# Patient Record
Sex: Female | Born: 1935 | Race: White | Hispanic: No | Marital: Single | Smoking: Former smoker
Health system: Southern US, Community
[De-identification: ages and names within clinical notes are randomized; demographics above are authoritative.]

## PROBLEM LIST (undated history)

## (undated) DIAGNOSIS — I209 Angina pectoris, unspecified: Secondary | ICD-10-CM

## (undated) DIAGNOSIS — R0600 Dyspnea, unspecified: Secondary | ICD-10-CM

## (undated) DIAGNOSIS — M1990 Unspecified osteoarthritis, unspecified site: Secondary | ICD-10-CM

## (undated) DIAGNOSIS — E079 Disorder of thyroid, unspecified: Secondary | ICD-10-CM

## (undated) DIAGNOSIS — I499 Cardiac arrhythmia, unspecified: Secondary | ICD-10-CM

## (undated) DIAGNOSIS — I10 Essential (primary) hypertension: Secondary | ICD-10-CM

## (undated) DIAGNOSIS — D649 Anemia, unspecified: Secondary | ICD-10-CM

## (undated) DIAGNOSIS — E039 Hypothyroidism, unspecified: Secondary | ICD-10-CM

## (undated) DIAGNOSIS — C801 Malignant (primary) neoplasm, unspecified: Secondary | ICD-10-CM

## (undated) DIAGNOSIS — I509 Heart failure, unspecified: Secondary | ICD-10-CM

## (undated) HISTORY — PX: FRACTURE SURGERY: SHX138

## (undated) HISTORY — PX: TONSILLECTOMY: SUR1361

## (undated) HISTORY — PX: CATARACT EXTRACTION, BILATERAL: SHX1313

## (undated) HISTORY — PX: JOINT REPLACEMENT: SHX530

## (undated) HISTORY — PX: HIP SURGERY: SHX245

---

## 2000-09-13 ENCOUNTER — Ambulatory Visit (HOSPITAL_COMMUNITY): Admission: RE | Admit: 2000-09-13 | Discharge: 2000-09-13 | Payer: Self-pay | Admitting: Internal Medicine

## 2000-09-24 ENCOUNTER — Inpatient Hospital Stay (HOSPITAL_COMMUNITY): Admission: EM | Admit: 2000-09-24 | Discharge: 2000-09-26 | Payer: Self-pay | Admitting: Infectious Diseases

## 2000-10-04 ENCOUNTER — Encounter: Admission: RE | Admit: 2000-10-04 | Discharge: 2000-10-04 | Payer: Self-pay | Admitting: Internal Medicine

## 2004-05-26 ENCOUNTER — Ambulatory Visit: Payer: Self-pay | Admitting: Physical Medicine & Rehabilitation

## 2004-05-26 ENCOUNTER — Inpatient Hospital Stay (HOSPITAL_COMMUNITY)
Admission: RE | Admit: 2004-05-26 | Discharge: 2004-06-01 | Payer: Self-pay | Admitting: Physical Medicine & Rehabilitation

## 2009-01-10 ENCOUNTER — Emergency Department (HOSPITAL_COMMUNITY): Admission: EM | Admit: 2009-01-10 | Discharge: 2009-01-10 | Payer: Self-pay | Admitting: Emergency Medicine

## 2010-06-04 NOTE — Discharge Summary (Signed)
[place]. [facility]  Patient:    Kathleen Arias, Kathleen Arias Visit Number: [id number] MRN: [id number]          Service Type: MED Location: 2000 2031 01 Attending Physician:  Levy Sjogren Dictated by:   Kerrie Pleasure, M.D. Admit Date:  09/24/2000 Discharge Date: 09/26/2000   CC:         Juluis Mire, M.D.   Discharge Summary  DISCHARGE DIAGNOSIS:  Seizure with deep venous thrombosis.  DISCHARGE MEDICATIONS: 1. Synthroid 0.2 mg q.d. 2. Coumadin 5 mg q.d. 3. Aspirin 325 mg q.d.  DISCHARGE FOLLOWUP:  Patient was discharged in good health.  She was continued on her Coumadin 5 mg daily, which she came into the hospital with.  She was discharged to continue care with primary care physician, Dr. Ellene Route, who will continue checking her INR and PTT and will adjust her Coumadin levels accordingly.  She was also discharged to have a followup with me in the outpatient clinic.  PROCEDURES PERFORMED:  None.  CONSULTATIONS:  None.  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  She has been previously until admission week when she had left knee pain and swelling, which was said to have been diagnosed as a DVT.  She was placed on Coumadin and Lovenox by Dr. Ellene Route.  She completed the Lovenox on Friday, two days prior to admission, but still was on Coumadin.  She has also been hypothyroid and on 0.12 of Synthroid x 1964.  Her Synthroid dose was doubled a week before admission.  Her episode on the day of admission lasted three minutes and was described by family members as convulsive, with loss of expression and pulse.  CPR was performed by bystanders in church and patient was disoriented post episode. The patient had no idea of what happened.  She had no biting of tongue, no frothy mouth, no  loss of bladder or rectal tone, no fever, no diaphoresis, and no chest pain.  No nausea and vomiting.  The patient admitted to being under a lot of stress lately.  PAST MEDICAL HISTORY:  Her past medical history is only significant for hypothyroidism since 1964 and the DVT a week prior to admission.  ALLERGIES:  No known drug allergies.  MEDICATIONS:  Her medications are basically: 1. Synthroid 0.2 mg q.d. 2. Coumadin 5 mg daily. 3. Aspirin 325 mg daily.  FAMILY HISTORY:  Her mom died at the age of 11 with MI secondary to rheumatic heart disease.  Her dad died of natural causes.  Her husband had cancer.  Her son is ______ .  She has four kids, eight grandchildren, and 12 great-grandchildren.  SOCIAL HISTORY:  She denied any alcohol, tobacco, or drug use.  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  She is retired, but she has been very active in the community.  REVIEW OF SYSTEMS:  Noncontributory, except for slight discoloration at the site of Lovenox injection on her abdomen.  PHYSICAL EXAMINATION:  VITAL SIGNS:  Her vitals on admission, temperature 97.9, blood pressure 176/78, pulse 92, respirations 18, O2 saturation 94 on room air.  The vitals as recorded by EMS before transportation to hospital, blood pressure 190/110, pulse 86, respirations 16.  GENERAL:  On examination, she is generally a pleasant elderly female, no in obvious pain or distress.  She is well  informed on medical language.  HEENT:  PERRLA.  No pallor or icterus.  CHEST EXAM:  Clear to auscultation symmetrical.  CARDIOVASCULAR:  Heart sounds S1 and S2 are normal.  ABDOMEN:  She had soft, ______ in both flanks.  She had soft abdomen with positive bowel sounds.  EXTREMITIES:  She had some superficial veins in the bilateral left lower extremity, but no swelling of joints or soft tissue.  She had no calf tenderness or edema.  She had positive DP pulses bilaterally.  NEUROLOGICAL:  Cranial nerves 2-12 were  intact.  Normal cerebellar function. No focal neurological deficits detected.  LABORATORY DATA:  Her labs on admission showed CK 56, CK-MB 1.4, and troponin of 0.02.  She also had PT of 23.3, INR 2.5, PTT 48.  Her EKG showed left bundle branch block with left axis deviation.  Chest spiral CT was negative and showed that there was no DVT or PE.  Her white count was 7.2, platelets 361, hemoglobin 13.  HOSPITAL COURSE: #1 - LOSS OF CONSCIOUSNESS:  The patient presented with loss of consciousness or syncope, which was thought to be vasovagal based on the history and physical.  She did not have any repeat episodes during hospitalization. Considered other differentials including MI, CNS tumor, or metabolic causes. For the MI, we ordered serial enzymes on patient which all turned out negative x 3.  We ordered a 2-D echocardiogram and EKG telemonitoring.  Her echocardiogram came back also negative with EF of 55-65% and normal cardiac findings.  #2 - HYPOTHYROIDISM:  The patient has been taking her Synthroid since 1964 and she just had her dose of Synthroid increased.  We checked a TSH level while in the hospital and her TSH level was 3.228, which is normal.  #3 - HYPERTENSION:  She also had a history of hypertension, probably isolated systolic hypertension on admission. This went up to 170/70.  We did not give her any medication for that because that resolved and went down to 156/70 on the day of discharge, so we did not treat that.  During the course of hospitalization, the patient did very well.  A Doppler ultrasound of the lower extremities also showed no DVT.  As such, the patient was discharged in good health with the following discharge laboratories:  On the day of discharge, she had a PT of 24.7, INR of 2.9.  The patient also had a white count of 7.8, hemoglobin 13.2, platelets 379.  She had a sodium of  1.24, potassium 4.2, chloride 111, CO2 27, BUN 8, creatinine 0.7, and  glucose of 95 on the day of discharge.  The patient was discharged to have followup visit to Dr. Ellene Route. Dictated by:   Kerrie Pleasure, M.D. Attending Physician:  Levy Sjogren DD:  11/02/00 TD:  11/04/00 Job: [id number]

## 2010-06-04 NOTE — H&P (Signed)
[name], [name] NO.:  [id number]   MEDICAL RECORD NO.:  [id number]          PATIENT TYPE:  IPS   LOCATION:  4008                         FACILITY:  [facility]   PHYSICIAN:  Ranelle Oyster, M.D.DATE OF BIRTH:  01-11-1926   DATE OF ADMISSION:  05/26/2004  DATE OF DISCHARGE:                                HISTORY & PHYSICAL   CHIEF COMPLAINT:  Left hip pain.   HISTORY OF PRESENT ILLNESS:  This is a 77 year old white female vacationing  in [place] when she fell sustaining a left subcapital hip fracture.  She had a left hip pinning performed in a hospital in [facility] on May 22, 2004.  She is weight-bearing as tolerated.  She was placed on Coumadin for  DVT prophylaxis.  The patient generally appears to be progressing nicely but  not to the level where she can go home independently.  The patient is noted  to have some postoperative diarrhea felt possibly due to antibiotics.  She  also has lactose intolerance.   REVIEW OF SYMPTOMS:  Right biceps pain and soreness, bruising over the left  hip with improvement of late.  The patient reports diarrhea and mild  anxiety.   PAST MEDICAL HISTORY:  Positive for hypothyroidism, hypertension, syncope  due to vasovagal episode in 2002, history of left lower extremity DVT.   FAMILY HISTORY:  Noncontributory.   SOCIAL HISTORY:  The patient lives alone and has a local family who can  assist.  She is independent prior to arrival.   FUNCTIONAL HISTORY:  The patient is independent, currently, she is contact  guard to min assist for transfers and ambulating 175 feet with a rolling  walker.   MEDICATIONS PRIOR TO ARRIVAL:  Synthroid 200 mcg daily, aspirin 81 mg daily,  multi-vitamin daily.   ALLERGIES:  Benadryl.   LABORATORY DATA:  Hemoglobin 12.2, white count 11.3, sodium 147, potassium  3.7, BUN 12, creatinine 0.6.   PHYSICAL EXAMINATION:  VITAL SIGNS:  Blood pressure 120/47, pulse 84, respiratory rate 16.  GENERAL:  The patient is pleasant and appropriate.  HEENT:  Pupils equal, round, reactive to light and accommodation.  Extraocular movements intact.  Oral mucosa is pink and moist.  NECK:  Supple without JVD or adenopathy.  CHEST:  Clear to auscultation bilaterally.  HEART:  Regular rate and rhythm without murmurs, gallops, and rubs.  ABDOMEN:  Soft, nontender, bowel sounds positive.  EXTREMITIES:  Left hip had notable bruising but is without drainage, clean  and intact.  Staples are in place.  Right biceps was slightly tender after  blood pressure reading today due to the cuff being tight.  There was no  bruising or skin color changes noted.  The patient had 5/5 strength in both  upper extremities.  The left lower extremity was 2/5, distal lower extremity  4+/5.  Right lower extremity was 5/5 throughout.  Sensory exam was grossly  intact.  SKIN:  Appropriate in the wound area mentioned above.  NEUROLOGICAL:  Reflexes 2+.  Sensory intact.  Cognitively, the patient is  appropriate.   ASSESSMENT/PLAN:  1.  Functional deficit secondary to left subcapital femur fracture and left      hip hemiarthroplasty.  The patient is postoperative day five. Begin      comprehensive inpatient rehab.  Goals are modified independent.      Estimated length of stay is five days.  Prognosis good.  2.  Pain management.  P.r.n. Vicodin and Robaxin.  Apply moist heat to right      biceps.  3.  DVT prophylaxis with Coumadin.  4.  Diarrhea most likely due to lactose intolerance, provide lactate with      dairy products, check C. diff in stool, may be also related to      antibiotics which have now been discontinued.  The patient has done well      in this area today.  5.  Hypothyroidism, continue Synthroid.  6.  Anemia, continue iron supplement daily.      ZTS/MEDQ  D:  05/26/2004  T:  05/26/2004  Job:  [id number]

## 2010-06-04 NOTE — Discharge Summary (Signed)
NAMEPAULLETTE, Kathleen Arias NO.:  [id number]   MEDICAL RECORD NO.:  [id number]          PATIENT TYPE:  IPS   LOCATION:  4008                         FACILITY:  [facility]   PHYSICIAN:  Ellwood Dense, M.D.   DATE OF BIRTH:  1925/03/13   DATE OF ADMISSION:  05/26/2004  DATE OF DISCHARGE:  06/01/2004                                 DISCHARGE SUMMARY   DISCHARGE DIAGNOSES:  1.  Left subcapital femur fracture requiring left hip hemiarthroplasty.  2.  Hypothyroidism.  3.  Postoperative anemia, improving.   HISTORY OF PRESENT ILLNESS:  Ms. Kathleen Arias is a 77 year old female with a  history of hypothyroidism otherwise in good health was in [place]  dancing when she fell, on May 21, 2004, sustaining a left subcapital femur  fracture.  She was transferred to [facility] and underwent left  hip hemiarthroplasty by Dr. Karen Kitchens on May 22, 2004.  Post-op is weightbearing  as tolerated and was placed on Coumadin for DVT prophylaxis.  She was noted  to have post-op anemia past surgery and was placed on iron supplement.  She  was also noted to be hypertensive and was started on a low dose beta-  blocker.  Therapies were initiated and the patient noted to be at contact  guard to min assist for transfers, contact guard assist for ambulating 25  feet with a walker.  __________ was consulted progression.   PAST MEDICAL HISTORY:  1.  Hypothyroidism.  2.  Hypertension.  3.  Syncope due to vasovagal episode.  4.  History of DVT in the left lower extremity .   ALLERGIES:  BENADRYL.   SOCIAL HISTORY:  The patient lives alone, was independent prior to  admission, does not use any tobacco or alcohol.   HOSPITAL COURSE:  Ms. Kathleen Arias was admitted to rehab, on May 26, 2004,  for inpatient therapies to consist of PT OT daily.  Past admission she was  maintained on Coumadin for DVT prophylaxis.  Pain control was managed with  the use of p.r.n. Vicodin and Robaxin.  She did report  some diarrhea,  however, question secondary to antibiotics with the iron supplement.  Iron  supplement was D/C'd, on May 27, 2004, due to the patient's refusal to take  it and also as post-op anemia was noted to be improving.  CBC, of May 27, 2004, which revealed a hemoglobin 11.0, hematocrit 31.3, white count 6.0,  platelets 397.  A check of labs reveals some hypokalemia with potassium at  2.9.  The patient's potassium was supplemented and recheck labs, of May 30, 2004, showed resolution with sodium 141, potassium 4.1, chloride 106, CO2  31, BUN 8, creatinine 0.8, glucose 97.  A UA/UCS was done past admission  showing 30,000 colonies of E. coli and Pseudomonas aeruginosa as patient  with hardware she was treated with three day course of Cipro for this.  The  patient's hip incision was monitored along and was noted to be healing well  without any signs or symptoms of infection.  No drainage or erythema noted.  The patient's  mood was stable.  Her high levels of anxiety improved by the  time of discharge.  During her stay at rehab, the patient progressed to  being at modified independent level for ADLs and toileting.  She was  modified independent level for transfers.  Modified independent level for  ambulating 150 feet with standard walker.   Further followup therapies to include:  1.  Home health PT by [facility].  2.  Home health RN was arranged to draw pro time with Coumadin to continue      through June 22, 2004.  3.  The patient and family report they will follow up with orthopedist in      town as opposed to returning back to [place].  They will decide      on an orthopedist depending on the medical doctor's input.  They are      advised to follow up with an orthopedist in 2-3 weeks for recheck.  On      Jun 01, 2004, the patient is discharged to home.   DISCHARGE MEDICATIONS:  1.  Coumadin 2 mg two p.o. q.p.m.  2.  Synthroid 200 mcg a day.  3.  Toprol XL 25 mg a  day.  4.  Vicodin 1-2 q.4-6h. p.r.n. pain.  5.  Cipro 250 mg b.i.d.   DIET:  Regular.   WOUND CARE:  Keep area clean and dry.  Wash with soap and water.   SPECIAL INSTRUCTIONS:  1.  No alcohol, no tobacco, no driving.  2.  Follow up with an orthopedist in 2-3 weeks.  3.  Follow total hip precautions and use the walker for ambulation.  4.  [facility] to provide PT and RN.   FOLLOWUP:  1.  The patient is to follow up with Dr. Andrey Campanile for a routine check.  2.  Follow up with Dr. Lamar Arias as needed.      PP/MEDQ  D:  06/02/2004  T:  06/02/2004  Job:  [id number]   cc:   Gloriajean Dell. Andrey Campanile, M.D.  [id number]  [place]  [place] [address]  Fax: [phone]   Dr. Jackalyn Lombard [place]

## 2010-08-21 ENCOUNTER — Emergency Department (HOSPITAL_COMMUNITY): Payer: Medicare Other

## 2010-08-21 ENCOUNTER — Emergency Department (HOSPITAL_COMMUNITY)
Admission: EM | Admit: 2010-08-21 | Discharge: 2010-08-21 | Disposition: A | Discharge status: Home / Self Care | Payer: Medicare Other | Attending: Emergency Medicine | Admitting: Emergency Medicine

## 2010-08-21 DIAGNOSIS — S0990XA Unspecified injury of head, initial encounter: Secondary | ICD-10-CM | POA: Insufficient documentation

## 2010-08-21 DIAGNOSIS — S0100XA Unspecified open wound of scalp, initial encounter: Secondary | ICD-10-CM | POA: Insufficient documentation

## 2010-08-21 DIAGNOSIS — W1809XA Striking against other object with subsequent fall, initial encounter: Secondary | ICD-10-CM | POA: Insufficient documentation

## 2010-08-21 DIAGNOSIS — R21 Rash and other nonspecific skin eruption: Secondary | ICD-10-CM | POA: Insufficient documentation

## 2010-08-21 DIAGNOSIS — R1110 Vomiting, unspecified: Secondary | ICD-10-CM | POA: Insufficient documentation

## 2010-08-21 DIAGNOSIS — E039 Hypothyroidism, unspecified: Secondary | ICD-10-CM | POA: Insufficient documentation

## 2010-08-21 DIAGNOSIS — L509 Urticaria, unspecified: Secondary | ICD-10-CM | POA: Insufficient documentation

## 2010-08-21 DIAGNOSIS — F411 Generalized anxiety disorder: Secondary | ICD-10-CM | POA: Insufficient documentation

## 2010-08-21 DIAGNOSIS — Z79899 Other long term (current) drug therapy: Secondary | ICD-10-CM | POA: Insufficient documentation

## 2010-08-21 DIAGNOSIS — L298 Other pruritus: Secondary | ICD-10-CM | POA: Insufficient documentation

## 2010-08-21 DIAGNOSIS — I10 Essential (primary) hypertension: Secondary | ICD-10-CM | POA: Insufficient documentation

## 2010-08-21 DIAGNOSIS — L2989 Other pruritus: Secondary | ICD-10-CM | POA: Insufficient documentation

## 2010-08-21 LAB — DIFFERENTIAL
Basophils Absolute: 0 10*3/uL (ref 0.0–0.1)
Basophils Relative: 0 % (ref 0–1)
Eosinophils Absolute: 0 10*3/uL (ref 0.0–0.7)
Eosinophils Relative: 0 % (ref 0–5)
Lymphocytes Relative: 6 % — ABNORMAL LOW (ref 12–46)
Lymphs Abs: 0.7 10*3/uL (ref 0.7–4.0)
Monocytes Absolute: 0.4 10*3/uL (ref 0.1–1.0)
Monocytes Relative: 3 % (ref 3–12)
Neutro Abs: 11.7 10*3/uL — ABNORMAL HIGH (ref 1.7–7.7)
Neutrophils Relative %: 91 % — ABNORMAL HIGH (ref 43–77)

## 2010-08-21 LAB — POCT I-STAT, CHEM 8
Calcium, Ion: 1.22 mmol/L (ref 1.12–1.32)
Chloride: 98 mEq/L (ref 96–112)
HCT: 43 % (ref 36.0–46.0)
TCO2: 25 mmol/L (ref 0–100)

## 2010-08-21 LAB — URINALYSIS, ROUTINE W REFLEX MICROSCOPIC
Ketones, ur: NEGATIVE mg/dL
Nitrite: NEGATIVE
Protein, ur: NEGATIVE mg/dL
Urobilinogen, UA: 0.2 mg/dL (ref 0.0–1.0)
pH: 6 (ref 5.0–8.0)

## 2010-08-21 LAB — CBC
MCV: 80.6 fL (ref 78.0–100.0)
Platelets: 263 10*3/uL (ref 150–400)
RDW: 13.5 % (ref 11.5–15.5)
WBC: 12.8 10*3/uL — ABNORMAL HIGH (ref 4.0–10.5)

## 2010-08-22 LAB — URINE CULTURE
Colony Count: 85000
Culture  Setup Time: 201208041755

## 2016-01-05 ENCOUNTER — Observation Stay (HOSPITAL_BASED_OUTPATIENT_CLINIC_OR_DEPARTMENT_OTHER): Payer: Medicare Other

## 2016-01-05 ENCOUNTER — Inpatient Hospital Stay (HOSPITAL_COMMUNITY)
Admission: EM | Admit: 2016-01-05 | Discharge: 2016-01-08 | DRG: 287 | Disposition: A | Discharge status: Home / Self Care | Payer: Medicare Other | Attending: Internal Medicine | Admitting: Internal Medicine

## 2016-01-05 ENCOUNTER — Emergency Department (HOSPITAL_COMMUNITY): Payer: Medicare Other

## 2016-01-05 ENCOUNTER — Encounter (HOSPITAL_COMMUNITY): Payer: Self-pay | Admitting: Emergency Medicine

## 2016-01-05 DIAGNOSIS — I509 Heart failure, unspecified: Secondary | ICD-10-CM | POA: Diagnosis not present

## 2016-01-05 DIAGNOSIS — E876 Hypokalemia: Secondary | ICD-10-CM | POA: Diagnosis not present

## 2016-01-05 DIAGNOSIS — Z23 Encounter for immunization: Secondary | ICD-10-CM

## 2016-01-05 DIAGNOSIS — R0789 Other chest pain: Secondary | ICD-10-CM | POA: Diagnosis not present

## 2016-01-05 DIAGNOSIS — R609 Edema, unspecified: Secondary | ICD-10-CM | POA: Diagnosis not present

## 2016-01-05 DIAGNOSIS — Z7982 Long term (current) use of aspirin: Secondary | ICD-10-CM

## 2016-01-05 DIAGNOSIS — E162 Hypoglycemia, unspecified: Secondary | ICD-10-CM | POA: Diagnosis present

## 2016-01-05 DIAGNOSIS — R071 Chest pain on breathing: Secondary | ICD-10-CM | POA: Diagnosis not present

## 2016-01-05 DIAGNOSIS — D649 Anemia, unspecified: Secondary | ICD-10-CM | POA: Diagnosis not present

## 2016-01-05 DIAGNOSIS — Z87891 Personal history of nicotine dependence: Secondary | ICD-10-CM

## 2016-01-05 DIAGNOSIS — R739 Hyperglycemia, unspecified: Secondary | ICD-10-CM

## 2016-01-05 DIAGNOSIS — I110 Hypertensive heart disease with heart failure: Secondary | ICD-10-CM | POA: Diagnosis present

## 2016-01-05 DIAGNOSIS — T502X5A Adverse effect of carbonic-anhydrase inhibitors, benzothiadiazides and other diuretics, initial encounter: Secondary | ICD-10-CM | POA: Diagnosis present

## 2016-01-05 DIAGNOSIS — R0602 Shortness of breath: Secondary | ICD-10-CM

## 2016-01-05 DIAGNOSIS — M7989 Other specified soft tissue disorders: Secondary | ICD-10-CM

## 2016-01-05 DIAGNOSIS — I5042 Chronic combined systolic (congestive) and diastolic (congestive) heart failure: Secondary | ICD-10-CM | POA: Diagnosis present

## 2016-01-05 DIAGNOSIS — I428 Other cardiomyopathies: Secondary | ICD-10-CM | POA: Diagnosis present

## 2016-01-05 DIAGNOSIS — I447 Left bundle-branch block, unspecified: Secondary | ICD-10-CM | POA: Diagnosis present

## 2016-01-05 DIAGNOSIS — R079 Chest pain, unspecified: Secondary | ICD-10-CM | POA: Diagnosis present

## 2016-01-05 DIAGNOSIS — E871 Hypo-osmolality and hyponatremia: Secondary | ICD-10-CM

## 2016-01-05 DIAGNOSIS — E039 Hypothyroidism, unspecified: Secondary | ICD-10-CM | POA: Diagnosis present

## 2016-01-05 DIAGNOSIS — Z79899 Other long term (current) drug therapy: Secondary | ICD-10-CM

## 2016-01-05 DIAGNOSIS — D509 Iron deficiency anemia, unspecified: Secondary | ICD-10-CM

## 2016-01-05 DIAGNOSIS — R9439 Abnormal result of other cardiovascular function study: Secondary | ICD-10-CM

## 2016-01-05 DIAGNOSIS — I5181 Takotsubo syndrome: Secondary | ICD-10-CM | POA: Diagnosis not present

## 2016-01-05 DIAGNOSIS — M7918 Myalgia, other site: Secondary | ICD-10-CM

## 2016-01-05 DIAGNOSIS — M791 Myalgia: Secondary | ICD-10-CM

## 2016-01-05 HISTORY — DX: Essential (primary) hypertension: I10

## 2016-01-05 HISTORY — DX: Disorder of thyroid, unspecified: E07.9

## 2016-01-05 HISTORY — DX: Unspecified osteoarthritis, unspecified site: M19.90

## 2016-01-05 LAB — BASIC METABOLIC PANEL
ANION GAP: 10 (ref 5–15)
Anion gap: 8 (ref 5–15)
BUN: 13 mg/dL (ref 6–20)
BUN: 9 mg/dL (ref 6–20)
CALCIUM: 9.3 mg/dL (ref 8.9–10.3)
CHLORIDE: 91 mmol/L — AB (ref 101–111)
CHLORIDE: 91 mmol/L — AB (ref 101–111)
CO2: 25 mmol/L (ref 22–32)
CO2: 28 mmol/L (ref 22–32)
CREATININE: 0.74 mg/dL (ref 0.44–1.00)
CREATININE: 0.84 mg/dL (ref 0.44–1.00)
Calcium: 8.8 mg/dL — ABNORMAL LOW (ref 8.9–10.3)
GFR calc Af Amer: >60 mL/min (ref >60)
GFR calc non Af Amer: >60 mL/min (ref >60)
GFR calc non Af Amer: >60 mL/min (ref >60)
GLUCOSE: 121 mg/dL — AB (ref 65–99)
Glucose, Bld: 131 mg/dL — ABNORMAL HIGH (ref 65–99)
Potassium: 4 mmol/L (ref 3.5–5.1)
Potassium: 3.9 mmol/L (ref 3.5–5.1)
SODIUM: 126 mmol/L — AB (ref 135–145)
SODIUM: 127 mmol/L — AB (ref 135–145)

## 2016-01-05 LAB — CBC
HCT: 31.5 % — ABNORMAL LOW (ref 36.0–46.0)
HEMOGLOBIN: 10.1 g/dL — AB (ref 12.0–15.0)
MCH: 25.1 pg — AB (ref 26.0–34.0)
MCHC: 32.1 g/dL (ref 30.0–36.0)
MCV: 78.2 fL (ref 78.0–100.0)
PLATELETS: 154 10*3/uL (ref 150–400)
RBC: 4.03 MIL/uL (ref 3.87–5.11)
RDW: 15.9 % — ABNORMAL HIGH (ref 11.5–15.5)
WBC: 11.6 10*3/uL — AB (ref 4.0–10.5)

## 2016-01-05 LAB — I-STAT TROPONIN, ED: TROPONIN I, POC: 0 ng/mL (ref 0.00–0.08)

## 2016-01-05 LAB — ECHOCARDIOGRAM COMPLETE
HEIGHTINCHES: 63 in
Weight: 2227.2 oz

## 2016-01-05 LAB — TROPONIN I
Troponin I: <0.03 ng/mL (ref <0.03)
Troponin I: <0.03 ng/mL (ref <0.03)

## 2016-01-05 LAB — GLUCOSE, CAPILLARY: GLUCOSE-CAPILLARY: 87 mg/dL (ref 65–99)

## 2016-01-05 LAB — TSH: TSH: 3.156 u[IU]/mL (ref 0.350–4.500)

## 2016-01-05 LAB — BRAIN NATRIURETIC PEPTIDE: B Natriuretic Peptide: 450.1 pg/mL — ABNORMAL HIGH (ref 0.0–100.0)

## 2016-01-05 MED ORDER — HEPARIN SODIUM (PORCINE) 5000 UNIT/ML IJ SOLN
5000.0000 [IU] | Freq: Three times a day (TID) | INTRAMUSCULAR | Status: DC
  Administered 2016-01-05 – 2016-01-07 (×4): 5000 [IU] via SUBCUTANEOUS
  Filled 2016-01-05 (×4): qty 1

## 2016-01-05 MED ORDER — POTASSIUM 99 MG PO TABS: 99.0000 mg | ORAL_TABLET | ORAL | Status: DC

## 2016-01-05 MED ORDER — FUROSEMIDE 10 MG/ML IJ SOLN
20.0000 mg | Freq: Once | INTRAMUSCULAR | Status: AC
  Administered 2016-01-05: 20 mg via INTRAVENOUS
  Filled 2016-01-05: qty 2

## 2016-01-05 MED ORDER — PERFLUTREN LIPID MICROSPHERE
1.0000 mL | INTRAVENOUS | Status: AC | PRN
  Administered 2016-01-05: 3 mL via INTRAVENOUS
  Filled 2016-01-05: qty 10

## 2016-01-05 MED ORDER — GI COCKTAIL ~~LOC~~: 30.0000 mL | Freq: Four times a day (QID) | ORAL | Status: DC | PRN

## 2016-01-05 MED ORDER — HEPARIN SODIUM (PORCINE) 5000 UNIT/ML IJ SOLN: 5000.0000 [IU] | Freq: Three times a day (TID) | INTRAMUSCULAR | Status: DC

## 2016-01-05 MED ORDER — ACETAMINOPHEN 650 MG RE SUPP: 650.0000 mg | Freq: Four times a day (QID) | RECTAL | Status: DC | PRN

## 2016-01-05 MED ORDER — INFLUENZA VAC SPLIT QUAD 0.5 ML IM SUSY
0.5000 mL | PREFILLED_SYRINGE | INTRAMUSCULAR | Status: AC
  Administered 2016-01-06: 0.5 mL via INTRAMUSCULAR
  Filled 2016-01-05: qty 0.5

## 2016-01-05 MED ORDER — ONDANSETRON HCL 4 MG/2ML IJ SOLN: 4.0000 mg | Freq: Four times a day (QID) | INTRAMUSCULAR | Status: DC | PRN

## 2016-01-05 MED ORDER — POLYVINYL ALCOHOL 1.4 % OP SOLN
1.0000 [drp] | OPHTHALMIC | Status: DC | PRN
  Filled 2016-01-05: qty 15

## 2016-01-05 MED ORDER — NITROGLYCERIN 0.4 MG SL SUBL: 0.4000 mg | SUBLINGUAL_TABLET | SUBLINGUAL | Status: DC | PRN

## 2016-01-05 MED ORDER — ASPIRIN EC 325 MG PO TBEC
325.0000 mg | DELAYED_RELEASE_TABLET | Freq: Every day | ORAL | Status: DC
  Administered 2016-01-06 – 2016-01-08 (×2): 325 mg via ORAL
  Filled 2016-01-05 (×3): qty 1

## 2016-01-05 MED ORDER — LEVOTHYROXINE SODIUM 25 MCG PO TABS
125.0000 ug | ORAL_TABLET | ORAL | Status: DC
  Administered 2016-01-06 – 2016-01-08 (×2): 125 ug via ORAL
  Filled 2016-01-05 (×3): qty 1

## 2016-01-05 MED ORDER — NITROGLYCERIN 2 % TD OINT
1.0000 [in_us] | TOPICAL_OINTMENT | Freq: Once | TRANSDERMAL | Status: AC
  Administered 2016-01-05: 1 [in_us] via TOPICAL
  Filled 2016-01-05 (×2): qty 1

## 2016-01-05 MED ORDER — ASPIRIN 81 MG PO CHEW
324.0000 mg | CHEWABLE_TABLET | Freq: Once | ORAL | Status: AC
  Administered 2016-01-05: 324 mg via ORAL
  Filled 2016-01-05: qty 4

## 2016-01-05 MED ORDER — ONDANSETRON HCL 4 MG PO TABS: 4.0000 mg | ORAL_TABLET | Freq: Four times a day (QID) | ORAL | Status: DC | PRN

## 2016-01-05 MED ORDER — DICLOFENAC SODIUM 75 MG PO TBEC
75.0000 mg | DELAYED_RELEASE_TABLET | Freq: Two times a day (BID) | ORAL | Status: DC | PRN
  Filled 2016-01-05: qty 1

## 2016-01-05 MED ORDER — ACETAMINOPHEN 325 MG PO TABS: 650.0000 mg | ORAL_TABLET | ORAL | Status: DC | PRN

## 2016-01-05 MED ORDER — LEVOTHYROXINE SODIUM 112 MCG PO TABS
112.0000 ug | ORAL_TABLET | ORAL | Status: DC
Start: 2016-01-07 — End: 2016-01-08
  Administered 2016-01-07: 112 ug via ORAL
  Filled 2016-01-05 (×2): qty 1

## 2016-01-05 MED ORDER — LOSARTAN POTASSIUM 25 MG PO TABS
25.0000 mg | ORAL_TABLET | Freq: Every day | ORAL | Status: DC
  Administered 2016-01-05 – 2016-01-08 (×4): 25 mg via ORAL
  Filled 2016-01-05 (×4): qty 1

## 2016-01-05 MED ORDER — SODIUM CHLORIDE 0.9 % IV SOLN
INTRAVENOUS | Status: DC
  Administered 2016-01-05: 17:00:00 via INTRAVENOUS

## 2016-01-05 MED ORDER — REGADENOSON 0.4 MG/5ML IV SOLN
0.4000 mg | Freq: Once | INTRAVENOUS | Status: AC
  Administered 2016-01-06: 0.4 mg via INTRAVENOUS
  Filled 2016-01-05: qty 5

## 2016-01-05 MED ORDER — ACETAMINOPHEN 325 MG PO TABS: 650.0000 mg | ORAL_TABLET | Freq: Four times a day (QID) | ORAL | Status: DC | PRN

## 2016-01-05 NOTE — ED Notes (Signed)
Family at bedside. 

## 2016-01-05 NOTE — Progress Notes (Signed)
Received signout from Dr. Dina Rich Kathleen Arias is a 82 year old female with pmh of HTN; who presents with chest pain with radiation to the back, leg swelling, and reports of shortness of breath. Initial troponin negative. Sodium noted to be 126.  Patient may need further workup including echocardiogram and/or stress test. Admitted to the telemetry bed for chest pain rule out.

## 2016-01-05 NOTE — Progress Notes (Signed)
Echocardiogram 2D Echocardiogram has been performed with definity.  Kathleen Arias 01/05/2016, 12:01 PM

## 2016-01-05 NOTE — ED Notes (Signed)
ED Provider at bedside. 

## 2016-01-05 NOTE — Care Management Obs Status (Signed)
[place] NOTIFICATION   Patient Details  Name: Kathleen Arias MRN: [id number] Date of Birth: 06-03-1925   Medicare Observation Status Notification Given:  Yes    Bethena Roys, RN 01/05/2016, 4:02 PM

## 2016-01-05 NOTE — ED Notes (Signed)
EDP at bedside  

## 2016-01-05 NOTE — H&P (Signed)
History and Physical    Daylynn Bergstrand [id number] DOB: 1925-04-25 DOA: 01/05/2016  PCP: No primary care provider on file. Patient coming from: Home  Chief Complaint: CP  HPI: Raizel Perna is a 82 y.o. female with medical history significant of HTN and hypothyroidism presenting w/ a primary complaint of chest pain. Started several weeks ago. Heavy or pressure sensation. Getting worse. Initially intermittent but now constant. Radiation to shoulder blades. Not associated with meals. Associated with shortness of breath and bilateral lower extremity swelling and fatigue. Patient does become a little more dyspneic with significant exertion. Symptoms remain even with rest. Patient increased her home dose of hydrochlorothiazide 3 weeks ago,. Pain relieved when received nitro in ED.   Denies recent fevers, palpitations, rash, abdominal pain, nausea, vomiting, diarrhea, neck stiffness, headache, vertigo, dizziness.   ED Course: Objective findings outlined below. Chest pain now relieved after nitroglycerin   Review of Systems: As per HPI otherwise 10 point review of systems negative.   Ambulatory Status: Slow purposeful movements but unrestricted   Past Medical History:  Diagnosis Date  . Hypertension   . Thyroid disease     Past Surgical History:  Procedure Laterality Date  . HIP SURGERY      Social History   Social History  . Marital status: Single    Spouse name: N/A  . Number of children: N/A  . Years of education: N/A   Occupational History  . Not on file.   Social History Main Topics  . Smoking status: Former Research scientist (life sciences)  . Smokeless tobacco: Not on file  . Alcohol use No  . Drug use: No  . Sexual activity: Not on file   Other Topics Concern  . Not on file   Social History Narrative  . No narrative on file    No Known Allergies  Family History  Problem Relation Age of Onset  . Heart disease Mother     Prior to Admission medications   Medication Sig Start Date  End Date Taking? Authorizing Provider  amoxicillin (AMOXIL) 500 MG capsule Take 4 capsules by mouth See admin instructions. One hour prior to dental appt 11/25/15  Yes Historical Provider, MD  aspirin EC 325 MG tablet Take 325 mg by mouth every other day.   Yes Historical Provider, MD  B COMPLEX-C-CALCIUM PO Take 1 tablet by mouth daily.   Yes Historical Provider, MD  diclofenac (VOLTAREN) 75 MG EC tablet Take 75 mg by mouth daily as needed for pain. 10/20/15  Yes Historical Provider, MD  GINKGO BILOBA PO Take 1 capsule by mouth daily.   Yes Historical Provider, MD  hydrochlorothiazide (HYDRODIURIL) 25 MG tablet Take 12.5 mg by mouth daily. 10/07/15  Yes Historical Provider, MD  levothyroxine (SYNTHROID, LEVOTHROID) 112 MCG tablet Take 112 mcg by mouth See admin instructions. Sun/Tues/Thurs/Sat   Yes Historical Provider, MD  levothyroxine (SYNTHROID, LEVOTHROID) 125 MCG tablet Take 125 mcg by mouth every Monday, Wednesday, and Friday.  08/21/15  Yes Historical Provider, MD  meloxicam (MOBIC) 15 MG tablet Take 15 mg by mouth daily as needed for pain.    Yes Historical Provider, MD  POTASSIUM PO Take 99 mg by mouth 2 (two) times a week.   Yes Historical Provider, MD  vitamin E 1000 UNIT capsule Take 1,000 Units by mouth every other day.   Yes Historical Provider, MD    Physical Exam: Vitals:   01/05/16 0400 01/05/16 0430 01/05/16 0638 01/05/16 0732  BP: 141/67 145/72 (!) 154/68 (!) 158/61  Pulse:  78 75 86 73  Resp: 21 18 20 18   Temp:   97.8 F (36.6 C) 97.5 F (36.4 C)  TempSrc:   Oral Oral  SpO2: 98% 99% 96% 98%  Weight:   63.1 kg (139 lb 3.2 oz)   Height:   5\' 3"  (1.6 m)      General:  Appears calm and comfortable Eyes:  PERRL, EOMI, normal lids, iris ENT:  grossly normal hearing, lips & tongue, mmm Neck:  no LAD, masses or thyromegaly Cardiovascular:  RRR, no m/r/g. 1-2+_ RLE swelling and trace -1+ LLE swelling, no JVD Respiratory: few crackles in bases, nml effort.  Abdomen:  soft,  ntnd, NABS Skin:  no rash or induration seen on limited exam Musculoskeletal:  grossly normal tone BUE/BLE, good ROM, no bony abnormality Psychiatric:  grossly normal mood and affect, speech fluent and appropriate, AOx3 Neurologic:  CN 2-12 grossly intact, moves all extremities in coordinated fashion, sensation intact  Labs on Admission: I have personally reviewed following labs and imaging studies  CBC:  Recent Labs Lab 01/05/16 0037  WBC 11.6*  HGB 10.1*  HCT 31.5*  MCV 78.2  PLT 123456   Basic Metabolic Panel:  Recent Labs Lab 01/05/16 0037  NA 126*  K 4.0  CL 91*  CO2 25  GLUCOSE 131*  BUN 13  CREATININE 0.84  CALCIUM 9.3   GFR: Estimated Creatinine Clearance: 41.4 mL/min (by C-G formula based on SCr of 0.84 mg/dL). Liver Function Tests: No results for input(s): AST, ALT, ALKPHOS, BILITOT, PROT, ALBUMIN in the last 168 hours. No results for input(s): LIPASE, AMYLASE in the last 168 hours. No results for input(s): AMMONIA in the last 168 hours. Coagulation Profile: No results for input(s): INR, PROTIME in the last 168 hours. Cardiac Enzymes: No results for input(s): CKTOTAL, CKMB, CKMBINDEX, TROPONINI in the last 168 hours. BNP (last 3 results) No results for input(s): PROBNP in the last 8760 hours. HbA1C: No results for input(s): HGBA1C in the last 72 hours. CBG: No results for input(s): GLUCAP in the last 168 hours. Lipid Profile: No results for input(s): CHOL, HDL, LDLCALC, TRIG, CHOLHDL, LDLDIRECT in the last 72 hours. Thyroid Function Tests: No results for input(s): TSH, T4TOTAL, FREET4, T3FREE, THYROIDAB in the last 72 hours. Anemia Panel: No results for input(s): VITAMINB12, FOLATE, FERRITIN, TIBC, IRON, RETICCTPCT in the last 72 hours. Urine analysis:    Component Value Date/Time   COLORURINE YELLOW 08/21/2010 1229   APPEARANCEUR CLOUDY (A) 08/21/2010 1229   LABSPEC 1.025 08/21/2010 1229   PHURINE 6.0 08/21/2010 1229   GLUCOSEU NEGATIVE 08/21/2010  1229   HGBUR NEGATIVE 08/21/2010 1229   BILIRUBINUR NEGATIVE 08/21/2010 1229   KETONESUR NEGATIVE 08/21/2010 1229   PROTEINUR NEGATIVE 08/21/2010 1229   UROBILINOGEN 0.2 08/21/2010 1229   NITRITE NEGATIVE 08/21/2010 1229   LEUKOCYTESUR LARGE (A) 08/21/2010 1229    Creatinine Clearance: Estimated Creatinine Clearance: 41.4 mL/min (by C-G formula based on SCr of 0.84 mg/dL).  Sepsis Labs: @LABRCNTIP (procalcitonin:4,lacticidven:4) )No results found for this or any previous visit (from the past 240 hour(s)).   Radiological Exams on Admission: Dg Chest 2 View  Result Date: 01/05/2016 CLINICAL DATA:  Chest pain and lower extremity swelling. Dyspnea for 1 week. EXAM: CHEST  2 VIEW COMPARISON:  None. FINDINGS: The lungs are clear. The pulmonary vasculature is normal. There is no pleural effusion. There is borderline heart size. Hilar and mediastinal contours are unremarkable. There is chronic compression of a lower thoracic vertebral body. IMPRESSION: No acute findings.  Electronically Signed   By: Andreas Newport M.D.   On: 01/05/2016 01:34    EKG: Independently reviewed. Sinus, LBBB, no ACS   Assessment/Plan Active Problems:   Chest pain   Shortness of breath   Localized swelling of lower extremity   Hyponatremia   Hyperglycemia   Musculoskeletal pain   CP: cardiac vs pleuritic vs stress/anxiety vs msk. Possibly w/ early and undiagnosed CHF. No previous cardiac workup. Improved w/ nitro. EKG w/ LBBB but no signs of ACS. Trop nml. CXR w/o acute process. - cycle trop - Tele - ASA, - Nitro prn - EKG in am - Echo, BNP - defer stress test vs cath to cards (will make NPO after midnight) - f/u Cards recs  LE swelling:venous insufficiency, vs CHF, vs DVT. H/o DVT in distant past. R>L. - Venous duplex - Echo - Compression stockings if Duplex neative for DVT.  Hyponatremia: 126 on admission. Typically in nml range. Likely from self treating w/ triple prescribed dose of HCTZ and low  sodium intake.  - serum osmolality, urine osmolality, urine Na, Urine cr - NS IVF - 50cc/hr - free water restriction to 1.2L - hold HCTZ (will need order to resume when appropriate).  Hyperglycemia: 131. No h/o DM - A1c - CBG QACHS  Hypthyroid: - continue synthroid  HTN: - Initiate ARB per cards - Hydralazine prn  MSK pain:  - continue home voltaren   DVT prophylaxis: Hep  Code Status: full  Family Communication: daughters  Disposition Plan: pending workup  Consults called: Cardiology  Admission status: observation    Thalia Turkington J MD [facility]  If 7PM-7AM, please contact night-coverage [email or web address] Password [email or web address]  01/05/2016, 11:21 AM

## 2016-01-05 NOTE — Consult Note (Signed)
CARDIOLOGY CONSULT NOTE       Patient ID: Kathleen Arias MRN: [id number] DOB/AGE: 1925/09/18 82 y.o.  Admit date: 01/05/2016 Referring Physician: Marily Memos Primary Physician: No primary care provider on file. Primary Cardiologist: New Reason for Consultation: CHF  Active Problems:   Chest pain   HPI:  82 y.o. no previous CAD or heart disease. CRF's HTN.  Had increasing dyspnea since March/April. Started taking More of her HCTZ. Has had LE edema since left hip replacement with post of DVT. However LE edema in feet worse Last few weeks. Started getting some SSCP last week or so. Also noticed her heart skipping some beats .  No cough Fever sputum No other infectious signs Central pain in chest not related to food. CRF;s just HTN  This am some dyspnea no chest pain  Troponin still pending ECG with LBBB Telemetry with PaC's   ROS All other systems reviewed and negative except as noted above  Past Medical History:  Diagnosis Date  . Hypertension   . Thyroid disease     Family History  Problem Relation Age of Onset  . Heart disease Mother     Social History   Social History  . Marital status: Single    Spouse name: N/A  . Number of children: N/A  . Years of education: N/A   Occupational History  . Not on file.   Social History Main Topics  . Smoking status: Former Research scientist (life sciences)  . Smokeless tobacco: Not on file  . Alcohol use No  . Drug use: No  . Sexual activity: Not on file   Other Topics Concern  . Not on file   Social History Narrative  . No narrative on file    Past Surgical History:  Procedure Laterality Date  . HIP SURGERY          Physical Exam: Blood pressure (!) 158/61, pulse 73, temperature 97.5 F (36.4 C), temperature source Oral, resp. rate 18, height 5\' 3"  (1.6 m), weight 139 lb 3.2 oz (63.1 kg), SpO2 98 %.    Affect appropriate Healthy:  appears stated age 61: normal Neck supple with no adenopathy JVP normal no bruits no  thyromegaly Lungs clear with no wheezing and good diaphragmatic motion Heart:  S1/S2 no murmur, no rub, gallop or click PMI normal Abdomen: benighn, BS positve, no tenderness, no AAA no bruit.  No HSM or HJR Distal pulses intact with no bruits Only plus on edema feet LE varicosities Post Left hip replacement  Neuro non-focal Skin warm and dry No muscular weakness   Labs:   Lab Results  Component Value Date   WBC 11.6 (H) 01/05/2016   HGB 10.1 (L) 01/05/2016   HCT 31.5 (L) 01/05/2016   MCV 78.2 01/05/2016   PLT 154 01/05/2016    Recent Labs Lab 01/05/16 0037  NA 126*  K 4.0  CL 91*  CO2 25  BUN 13  CREATININE 0.84  CALCIUM 9.3  GLUCOSE 131*      Radiology: Dg Chest 2 View  Result Date: 01/05/2016 CLINICAL DATA:  Chest pain and lower extremity swelling. Dyspnea for 1 week. EXAM: CHEST  2 VIEW COMPARISON:  None. FINDINGS: The lungs are clear. The pulmonary vasculature is normal. There is no pleural effusion. There is borderline heart size. Hilar and mediastinal contours are unremarkable. There is chronic compression of a lower thoracic vertebral body. IMPRESSION: No acute findings. Electronically Signed   By: Andreas Newport M.D.   On: 01/05/2016 01:34  EKG SR LBBB old was present in 2012    ASSESSMENT AND PLAN:  Dyspnea:  Concern for new onset CHF CXR ok check BNP.  Echo ordered low sodium from self Rx with HCTZ Not clear if systolic or diastolic with LBBB and age may be former  Chest Pain: follow serial troponin ECG chronic LBBB. May need right and left cath if echo shows significant Decrease in LV function  HTN:  Start ARB hold lasix due to low sodium until BNP and echo back     Thyroid: on replacement check TSH   Signed: Jenkins Rouge 01/05/2016, 8:59 AM

## 2016-01-05 NOTE — Progress Notes (Signed)
*  PRELIMINARY RESULTS* Vascular Ultrasound BIlateral lower extremity venous duplex has been completed.  Preliminary findings: No evidence of deep vein thrombosis in the visualized veins of the lower extremities. Negative for baker's cysts  bilaterally.  Kathleen Arias 01/05/2016, 12:17 PM

## 2016-01-05 NOTE — ED Provider Notes (Signed)
[facility] DEPT Provider Note   CSN: [id number] Arrival date & time: 01/05/16  0024   By signing my name below, I, Delton Prairie, attest that this documentation has been prepared under the direction and in the presence of Merryl Hacker, MD  Electronically Signed: Delton Prairie, ED Scribe. 01/05/16. 3:37 AM.   History   Chief Complaint Chief Complaint  Patient presents with  . Chest Pain  . Shortness of Breath   The history is provided by the patient. No language interpreter was used.   HPI Comments:  Kathleen Arias is a 82 y.o. female, with a hx of HTN, sciatica and PSHx of left sided hip surgery, who presents to the Emergency Department complaining of intermittent, worsening, heavy, "5-6/10" chest pain x several weeks. Currently she reports "just a little" pressure. Reports that radiates to her shoulder blades. Did not start after she ate. She states her pain worsened  4 days ago and is sometimes worse when she eats. Pt also reports moderate back pain, SOB x several months, and leg/ankle swelling. Family reports a generalized fatigue. No alleviating factors noted. Pt denies hx of cardiac catheterization, hx of cardiac stents, any other associated symptoms and any other modifying factors at this time. Pt is not followed by a cardiologist. Pt is on hydrochlorothiazide and notes she self increased her dose 3 weeks ago .     Past Medical History:  Diagnosis Date  . Hypertension   . Thyroid disease     There are no active problems to display for this patient.   Past Surgical History:  Procedure Laterality Date  . HIP SURGERY      OB History    No data available       Home Medications    Prior to Admission medications   Not on File    Family History History reviewed. No pertinent family history.  Social History Social History  Substance Use Topics  . Smoking status: Former Research scientist (life sciences)  . Smokeless tobacco: Not on file  . Alcohol use No     Allergies   Patient  has no known allergies.   Review of Systems Review of Systems  Constitutional: Positive for appetite change.  Respiratory: Positive for shortness of breath.   Cardiovascular: Positive for chest pain and leg swelling.  Gastrointestinal: Negative for abdominal pain, nausea and vomiting.  Musculoskeletal: Positive for back pain.  All other systems reviewed and are negative.  Physical Exam Updated Vital Signs BP 145/72   Pulse 75   Temp 98 F (36.7 C) (Oral)   Resp 18   Ht 5\' 6"  (1.676 m)   Wt 140 lb 14.4 oz (63.9 kg)   SpO2 99%   BMI 22.74 kg/m   Physical Exam  Constitutional: She is oriented to person, place, and time. No distress.  Elderly, no acute distress  HENT:  Head: Normocephalic and atraumatic.  Cardiovascular: Normal rate, regular rhythm and normal heart sounds.   No murmur heard. Pulmonary/Chest: Effort normal and breath sounds normal. No respiratory distress. She has no wheezes.  Abdominal: Soft. Bowel sounds are normal. There is no tenderness. There is no guarding.  Musculoskeletal: She exhibits edema.  1+ pitting edema to the ankles  Neurological: She is alert and oriented to person, place, and time.  Skin: Skin is warm and dry.  Psychiatric: She has a normal mood and affect.  Nursing note and vitals reviewed.    ED Treatments / Results  DIAGNOSTIC STUDIES:  Oxygen Saturation is 96% on  RA, normal by my interpretation.    COORDINATION OF CARE:  3:36 AM Discussed treatment plan with pt at bedside and pt agreed to plan.  Labs (all labs ordered are listed, but only abnormal results are displayed) Labs Reviewed  BASIC METABOLIC PANEL - Abnormal; Notable for the following:       Result Value   Sodium 126 (*)    Chloride 91 (*)    Glucose, Bld 131 (*)    All other components within normal limits  CBC - Abnormal; Notable for the following:    WBC 11.6 (*)    Hemoglobin 10.1 (*)    HCT 31.5 (*)    MCH 25.1 (*)    RDW 15.9 (*)    All other  components within normal limits  I-STAT TROPOININ, ED    EKG  EKG Interpretation  Date/Time:  Tuesday January 05 2016 00:31:11 EST Ventricular Rate:  96 PR Interval:  192 QRS Duration: 144 QT Interval:  414 QTC Calculation: 523 R Axis:   -69 Text Interpretation:  Normal sinus rhythm with sinus arrhythmia Left axis deviation Left bundle branch block Abnormal ECG Confirmed by Nyair Depaulo  MD, Ovadia Lopp ([id number]) on 01/05/2016 12:37:05 AM       Radiology Dg Chest 2 View  Result Date: 01/05/2016 CLINICAL DATA:  Chest pain and lower extremity swelling. Dyspnea for 1 week. EXAM: CHEST  2 VIEW COMPARISON:  None. FINDINGS: The lungs are clear. The pulmonary vasculature is normal. There is no pleural effusion. There is borderline heart size. Hilar and mediastinal contours are unremarkable. There is chronic compression of a lower thoracic vertebral body. IMPRESSION: No acute findings. Electronically Signed   By: Andreas Newport M.D.   On: 01/05/2016 01:34    Procedures Procedures (including critical care time)  Medications Ordered in ED Medications  aspirin chewable tablet 324 mg (324 mg Oral Given 01/05/16 0408)  nitroGLYCERIN (NITROGLYN) 2 % ointment 1 inch (1 inch Topical Given 01/05/16 0409)     Initial Impression / Assessment and Plan / ED Course  I have reviewed the triage vital signs and the nursing notes.  Pertinent labs & imaging results that were available during my care of the patient were reviewed by me and considered in my medical decision making (see chart for details).  Clinical Course     Patient presents for chest pain and shortness of breath. Also reports increasing lower extremity swelling. She is nontoxic. Currently on reporting "a little" pressure. EKG does not meet STEMI criteria. She has a left bundle branch block which is similar to prior. Troponin negative. Chest x-ray reassuring. Chest pain is somewhat atypical in that it is associated with food at times but it  also radiates to her back and she has increasing shortness of breath. Leg swelling. She's never had a formal cardiology evaluation. Given her age and comorbidities, will admit for formal chest pain rule out.   She was noted to have a sodium of 126. No hydration given at this time given concerns for peripheral edema.    Final Clinical Impressions(s) / ED Diagnoses   Final diagnoses:  Shortness of breath  Chest pain, unspecified type    New Prescriptions New Prescriptions   No medications on file   I personally performed the services described in this documentation, which was scribed in my presence. The recorded information has been reviewed and is accurate.     Merryl Hacker, MD 01/05/16 0500

## 2016-01-05 NOTE — ED Triage Notes (Signed)
Pt c/o 5/10 cp and SOB, states pain radiates to her shoulder blades, denies fever or chills.

## 2016-01-06 ENCOUNTER — Observation Stay (HOSPITAL_COMMUNITY): Payer: Medicare Other

## 2016-01-06 DIAGNOSIS — R071 Chest pain on breathing: Secondary | ICD-10-CM | POA: Diagnosis not present

## 2016-01-06 DIAGNOSIS — I509 Heart failure, unspecified: Secondary | ICD-10-CM | POA: Diagnosis not present

## 2016-01-06 DIAGNOSIS — E039 Hypothyroidism, unspecified: Secondary | ICD-10-CM | POA: Diagnosis present

## 2016-01-06 DIAGNOSIS — E876 Hypokalemia: Secondary | ICD-10-CM | POA: Diagnosis not present

## 2016-01-06 DIAGNOSIS — D649 Anemia, unspecified: Secondary | ICD-10-CM | POA: Diagnosis not present

## 2016-01-06 DIAGNOSIS — Z23 Encounter for immunization: Secondary | ICD-10-CM | POA: Diagnosis present

## 2016-01-06 DIAGNOSIS — I2510 Atherosclerotic heart disease of native coronary artery without angina pectoris: Secondary | ICD-10-CM | POA: Diagnosis not present

## 2016-01-06 DIAGNOSIS — R9439 Abnormal result of other cardiovascular function study: Secondary | ICD-10-CM | POA: Diagnosis not present

## 2016-01-06 DIAGNOSIS — E871 Hypo-osmolality and hyponatremia: Secondary | ICD-10-CM | POA: Diagnosis present

## 2016-01-06 DIAGNOSIS — I447 Left bundle-branch block, unspecified: Secondary | ICD-10-CM | POA: Diagnosis present

## 2016-01-06 DIAGNOSIS — R0602 Shortness of breath: Secondary | ICD-10-CM | POA: Diagnosis present

## 2016-01-06 DIAGNOSIS — T502X5A Adverse effect of carbonic-anhydrase inhibitors, benzothiadiazides and other diuretics, initial encounter: Secondary | ICD-10-CM | POA: Diagnosis present

## 2016-01-06 DIAGNOSIS — D509 Iron deficiency anemia, unspecified: Secondary | ICD-10-CM | POA: Diagnosis present

## 2016-01-06 DIAGNOSIS — I428 Other cardiomyopathies: Secondary | ICD-10-CM | POA: Diagnosis present

## 2016-01-06 DIAGNOSIS — E162 Hypoglycemia, unspecified: Secondary | ICD-10-CM | POA: Diagnosis present

## 2016-01-06 DIAGNOSIS — I5181 Takotsubo syndrome: Secondary | ICD-10-CM | POA: Diagnosis present

## 2016-01-06 DIAGNOSIS — I110 Hypertensive heart disease with heart failure: Secondary | ICD-10-CM | POA: Diagnosis present

## 2016-01-06 DIAGNOSIS — I5042 Chronic combined systolic (congestive) and diastolic (congestive) heart failure: Secondary | ICD-10-CM | POA: Diagnosis present

## 2016-01-06 DIAGNOSIS — Z79899 Other long term (current) drug therapy: Secondary | ICD-10-CM | POA: Diagnosis not present

## 2016-01-06 DIAGNOSIS — R079 Chest pain, unspecified: Secondary | ICD-10-CM | POA: Diagnosis not present

## 2016-01-06 DIAGNOSIS — Z87891 Personal history of nicotine dependence: Secondary | ICD-10-CM | POA: Diagnosis not present

## 2016-01-06 DIAGNOSIS — R739 Hyperglycemia, unspecified: Secondary | ICD-10-CM | POA: Diagnosis present

## 2016-01-06 DIAGNOSIS — Z7982 Long term (current) use of aspirin: Secondary | ICD-10-CM | POA: Diagnosis not present

## 2016-01-06 DIAGNOSIS — R609 Edema, unspecified: Secondary | ICD-10-CM | POA: Diagnosis not present

## 2016-01-06 LAB — BASIC METABOLIC PANEL
Anion gap: 8 (ref 5–15)
BUN: 8 mg/dL (ref 6–20)
CALCIUM: 8.5 mg/dL — AB (ref 8.9–10.3)
CO2: 28 mmol/L (ref 22–32)
Chloride: 93 mmol/L — ABNORMAL LOW (ref 101–111)
Creatinine, Ser: 0.67 mg/dL (ref 0.44–1.00)
GFR calc Af Amer: >60 mL/min (ref >60)
GLUCOSE: 88 mg/dL (ref 65–99)
POTASSIUM: 3.2 mmol/L — AB (ref 3.5–5.1)
Sodium: 129 mmol/L — ABNORMAL LOW (ref 135–145)

## 2016-01-06 LAB — NM MYOCAR MULTI W/SPECT W/WALL MOTION / EF
CHL RATE OF PERCEIVED EXERTION: 0
CSEPED: 0 min
CSEPEW: 1 METS
CSEPHR: 78 %
Exercise duration (sec): 0 s
MPHR: 132 {beats}/min
Peak HR: 104 {beats}/min
Rest HR: 102 {beats}/min

## 2016-01-06 LAB — CBC
HCT: 26 % — ABNORMAL LOW (ref 36.0–46.0)
Hemoglobin: 8.5 g/dL — ABNORMAL LOW (ref 12.0–15.0)
MCH: 25.9 pg — AB (ref 26.0–34.0)
MCHC: 32.7 g/dL (ref 30.0–36.0)
MCV: 79.3 fL (ref 78.0–100.0)
PLATELETS: 119 10*3/uL — AB (ref 150–400)
RBC: 3.28 MIL/uL — ABNORMAL LOW (ref 3.87–5.11)
RDW: 16.2 % — AB (ref 11.5–15.5)
WBC: 8.4 10*3/uL (ref 4.0–10.5)

## 2016-01-06 LAB — HEMOGLOBIN A1C
Hgb A1c MFr Bld: 4.9 % (ref 4.8–5.6)
Mean Plasma Glucose: 94 mg/dL

## 2016-01-06 MED ORDER — TECHNETIUM TC 99M TETROFOSMIN IV KIT
10.0000 | PACK | Freq: Once | INTRAVENOUS | Status: AC | PRN
  Administered 2016-01-06: 10 via INTRAVENOUS

## 2016-01-06 MED ORDER — SODIUM CHLORIDE 0.9% FLUSH
3.0000 mL | Freq: Two times a day (BID) | INTRAVENOUS | Status: DC
  Administered 2016-01-06 – 2016-01-07 (×2): 3 mL via INTRAVENOUS

## 2016-01-06 MED ORDER — TECHNETIUM TC 99M TETROFOSMIN IV KIT
30.0000 | PACK | Freq: Once | INTRAVENOUS | Status: AC | PRN
  Administered 2016-01-06: 30 via INTRAVENOUS

## 2016-01-06 MED ORDER — ASPIRIN 81 MG PO CHEW
81.0000 mg | CHEWABLE_TABLET | ORAL | Status: AC
  Administered 2016-01-07: 81 mg via ORAL
  Filled 2016-01-06: qty 1

## 2016-01-06 MED ORDER — SODIUM CHLORIDE 0.9 % IV SOLN: 250.0000 mL | INTRAVENOUS | Status: DC | PRN

## 2016-01-06 MED ORDER — SODIUM CHLORIDE 0.9 % IV SOLN: INTRAVENOUS | Status: DC

## 2016-01-06 MED ORDER — REGADENOSON 0.4 MG/5ML IV SOLN
INTRAVENOUS | Status: AC
  Administered 2016-01-06: 0.4 mg via INTRAVENOUS
  Filled 2016-01-06: qty 5

## 2016-01-06 MED ORDER — SODIUM CHLORIDE 0.9% FLUSH: 3.0000 mL | INTRAVENOUS | Status: DC | PRN

## 2016-01-06 MED ORDER — POTASSIUM CHLORIDE CRYS ER 20 MEQ PO TBCR
40.0000 meq | EXTENDED_RELEASE_TABLET | Freq: Once | ORAL | Status: AC
  Administered 2016-01-06: 40 meq via ORAL
  Filled 2016-01-06: qty 2

## 2016-01-06 NOTE — Progress Notes (Signed)
Patient Name: Kathleen Arias Date of Encounter: 01/06/2016  Primary Cardiologist: [facility] Problem List     Active Problems:   Chest pain   Shortness of breath   Localized swelling of lower extremity   Hyponatremia   Hyperglycemia   Musculoskeletal pain     Subjective   No complaints less dyspnea   Inpatient Medications    Scheduled Meds: . aspirin EC  325 mg Oral Daily  . heparin  5,000 Units Subcutaneous Q8H  . Influenza vac split quadrivalent PF  0.5 mL Intramuscular Tomorrow-1000  . [START ON 01/07/2016] levothyroxine  112 mcg Oral Once per day on Sun Tue Thu Sat  . levothyroxine  125 mcg Oral Once per day on Mon Wed Fri  . losartan  25 mg Oral Daily  . regadenoson  0.4 mg Intravenous Once   Continuous Infusions:  PRN Meds: [DISCONTINUED] acetaminophen **OR** acetaminophen, acetaminophen, diclofenac, gi cocktail, nitroGLYCERIN, ondansetron (ZOFRAN) IV, ondansetron **OR** [DISCONTINUED] ondansetron (ZOFRAN) IV, polyvinyl alcohol   Vital Signs    Vitals:   01/05/16 1653 01/05/16 2132 01/06/16 0009 01/06/16 0544  BP: (!) 145/75 (!) 137/55 (!) 139/54 (!) 153/63  Pulse: 77 72 73 78  Resp: 18     Temp: 97.6 F (36.4 C) 97.9 F (36.6 C) 98.1 F (36.7 C) 97.8 F (36.6 C)  TempSrc: Oral Oral Oral Oral  SpO2: 98% 94% 97% 94%  Weight:    138 lb 8 oz (62.8 kg)  Height:        Intake/Output Summary (Last 24 hours) at 01/06/16 0900 Last data filed at 01/05/16 2140  Gross per 24 hour  Intake              780 ml  Output              450 ml  Net              330 ml   Filed Weights   01/05/16 0040 01/05/16 0638 01/06/16 0544  Weight: 140 lb 14.4 oz (63.9 kg) 139 lb 3.2 oz (63.1 kg) 138 lb 8 oz (62.8 kg)    Physical Exam    GEN: Well nourished, well developed, in no acute distress.  HEENT: Grossly normal.  Neck: Supple, no JVD, carotid bruits, or masses. Cardiac: RRR, no murmurs, rubs, or gallops. No clubbing, cyanosis, edema.  Radials/DP/PT 2+ and  equal bilaterally.  Respiratory:  Respirations regular and unlabored, clear to auscultation bilaterally. GI: Soft, nontender, nondistended, BS + x 4. MS: no deformity or atrophy. Skin: warm and dry, no rash. Neuro:  Strength and sensation are intact. Psych: AAOx3.  Normal affect.  Labs    CBC  Recent Labs  01/05/16 0037 01/06/16 0416  WBC 11.6* 8.4  HGB 10.1* 8.5*  HCT 31.5* 26.0*  MCV 78.2 79.3  PLT 154 123456*   Basic Metabolic Panel  Recent Labs  01/05/16 1734 01/06/16 0416  NA 127* 129*  K 3.9 3.2*  CL 91* 93*  CO2 28 28  GLUCOSE 121* 88  BUN 9 8  CREATININE 0.74 0.67  CALCIUM 8.8* 8.5*   Cardiac Enzymes  Recent Labs  01/05/16 1350 01/05/16 1734 01/05/16 2240  TROPONINI <0.03 <0.03 <0.03   Hemoglobin A1C  Recent Labs  01/05/16 1350  HGBA1C 4.9   Thyroid Function Tests  Recent Labs  01/05/16 0953  TSH 3.156    Telemetry    NSR 01/06/2016  - Personally Reviewed  ECG    SR LBBB old  present 2012  - Personally Reviewed  Radiology    Dg Chest 2 View  Result Date: 01/05/2016 CLINICAL DATA:  Chest pain and lower extremity swelling. Dyspnea for 1 week. EXAM: CHEST  2 VIEW COMPARISON:  None. FINDINGS: The lungs are clear. The pulmonary vasculature is normal. There is no pleural effusion. There is borderline heart size. Hilar and mediastinal contours are unremarkable. There is chronic compression of a lower thoracic vertebral body. IMPRESSION: No acute findings. Electronically Signed   By: Andreas Newport M.D.   On: 01/05/2016 01:34    Cardiac Studies   Echo: EF 40-45% inferior hypokinesis ? Ischemic moderate MR grade 2 diastolic dysfunction  Patient Profile     82 y.o. admitted with what appears to be new onset CHF and LE edema  Assessment & Plan    CHF: combination of diastolic dysfunction , ? CAD with RWMA and MR. Given lasix  Last night For myovue today to risk stratify for ischemia Continue ARB. If myovue  Low risk favor medical  Rx and outpatient f/u Has r/o ECG with chronic LBBB And sodium improving likely low from outpatient use of higher dose HCTZ  Signed, Jenkins Rouge, MD  01/06/2016, 9:00 AM

## 2016-01-06 NOTE — Progress Notes (Signed)
Nutrition Brief Note  Patient identified on the Malnutrition Screening Tool (MST) Report  Wt Readings from Last 15 Encounters:  01/06/16 138 lb 8 oz (62.8 kg)   Kathleen Arias is a 82 y.o. female with medical history significant of HTN and hypothyroidism presenting w/ a primary complaint of chest pain. Started several weeks ago. Heavy or pressure sensation. Getting worse. Initially intermittent but now constant. Radiation to shoulder blades. Not associated with meals. Associated with shortness of breath and bilateral lower extremity swelling and fatigue. Patient does become a little more dyspneic with significant exertion. Symptoms remain even with rest. Patient increased her home dose of hydrochlorothiazide 3 weeks ago,. Pain relieved when received nitro in ED.   Reviewed care everywhere records; UBW around 140#.   Pt down for procedure or in with MD at times of visits. Good appetite- consuming 50-100% of meals.  Body mass index is 24.53 kg/m. Patient meets criteria for normal weight range based on current BMI.   Current diet order is Heart Healthy, patient is consuming approximately 50-100% of meals at this time. Labs and medications reviewed.   No nutrition interventions warranted at this time. If nutrition issues arise, please consult RD.   Osker Ayoub A. Jimmye Norman, RD, LDN, CDE Pager: [phone] After hours Pager: [phone]

## 2016-01-06 NOTE — Progress Notes (Signed)
PROGRESS NOTE    Kathleen Arias  [id number] DOB: [birth year] DOA: 01/05/2016 PCP: No primary care provider on file.   Outpatient Specialists:    Brief Narrative:  Kathleen Arias is a 82 y.o. female with medical history significant of HTN and hypothyroidism presenting w/ a primary complaint of chest pain. Started several weeks ago. Heavy or pressure sensation. Getting worse. Initially intermittent but now constant. Radiation to shoulder blades. Not associated with meals. Associated with shortness of breath and bilateral lower extremity swelling and fatigue. Patient does become a little more dyspneic with significant exertion. Symptoms remain even with rest. Patient increased her home dose of hydrochlorothiazide 3 weeks ago,. Pain relieved when received nitro in ED.    Assessment & Plan:   Active Problems:   Chest pain   Shortness of breath   Localized swelling of lower extremity   Hyponatremia   Hyperglycemia   Musculoskeletal pain   CP:  -stress test pending -cards consult appreciated  LE swelling: Duplex negative Echo shows EF of 40-45%  Hyponatremia:  -from HCTZ -improved -will need to follow to completion  Hyperglycemia: 131. No h/o DM - A1c: 4.9  Hypthyroid: - continue synthroid  HTN: - Initiate ARB for low EF  MSK pain:  - continue home voltaren  Anemia -?volume dilution -outpatient follow up  Hypokalemia -replete  DVT prophylaxis:    Code Status: Full Code   Family Communication: Multiple in room  Disposition Plan:     Consultants:   cards     Subjective: Anxious to go home No further chest pain  Objective: Vitals:   01/06/16 1006 01/06/16 1008 01/06/16 1010 01/06/16 1011  BP: (!) 163/94 139/89 (!) 157/87   Pulse: (!) 107 (!) 101 96 96  Resp:      Temp:      TempSrc:      SpO2:      Weight:      Height:        Intake/Output Summary (Last 24 hours) at 01/06/16 1245 Last data filed at 01/05/16 2140  Gross per 24  hour  Intake              480 ml  Output                0 ml  Net              480 ml   Filed Weights   01/05/16 0040 01/05/16 0638 01/06/16 0544  Weight: 63.9 kg (140 lb 14.4 oz) 63.1 kg (139 lb 3.2 oz) 62.8 kg (138 lb 8 oz)    Examination:  General exam: Appears calm and comfortable  Respiratory system: Clear to auscultation. Respiratory effort normal. Cardiovascular system: S1 & S2 heard, RRR. No JVD, murmurs, rubs, gallops or clicks. No pedal edema. Gastrointestinal system: Abdomen is nondistended, soft and nontender. No organomegaly or masses felt. Normal bowel sounds heard.     Data Reviewed: I have personally reviewed following labs and imaging studies  CBC:  Recent Labs Lab 01/05/16 0037 01/06/16 0416  WBC 11.6* 8.4  HGB 10.1* 8.5*  HCT 31.5* 26.0*  MCV 78.2 79.3  PLT 154 123456*   Basic Metabolic Panel:  Recent Labs Lab 01/05/16 0037 01/05/16 1734 01/06/16 0416  NA 126* 127* 129*  K 4.0 3.9 3.2*  CL 91* 91* 93*  CO2 25 28 28   GLUCOSE 131* 121* 88  BUN 13 9 8   CREATININE 0.84 0.74 0.67  CALCIUM 9.3 8.8* 8.5*   GFR: Estimated  Creatinine Clearance: 40.2 mL/min (by C-G formula based on SCr of 0.67 mg/dL). Liver Function Tests: No results for input(s): AST, ALT, ALKPHOS, BILITOT, PROT, ALBUMIN in the last 168 hours. No results for input(s): LIPASE, AMYLASE in the last 168 hours. No results for input(s): AMMONIA in the last 168 hours. Coagulation Profile: No results for input(s): INR, PROTIME in the last 168 hours. Cardiac Enzymes:  Recent Labs Lab 01/05/16 1350 01/05/16 1734 01/05/16 2240  TROPONINI <0.03 <0.03 <0.03   BNP (last 3 results) No results for input(s): PROBNP in the last 8760 hours. HbA1C:  Recent Labs  01/05/16 1350  HGBA1C 4.9   CBG:  Recent Labs Lab 01/05/16 1709  GLUCAP 87   Lipid Profile: No results for input(s): CHOL, HDL, LDLCALC, TRIG, CHOLHDL, LDLDIRECT in the last 72 hours. Thyroid Function Tests:  Recent  Labs  01/05/16 0953  TSH 3.156   Anemia Panel: No results for input(s): VITAMINB12, FOLATE, FERRITIN, TIBC, IRON, RETICCTPCT in the last 72 hours. Urine analysis:    Component Value Date/Time   COLORURINE YELLOW 08/21/2010 1229   APPEARANCEUR CLOUDY (A) 08/21/2010 1229   LABSPEC 1.025 08/21/2010 1229   PHURINE 6.0 08/21/2010 1229   GLUCOSEU NEGATIVE 08/21/2010 1229   HGBUR NEGATIVE 08/21/2010 1229   BILIRUBINUR NEGATIVE 08/21/2010 1229   KETONESUR NEGATIVE 08/21/2010 1229   PROTEINUR NEGATIVE 08/21/2010 1229   UROBILINOGEN 0.2 08/21/2010 1229   NITRITE NEGATIVE 08/21/2010 1229   LEUKOCYTESUR LARGE (A) 08/21/2010 1229     )No results found for this or any previous visit (from the past 240 hour(s)).    Anti-infectives    None       Radiology Studies: Dg Chest 2 View  Result Date: 01/05/2016 CLINICAL DATA:  Chest pain and lower extremity swelling. Dyspnea for 1 week. EXAM: CHEST  2 VIEW COMPARISON:  None. FINDINGS: The lungs are clear. The pulmonary vasculature is normal. There is no pleural effusion. There is borderline heart size. Hilar and mediastinal contours are unremarkable. There is chronic compression of a lower thoracic vertebral body. IMPRESSION: No acute findings. Electronically Signed   By: Andreas Newport M.D.   On: 01/05/2016 01:34        Scheduled Meds: . aspirin EC  325 mg Oral Daily  . heparin  5,000 Units Subcutaneous Q8H  . [START ON 01/07/2016] levothyroxine  112 mcg Oral Once per day on Sun Tue Thu Sat  . levothyroxine  125 mcg Oral Once per day on Mon Wed Fri  . losartan  25 mg Oral Daily  . potassium chloride  40 mEq Oral Once   Continuous Infusions:   LOS: 0 days    Time spent: 25 min    Moran, DO [facility] Pager [phone]  If 7PM-7AM, please contact night-coverage [email or web address] Password [email or web address] 01/06/2016, 12:45 PM

## 2016-01-06 NOTE — Progress Notes (Signed)
Stress portion of lexiscan completed without complications results to follow later today.

## 2016-01-06 NOTE — Progress Notes (Signed)
Discussed nuc with Dr. Johnsie Cancel and he would like to do at least a diagnostic cath.  Will discuss with pt.

## 2016-01-06 NOTE — Progress Notes (Signed)
Notifies by CCMD that patient went into none sustained SVT. On my patient assessment, patient said she was scared by the bed alarm when she tried to stand up from her bed. She is not in any distress, RR is even and regular.

## 2016-01-06 NOTE — Progress Notes (Signed)
Discussed cardiac cath with pt, grandson, and son.  She was hesitant but has agreed to proceed with cardiac cath.  The patient understands that risks included but are not limited to stroke (1 in 1000), death (1 in 15), kidney failure [usually temporary] (1 in 500), bleeding (1 in 200), allergic reaction [possibly serious] (1 in 200).   She would like to talk with Dr. Johnsie Cancel in AM.

## 2016-01-07 ENCOUNTER — Encounter (HOSPITAL_COMMUNITY)
Admission: EM | Disposition: A | Discharge status: Home / Self Care | Payer: Self-pay | Source: Home / Self Care | Attending: Internal Medicine

## 2016-01-07 ENCOUNTER — Encounter (HOSPITAL_COMMUNITY): Payer: Self-pay | Admitting: Interventional Cardiology

## 2016-01-07 DIAGNOSIS — R609 Edema, unspecified: Secondary | ICD-10-CM

## 2016-01-07 DIAGNOSIS — I2510 Atherosclerotic heart disease of native coronary artery without angina pectoris: Secondary | ICD-10-CM

## 2016-01-07 DIAGNOSIS — R9439 Abnormal result of other cardiovascular function study: Secondary | ICD-10-CM

## 2016-01-07 DIAGNOSIS — D649 Anemia, unspecified: Secondary | ICD-10-CM

## 2016-01-07 DIAGNOSIS — D509 Iron deficiency anemia, unspecified: Secondary | ICD-10-CM

## 2016-01-07 HISTORY — PX: CARDIAC CATHETERIZATION: SHX172

## 2016-01-07 LAB — CBC
HEMATOCRIT: 28.4 % — AB (ref 36.0–46.0)
HEMOGLOBIN: 9.3 g/dL — AB (ref 12.0–15.0)
MCH: 25.6 pg — ABNORMAL LOW (ref 26.0–34.0)
MCHC: 32.7 g/dL (ref 30.0–36.0)
MCV: 78.2 fL (ref 78.0–100.0)
Platelets: 137 10*3/uL — ABNORMAL LOW (ref 150–400)
RBC: 3.63 MIL/uL — AB (ref 3.87–5.11)
RDW: 15.9 % — ABNORMAL HIGH (ref 11.5–15.5)
WBC: 8.4 10*3/uL (ref 4.0–10.5)

## 2016-01-07 LAB — BASIC METABOLIC PANEL WITH GFR
Anion gap: 7 (ref 5–15)
BUN: 7 mg/dL (ref 6–20)
CO2: 28 mmol/L (ref 22–32)
Calcium: 8.7 mg/dL — ABNORMAL LOW (ref 8.9–10.3)
Chloride: 94 mmol/L — ABNORMAL LOW (ref 101–111)
Creatinine, Ser: 0.64 mg/dL (ref 0.44–1.00)
GFR calc Af Amer: >60 mL/min
GFR calc non Af Amer: >60 mL/min
Glucose, Bld: 100 mg/dL — ABNORMAL HIGH (ref 65–99)
Potassium: 3.9 mmol/L (ref 3.5–5.1)
Sodium: 129 mmol/L — ABNORMAL LOW (ref 135–145)

## 2016-01-07 LAB — FERRITIN: FERRITIN: 57 ng/mL (ref 11–307)

## 2016-01-07 LAB — RETICULOCYTES
RBC.: 3.64 MIL/uL — ABNORMAL LOW (ref 3.87–5.11)
RETIC COUNT ABSOLUTE: 131 10*3/uL (ref 19.0–186.0)
Retic Ct Pct: 3.6 % — ABNORMAL HIGH (ref 0.4–3.1)

## 2016-01-07 LAB — PROTIME-INR
INR: 1.01
Prothrombin Time: 13.3 seconds (ref 11.4–15.2)

## 2016-01-07 LAB — IRON AND TIBC
IRON: 34 ug/dL (ref 28–170)
Saturation Ratios: 8 % — ABNORMAL LOW (ref 10.4–31.8)
TIBC: 451 ug/dL — AB (ref 250–450)
UIBC: 417 ug/dL

## 2016-01-07 LAB — FOLATE: Folate: 26.1 ng/mL (ref >5.9)

## 2016-01-07 LAB — VITAMIN B12: Vitamin B-12: 509 pg/mL (ref 180–914)

## 2016-01-07 LAB — OCCULT BLOOD X 1 CARD TO LAB, STOOL: Fecal Occult Bld: NEGATIVE

## 2016-01-07 SURGERY — LEFT HEART CATH AND CORONARY ANGIOGRAPHY
Anesthesia: LOCAL

## 2016-01-07 MED ORDER — LIDOCAINE HCL (PF) 1 % IJ SOLN
INTRAMUSCULAR | Status: AC
  Filled 2016-01-07: qty 30

## 2016-01-07 MED ORDER — SODIUM CHLORIDE 0.9% FLUSH
3.0000 mL | Freq: Two times a day (BID) | INTRAVENOUS | Status: DC
  Administered 2016-01-07 – 2016-01-08 (×3): 3 mL via INTRAVENOUS

## 2016-01-07 MED ORDER — HEPARIN SODIUM (PORCINE) 5000 UNIT/ML IJ SOLN
5000.0000 [IU] | Freq: Three times a day (TID) | INTRAMUSCULAR | Status: DC
  Administered 2016-01-07 – 2016-01-08 (×2): 5000 [IU] via SUBCUTANEOUS
  Filled 2016-01-07 (×2): qty 1

## 2016-01-07 MED ORDER — CARVEDILOL 3.125 MG PO TABS: 3.1250 mg | ORAL_TABLET | Freq: Two times a day (BID) | ORAL | Status: DC

## 2016-01-07 MED ORDER — SODIUM CHLORIDE 0.9 % IV SOLN
INTRAVENOUS | Status: DC | PRN
  Administered 2016-01-07: 10 mL/h via INTRAVENOUS

## 2016-01-07 MED ORDER — HEPARIN SODIUM (PORCINE) 1000 UNIT/ML IJ SOLN
INTRAMUSCULAR | Status: AC
  Filled 2016-01-07: qty 1

## 2016-01-07 MED ORDER — ATORVASTATIN CALCIUM 80 MG PO TABS: 80.0000 mg | ORAL_TABLET | Freq: Every day | ORAL | Status: DC

## 2016-01-07 MED ORDER — MIDAZOLAM HCL 2 MG/2ML IJ SOLN
INTRAMUSCULAR | Status: DC | PRN
  Administered 2016-01-07: 1 mg via INTRAVENOUS

## 2016-01-07 MED ORDER — ACETAMINOPHEN 325 MG PO TABS: 650.0000 mg | ORAL_TABLET | ORAL | Status: DC | PRN

## 2016-01-07 MED ORDER — IOPAMIDOL (ISOVUE-370) INJECTION 76%
INTRAVENOUS | Status: AC
  Filled 2016-01-07: qty 100

## 2016-01-07 MED ORDER — VERAPAMIL HCL 2.5 MG/ML IV SOLN
INTRAVENOUS | Status: AC
  Filled 2016-01-07: qty 2

## 2016-01-07 MED ORDER — FENTANYL CITRATE (PF) 100 MCG/2ML IJ SOLN
INTRAMUSCULAR | Status: DC | PRN
  Administered 2016-01-07: 25 ug via INTRAVENOUS

## 2016-01-07 MED ORDER — HEPARIN (PORCINE) IN NACL 2-0.9 UNIT/ML-% IJ SOLN
INTRAMUSCULAR | Status: AC
Start: 2016-01-07 — End: 2016-01-07
  Filled 2016-01-07: qty 1000

## 2016-01-07 MED ORDER — MIDAZOLAM HCL 2 MG/2ML IJ SOLN
INTRAMUSCULAR | Status: AC
  Filled 2016-01-07: qty 2

## 2016-01-07 MED ORDER — SODIUM CHLORIDE 0.9 % IV SOLN: 250.0000 mL | INTRAVENOUS | Status: DC | PRN

## 2016-01-07 MED ORDER — FENTANYL CITRATE (PF) 100 MCG/2ML IJ SOLN
INTRAMUSCULAR | Status: AC
  Filled 2016-01-07: qty 2

## 2016-01-07 MED ORDER — LIDOCAINE HCL (PF) 1 % IJ SOLN
INTRAMUSCULAR | Status: DC | PRN
  Administered 2016-01-07: 3 mL

## 2016-01-07 MED ORDER — IOPAMIDOL (ISOVUE-370) INJECTION 76%
INTRAVENOUS | Status: DC | PRN
  Administered 2016-01-07: 60 mL via INTRA_ARTERIAL

## 2016-01-07 MED ORDER — POLYETHYLENE GLYCOL 3350 17 G PO PACK
17.0000 g | PACK | Freq: Two times a day (BID) | ORAL | Status: DC
  Filled 2016-01-07: qty 1

## 2016-01-07 MED ORDER — VERAPAMIL HCL 2.5 MG/ML IV SOLN
INTRAVENOUS | Status: DC | PRN
  Administered 2016-01-07: 10 mL via INTRA_ARTERIAL

## 2016-01-07 MED ORDER — SODIUM CHLORIDE 0.9 % IV SOLN
250.0000 mL | INTRAVENOUS | Status: DC | PRN
Start: 2016-01-07 — End: 2016-01-08

## 2016-01-07 MED ORDER — SODIUM CHLORIDE 0.9% FLUSH: 3.0000 mL | INTRAVENOUS | Status: DC | PRN

## 2016-01-07 MED ORDER — ONDANSETRON HCL 4 MG/2ML IJ SOLN: 4.0000 mg | Freq: Four times a day (QID) | INTRAMUSCULAR | Status: DC | PRN

## 2016-01-07 MED ORDER — HEPARIN (PORCINE) IN NACL 2-0.9 UNIT/ML-% IJ SOLN
INTRAMUSCULAR | Status: DC | PRN
  Administered 2016-01-07: 1000 mL

## 2016-01-07 MED ORDER — SODIUM CHLORIDE 0.9% FLUSH
3.0000 mL | Freq: Two times a day (BID) | INTRAVENOUS | Status: DC
  Administered 2016-01-07 – 2016-01-08 (×2): 3 mL via INTRAVENOUS

## 2016-01-07 MED ORDER — HEPARIN SODIUM (PORCINE) 1000 UNIT/ML IJ SOLN
INTRAMUSCULAR | Status: DC | PRN
  Administered 2016-01-07: 3500 [IU] via INTRAVENOUS

## 2016-01-07 MED ORDER — SENNOSIDES-DOCUSATE SODIUM 8.6-50 MG PO TABS: 1.0000 | ORAL_TABLET | Freq: Every evening | ORAL | Status: DC | PRN

## 2016-01-07 MED ORDER — HEPARIN (PORCINE) IN NACL 2-0.9 UNIT/ML-% IJ SOLN
INTRAMUSCULAR | Status: AC
  Filled 2016-01-07: qty 1000

## 2016-01-07 SURGICAL SUPPLY — 9 items
CATH IMPULSE 5F ANG/FL3.5 (CATHETERS) ×2 IMPLANT
DEVICE RAD COMP TR BAND LRG (VASCULAR PRODUCTS) ×2 IMPLANT
GLIDESHEATH SLEND SS 6F .021 (SHEATH) ×2 IMPLANT
GUIDEWIRE INQWIRE 1.5J.035X260 (WIRE) ×1 IMPLANT
INQWIRE 1.5J .035X260CM (WIRE) ×2
KIT HEART LEFT (KITS) ×2 IMPLANT
PACK CARDIAC CATHETERIZATION (CUSTOM PROCEDURE TRAY) ×2 IMPLANT
TRANSDUCER W/STOPCOCK (MISCELLANEOUS) ×2 IMPLANT
TUBING CIL FLEX 10 FLL-RA (TUBING) ×2 IMPLANT

## 2016-01-07 NOTE — Interval H&P Note (Signed)
Cath Lab Visit (complete for each Cath Lab visit)  Clinical Evaluation Leading to the Procedure:   ACS: Yes.    Non-ACS:    Anginal Classification: CCS IV  Anti-ischemic medical therapy: Minimal Therapy (1 class of medications)  Non-Invasive Test Results: High-risk stress test findings: cardiac mortality >3%/year  Prior CABG: No previous CABG      History and Physical Interval Note:  01/07/2016 11:09 AM  Kathleen Arias  has presented today for surgery, with the diagnosis of sob, chf, positive nuclear stress test  The various methods of treatment have been discussed with the patient and family. After consideration of risks, benefits and other options for treatment, the patient has consented to  Procedure(s): Left Heart Cath and Coronary Angiography (N/A) as a surgical intervention .  The patient's history has been reviewed, patient examined, no change in status, stable for surgery.  I have reviewed the patient's chart and labs.  Questions were answered to the patient's satisfaction.     Larae Grooms

## 2016-01-07 NOTE — Progress Notes (Signed)
[facility] PROGRESS NOTE    Progress Note  Kathleen Arias  [id number] DOB: [birth year] DOA: 01/05/2016 PCP: No primary care provider on file.     Brief Narrative:   Kathleen Arias is an 82 y.o. female past medical history of essential hypertension hypothyroidism comes in with primary complaint of chest pain.  Assessment/Plan:   Active Problems:   Chest pain   Shortness of breath   Localized swelling of lower extremity   Hyponatremia   Hyperglycemia   Musculoskeletal pain  Chest pain: Cardiology was consulted recommended a stress test that showed reversible signs of ischemia, and cardiology will like to proceed with cardiac cath. Continue aspirin at statins.  Chronic combined systolic and diastolic heart failure: Duplex was negative, echo showed a EF of 40-45%. Cardiology coreg low dose. Creatinine less than 1, continue ARB.  Hyponatremia: Likely due to hydrochlorothiazide improving, will continue check basic metabolic panel daily.  Hypoglycemia: A1c of 4.9 by distress emargination.  Hypothyroidism: Continue Synthroid.  Essential hypertension: Blood pressure elevated jugular  Normocytic anemia: With a borderline MCV and elevated RDW concern about iron deficiency anemia check an anemia panel. Tobacco stools. Started on Senokot- s and  MiraLAX.   DVT prophylaxis: lovenox Family Communication:none Disposition Plan/Barrier to D/C: hopefully home in am Code Status:     Code Status Orders        Start     Ordered   01/05/16 1117  Full code  Continuous     01/05/16 1120    Code Status History    Date Active Date Inactive Code Status Order ID Comments User Context   01/05/2016 11:20 AM 01/05/2016 11:20 AM Full Code [id number]  Waldemar Dickens, MD Inpatient        IV Access:    Peripheral IV   Procedures and diagnostic studies:   Nm Myocar Multi W/spect W/wall Motion / Ef  Result Date: 01/06/2016 CLINICAL DATA:  82 year old female with a  history dyspnea on exertion. Cardiovascular risk factors include hypertension. EXAM: MYOCARDIAL IMAGING WITH SPECT (REST AND PHARMACOLOGIC-STRESS) GATED LEFT VENTRICULAR WALL MOTION STUDY LEFT VENTRICULAR EJECTION FRACTION TECHNIQUE: Standard myocardial SPECT imaging was performed after resting intravenous injection of 10 mCi Tc-3m tetrofosmin. Subsequently, intravenous infusion of Lexiscan was performed under the supervision of the Cardiology staff. At peak effect of the drug, 30 mCi Tc-27m tetrofosmin was injected intravenously and standard myocardial SPECT imaging was performed. Quantitative gated imaging was also performed to evaluate left ventricular wall motion, and estimate left ventricular ejection fraction. COMPARISON:  None. FINDINGS: Perfusion: Large reversible perfusion defect involving the inferior septal wall extending from mid ventricle to apex of moderate severity. There is associated transient ischemic dilation. Wall Motion: Hypokinetic apical motion. Left Ventricular Ejection Fraction: 99991111 % End diastolic volume 73 ml End systolic volume 36 ml IMPRESSION: 1. Reversible defect involving the inferior septal wall extending from mid ventricle to apex of moderate severity large size. There is associated transient ischemic dilation. 2. Hypokinetic apical wall. 3. Left ventricular ejection fraction 36% 4. Non invasive risk stratification*: High *2012 Appropriate Use Criteria for Coronary Revascularization Focused Update: J Am Coll Cardiol. [id number]. http://content.[email or web address]?articleid=[email or web address] Electronically Signed   By: Corrie Mckusick D.O.   On: 01/06/2016 16:38     Medical Consultants:    None.  Anti-Infectives:   None  Subjective:    Kathleen Arias no chest pain or shortness of breath. She hasn't had a bowel movement.   Objective:    Vitals:   01/06/16 1011 01/06/16 2119  01/07/16 0350 01/07/16 0444  BP:  (!) 139/49  (!) 156/58  Pulse: 96 94  79    Resp:      Temp:  98.2 F (36.8 C)  98 F (36.7 C)  TempSrc:  Oral  Oral  SpO2:  96%  95%  Weight:   63.4 kg (139 lb 12.8 oz)   Height:        Intake/Output Summary (Last 24 hours) at 01/07/16 0919 Last data filed at 01/07/16 0900  Gross per 24 hour  Intake              483 ml  Output                0 ml  Net              483 ml   Filed Weights   01/05/16 0638 01/06/16 0544 01/07/16 0350  Weight: 63.1 kg (139 lb 3.2 oz) 62.8 kg (138 lb 8 oz) 63.4 kg (139 lb 12.8 oz)    Exam: General exam: In no acute distress. Respiratory system: Good air movement and clear to auscultation. Cardiovascular system: S1 & S2 heard, RRR. No JVD. Gastrointestinal system: Abdomen is nondistended, soft and nontender.  Extremities: No pedal edema. Skin: No rashes, lesions or ulcers Psychiatry: Judgement and insight appear normal. Mood & affect appropriate.    Data Reviewed:    Labs: Basic Metabolic Panel:  Recent Labs Lab 01/05/16 0037 01/05/16 1734 01/06/16 0416 01/07/16 0456  NA 126* 127* 129* 129*  K 4.0 3.9 3.2* 3.9  CL 91* 91* 93* 94*  CO2 25 28 28 28   GLUCOSE 131* 121* 88 100*  BUN 13 9 8 7   CREATININE 0.84 0.74 0.67 0.64  CALCIUM 9.3 8.8* 8.5* 8.7*   GFR Estimated Creatinine Clearance: 43.6 mL/min (by C-G formula based on SCr of 0.64 mg/dL). Liver Function Tests: No results for input(s): AST, ALT, ALKPHOS, BILITOT, PROT, ALBUMIN in the last 168 hours. No results for input(s): LIPASE, AMYLASE in the last 168 hours. No results for input(s): AMMONIA in the last 168 hours. Coagulation profile  Recent Labs Lab 01/07/16 0456  INR 1.01    CBC:  Recent Labs Lab 01/05/16 0037 01/06/16 0416 01/07/16 0456  WBC 11.6* 8.4 8.4  HGB 10.1* 8.5* 9.3*  HCT 31.5* 26.0* 28.4*  MCV 78.2 79.3 78.2  PLT 154 119* 137*   Cardiac Enzymes:  Recent Labs Lab 01/05/16 1350 01/05/16 1734 01/05/16 2240  TROPONINI <0.03 <0.03 <0.03   BNP (last 3 results) No results for  input(s): PROBNP in the last 8760 hours. CBG:  Recent Labs Lab 01/05/16 1709  GLUCAP 87   D-Dimer: No results for input(s): DDIMER in the last 72 hours. Hgb A1c:  Recent Labs  01/05/16 1350  HGBA1C 4.9   Lipid Profile: No results for input(s): CHOL, HDL, LDLCALC, TRIG, CHOLHDL, LDLDIRECT in the last 72 hours. Thyroid function studies:  Recent Labs  01/05/16 0953  TSH 3.156   Anemia work up: No results for input(s): VITAMINB12, FOLATE, FERRITIN, TIBC, IRON, RETICCTPCT in the last 72 hours. Sepsis Labs:  Recent Labs Lab 01/05/16 0037 01/06/16 0416 01/07/16 0456  WBC 11.6* 8.4 8.4   Microbiology No results found for this or any previous visit (from the past 240 hour(s)).   Medications:   . aspirin  81 mg Oral Pre-Cath  . aspirin EC  325 mg Oral Daily  . heparin  5,000 Units Subcutaneous Q8H  . levothyroxine  112 mcg Oral  Once per day on Sun Tue Thu Sat  . levothyroxine  125 mcg Oral Once per day on Mon Wed Fri  . losartan  25 mg Oral Daily  . sodium chloride flush  3 mL Intravenous Q12H   Continuous Infusions: . sodium chloride      Time spent: 25 min   LOS: 1 day   Charlynne Cousins  [facility] Pager [phone]  *Please refer to [email or web address], password [email or web address] to get updated schedule on who will round on this patient, as hospitalists switch teams weekly. If 7PM-7AM, please contact night-coverage at [email or web address], password [email or web address] for any overnight needs.  01/07/2016, 9:19 AM

## 2016-01-07 NOTE — H&P (View-Only) (Signed)
Patient Name: Kathleen Arias Date of Encounter: 01/07/2016  Primary Cardiologist: [facility] Problem List     Active Problems:   Chest pain   Shortness of breath   Localized swelling of lower extremity   Hyponatremia   Hyperglycemia   Musculoskeletal pain     Subjective   Wants suppository less dyspnea no chest pain   Inpatient Medications    Scheduled Meds: . aspirin  81 mg Oral Pre-Cath  . aspirin EC  325 mg Oral Daily  . atorvastatin  80 mg Oral q1800  . carvedilol  3.125 mg Oral BID WC  . heparin  5,000 Units Subcutaneous Q8H  . levothyroxine  112 mcg Oral Once per day on Sun Tue Thu Sat  . levothyroxine  125 mcg Oral Once per day on Mon Wed Fri  . losartan  25 mg Oral Daily  . polyethylene glycol  17 g Oral BID  . sodium chloride flush  3 mL Intravenous Q12H   Continuous Infusions: . sodium chloride     PRN Meds: sodium chloride, [DISCONTINUED] acetaminophen **OR** acetaminophen, acetaminophen, diclofenac, gi cocktail, nitroGLYCERIN, ondansetron (ZOFRAN) IV, ondansetron **OR** [DISCONTINUED] ondansetron (ZOFRAN) IV, polyvinyl alcohol, senna-docusate, sodium chloride flush   Vital Signs    Vitals:   01/06/16 1011 01/06/16 2119 01/07/16 0350 01/07/16 0444  BP:  (!) 139/49  (!) 156/58  Pulse: 96 94  79  Resp:      Temp:  98.2 F (36.8 C)  98 F (36.7 C)  TempSrc:  Oral  Oral  SpO2:  96%  95%  Weight:   139 lb 12.8 oz (63.4 kg)   Height:        Intake/Output Summary (Last 24 hours) at 01/07/16 0927 Last data filed at 01/07/16 0900  Gross per 24 hour  Intake              483 ml  Output                0 ml  Net              483 ml   Filed Weights   01/05/16 0638 01/06/16 0544 01/07/16 0350  Weight: 139 lb 3.2 oz (63.1 kg) 138 lb 8 oz (62.8 kg) 139 lb 12.8 oz (63.4 kg)    Physical Exam    GEN: Well nourished, well developed, in no acute distress.  HEENT: Grossly normal.  Neck: Supple, no JVD, carotid bruits, or masses. Cardiac: RRR, no  murmurs, rubs, or gallops. No clubbing, cyanosis, edema.  Radials/DP/PT 2+ and equal bilaterally.  Respiratory:  Respirations regular and unlabored, clear to auscultation bilaterally. GI: Soft, nontender, nondistended, BS + x 4. MS: no deformity or atrophy. Skin: warm and dry, no rash. Neuro:  Strength and sensation are intact. Psych: AAOx3.  Normal affect.  Labs    CBC  Recent Labs  01/06/16 0416 01/07/16 0456  WBC 8.4 8.4  HGB 8.5* 9.3*  HCT 26.0* 28.4*  MCV 79.3 78.2  PLT 119* 0000000*   Basic Metabolic Panel  Recent Labs  01/06/16 0416 01/07/16 0456  NA 129* 129*  K 3.2* 3.9  CL 93* 94*  CO2 28 28  GLUCOSE 88 100*  BUN 8 7  CREATININE 0.67 0.64  CALCIUM 8.5* 8.7*   Cardiac Enzymes  Recent Labs  01/05/16 1350 01/05/16 1734 01/05/16 2240  TROPONINI <0.03 <0.03 <0.03   Hemoglobin A1C  Recent Labs  01/05/16 1350  HGBA1C 4.9   Thyroid Function Tests  Recent Labs  01/05/16 0953  TSH 3.156    Telemetry    NSR 01/07/2016  - Personally Reviewed  ECG    SR LBBB old present 2012  - Personally Reviewed  Radiology    Nm Myocar Multi W/spect W/wall Motion / Ef  Result Date: 01/06/2016 CLINICAL DATA:  82 year old female with a history dyspnea on exertion. Cardiovascular risk factors include hypertension. EXAM: MYOCARDIAL IMAGING WITH SPECT (REST AND PHARMACOLOGIC-STRESS) GATED LEFT VENTRICULAR WALL MOTION STUDY LEFT VENTRICULAR EJECTION FRACTION TECHNIQUE: Standard myocardial SPECT imaging was performed after resting intravenous injection of 10 mCi Tc-27m tetrofosmin. Subsequently, intravenous infusion of Lexiscan was performed under the supervision of the Cardiology staff. At peak effect of the drug, 30 mCi Tc-23m tetrofosmin was injected intravenously and standard myocardial SPECT imaging was performed. Quantitative gated imaging was also performed to evaluate left ventricular wall motion, and estimate left ventricular ejection fraction. COMPARISON:   None. FINDINGS: Perfusion: Large reversible perfusion defect involving the inferior septal wall extending from mid ventricle to apex of moderate severity. There is associated transient ischemic dilation. Wall Motion: Hypokinetic apical motion. Left Ventricular Ejection Fraction: 99991111 % End diastolic volume 73 ml End systolic volume 36 ml IMPRESSION: 1. Reversible defect involving the inferior septal wall extending from mid ventricle to apex of moderate severity large size. There is associated transient ischemic dilation. 2. Hypokinetic apical wall. 3. Left ventricular ejection fraction 36% 4. Non invasive risk stratification*: High *2012 Appropriate Use Criteria for Coronary Revascularization Focused Update: J Am Coll Cardiol. B5713794. http://content.[email or web address].aspx?articleid=[email or web address] Electronically Signed   By: Corrie Mckusick D.O.   On: 01/06/2016 16:38    Cardiac Studies   Echo: EF 40-45% inferior hypokinesis ? Ischemic moderate MR grade 2 diastolic dysfunction  Patient Profile     82 y.o. admitted with what appears to be new onset CHF and LE edema  Assessment & Plan    CHF: combination of diastolic dysfunction , ? CAD with RWMA and MR. Myovue High risk with TID and infarct/ischemia in multiple regions. For cath today  Needs further w/u of anemia. Suspect CHF is a combination of ischemic MR Decreased EF and LBBB.  Would write for daily lasix 20 mg after cath.  Discussed With primary will check FOBT after suppository today   Signed, Jenkins Rouge, MD  01/07/2016, 9:27 AM  Patient ID: Kathleen Arias, female   DOB: 1925-02-19, 82 y.o.   MRN: [id number]

## 2016-01-07 NOTE — Progress Notes (Signed)
Patient Name: Kathleen Arias Date of Encounter: 01/07/2016  Primary Cardiologist: [facility] Problem List     Active Problems:   Chest pain   Shortness of breath   Localized swelling of lower extremity   Hyponatremia   Hyperglycemia   Musculoskeletal pain     Subjective   Wants suppository less dyspnea no chest pain   Inpatient Medications    Scheduled Meds: . aspirin  81 mg Oral Pre-Cath  . aspirin EC  325 mg Oral Daily  . atorvastatin  80 mg Oral q1800  . carvedilol  3.125 mg Oral BID WC  . heparin  5,000 Units Subcutaneous Q8H  . levothyroxine  112 mcg Oral Once per day on Sun Tue Thu Sat  . levothyroxine  125 mcg Oral Once per day on Mon Wed Fri  . losartan  25 mg Oral Daily  . polyethylene glycol  17 g Oral BID  . sodium chloride flush  3 mL Intravenous Q12H   Continuous Infusions: . sodium chloride     PRN Meds: sodium chloride, [DISCONTINUED] acetaminophen **OR** acetaminophen, acetaminophen, diclofenac, gi cocktail, nitroGLYCERIN, ondansetron (ZOFRAN) IV, ondansetron **OR** [DISCONTINUED] ondansetron (ZOFRAN) IV, polyvinyl alcohol, senna-docusate, sodium chloride flush   Vital Signs    Vitals:   01/06/16 1011 01/06/16 2119 01/07/16 0350 01/07/16 0444  BP:  (!) 139/49  (!) 156/58  Pulse: 96 94  79  Resp:      Temp:  98.2 F (36.8 C)  98 F (36.7 C)  TempSrc:  Oral  Oral  SpO2:  96%  95%  Weight:   139 lb 12.8 oz (63.4 kg)   Height:        Intake/Output Summary (Last 24 hours) at 01/07/16 0927 Last data filed at 01/07/16 0900  Gross per 24 hour  Intake              483 ml  Output                0 ml  Net              483 ml   Filed Weights   01/05/16 0638 01/06/16 0544 01/07/16 0350  Weight: 139 lb 3.2 oz (63.1 kg) 138 lb 8 oz (62.8 kg) 139 lb 12.8 oz (63.4 kg)    Physical Exam    GEN: Well nourished, well developed, in no acute distress.  HEENT: Grossly normal.  Neck: Supple, no JVD, carotid bruits, or masses. Cardiac: RRR, no  murmurs, rubs, or gallops. No clubbing, cyanosis, edema.  Radials/DP/PT 2+ and equal bilaterally.  Respiratory:  Respirations regular and unlabored, clear to auscultation bilaterally. GI: Soft, nontender, nondistended, BS + x 4. MS: no deformity or atrophy. Skin: warm and dry, no rash. Neuro:  Strength and sensation are intact. Psych: AAOx3.  Normal affect.  Labs    CBC  Recent Labs  01/06/16 0416 01/07/16 0456  WBC 8.4 8.4  HGB 8.5* 9.3*  HCT 26.0* 28.4*  MCV 79.3 78.2  PLT 119* 0000000*   Basic Metabolic Panel  Recent Labs  01/06/16 0416 01/07/16 0456  NA 129* 129*  K 3.2* 3.9  CL 93* 94*  CO2 28 28  GLUCOSE 88 100*  BUN 8 7  CREATININE 0.67 0.64  CALCIUM 8.5* 8.7*   Cardiac Enzymes  Recent Labs  01/05/16 1350 01/05/16 1734 01/05/16 2240  TROPONINI <0.03 <0.03 <0.03   Hemoglobin A1C  Recent Labs  01/05/16 1350  HGBA1C 4.9   Thyroid Function Tests  Recent Labs  01/05/16 0953  TSH 3.156    Telemetry    NSR 01/07/2016  - Personally Reviewed  ECG    SR LBBB old present 2012  - Personally Reviewed  Radiology    Nm Myocar Multi W/spect W/wall Motion / Ef  Result Date: 01/06/2016 CLINICAL DATA:  82 year old female with a history dyspnea on exertion. Cardiovascular risk factors include hypertension. EXAM: MYOCARDIAL IMAGING WITH SPECT (REST AND PHARMACOLOGIC-STRESS) GATED LEFT VENTRICULAR WALL MOTION STUDY LEFT VENTRICULAR EJECTION FRACTION TECHNIQUE: Standard myocardial SPECT imaging was performed after resting intravenous injection of 10 mCi Tc-17m tetrofosmin. Subsequently, intravenous infusion of Lexiscan was performed under the supervision of the Cardiology staff. At peak effect of the drug, 30 mCi Tc-39m tetrofosmin was injected intravenously and standard myocardial SPECT imaging was performed. Quantitative gated imaging was also performed to evaluate left ventricular wall motion, and estimate left ventricular ejection fraction. COMPARISON:   None. FINDINGS: Perfusion: Large reversible perfusion defect involving the inferior septal wall extending from mid ventricle to apex of moderate severity. There is associated transient ischemic dilation. Wall Motion: Hypokinetic apical motion. Left Ventricular Ejection Fraction: 99991111 % End diastolic volume 73 ml End systolic volume 36 ml IMPRESSION: 1. Reversible defect involving the inferior septal wall extending from mid ventricle to apex of moderate severity large size. There is associated transient ischemic dilation. 2. Hypokinetic apical wall. 3. Left ventricular ejection fraction 36% 4. Non invasive risk stratification*: High *2012 Appropriate Use Criteria for Coronary Revascularization Focused Update: J Am Coll Cardiol. [id number]. http://content.[email or web address].aspx?articleid=[email or web address] Electronically Signed   By: Corrie Mckusick D.O.   On: 01/06/2016 16:38    Cardiac Studies   Echo: EF 40-45% inferior hypokinesis ? Ischemic moderate MR grade 2 diastolic dysfunction  Patient Profile     82 y.o. admitted with what appears to be new onset CHF and LE edema  Assessment & Plan    CHF: combination of diastolic dysfunction , ? CAD with RWMA and MR. Myovue High risk with TID and infarct/ischemia in multiple regions. For cath today  Needs further w/u of anemia. Suspect CHF is a combination of ischemic MR Decreased EF and LBBB.  Would write for daily lasix 20 mg after cath.  Discussed With primary will check FOBT after suppository today   Signed, Jenkins Rouge, MD  01/07/2016, 9:27 AM  Patient ID: Kathleen Arias, female   DOB: 13-Jul-1925, 82 y.o.   MRN: [id number]

## 2016-01-08 DIAGNOSIS — D509 Iron deficiency anemia, unspecified: Secondary | ICD-10-CM

## 2016-01-08 DIAGNOSIS — E871 Hypo-osmolality and hyponatremia: Secondary | ICD-10-CM

## 2016-01-08 LAB — BASIC METABOLIC PANEL
ANION GAP: 4 — AB (ref 5–15)
BUN: 7 mg/dL (ref 6–20)
CALCIUM: 8.6 mg/dL — AB (ref 8.9–10.3)
CO2: 27 mmol/L (ref 22–32)
Chloride: 97 mmol/L — ABNORMAL LOW (ref 101–111)
Creatinine, Ser: 0.61 mg/dL (ref 0.44–1.00)
GLUCOSE: 128 mg/dL — AB (ref 65–99)
POTASSIUM: 4.2 mmol/L (ref 3.5–5.1)
Sodium: 128 mmol/L — ABNORMAL LOW (ref 135–145)

## 2016-01-08 MED ORDER — LOSARTAN POTASSIUM 50 MG PO TABS: 50.0000 mg | ORAL_TABLET | Freq: Every day | ORAL | Status: DC

## 2016-01-08 MED ORDER — CARVEDILOL 6.25 MG PO TABS: 6.2500 mg | ORAL_TABLET | Freq: Two times a day (BID) | ORAL | Status: DC

## 2016-01-08 MED ORDER — FERROUS SULFATE 325 (65 FE) MG PO TABS: 325.0000 mg | ORAL_TABLET | Freq: Every day | ORAL | 0 refills | Status: DC

## 2016-01-08 MED ORDER — FUROSEMIDE 20 MG PO TABS: 10.0000 mg | ORAL_TABLET | Freq: Every day | ORAL | 0 refills | Status: DC

## 2016-01-08 MED ORDER — LOSARTAN POTASSIUM 50 MG PO TABS: 50.0000 mg | ORAL_TABLET | Freq: Every day | ORAL | 0 refills | Status: DC

## 2016-01-08 MED ORDER — CARVEDILOL 6.25 MG PO TABS: 6.2500 mg | ORAL_TABLET | Freq: Two times a day (BID) | ORAL | 0 refills | Status: DC

## 2016-01-08 MED ORDER — FUROSEMIDE 20 MG PO TABS
10.0000 mg | ORAL_TABLET | Freq: Every day | ORAL | Status: DC
  Administered 2016-01-08: 10 mg via ORAL
  Filled 2016-01-08: qty 1

## 2016-01-08 NOTE — Evaluation (Signed)
Physical Therapy Evaluation Patient Details Name: Kathleen Arias MRN: [id number] DOB: 07-29-1925 Today's Date: 01/08/2016   History of Present Illness  Pt adm with chest pain and sob. Found to have new onset chf. PMH - HTN  Clinical Impression  Pt doing well with mobility and no further PT needed.  Ready for dc from PT standpoint.      Follow Up Recommendations No PT follow up    Equipment Recommendations  None recommended by PT    Recommendations for Other Services       Precautions / Restrictions Precautions Precautions: None Restrictions Weight Bearing Restrictions: No      Mobility  Bed Mobility Overal bed mobility: Modified Independent             General bed mobility comments: Incr time  Transfers Overall transfer level: Modified independent Equipment used: None                Ambulation/Gait Ambulation/Gait assistance: Supervision;Modified independent (Device/Increase time) Ambulation Distance (Feet): 150 Feet Assistive device: 1 person hand held assist;None Gait Pattern/deviations: Step-through pattern;Decreased stride length Gait velocity: below her baseline   General Gait Details: Initially pt needed hand held assist and reported stiffness due to being in the bed. Pt progressed to no assistive device and modified independent with incr distance  Stairs            Wheelchair Mobility    Modified Rankin (Stroke Patients Only)       Balance Overall balance assessment: Needs assistance Sitting-balance support: No upper extremity supported;Feet supported Sitting balance-Leahy Scale: Normal     Standing balance support: No upper extremity supported;During functional activity Standing balance-Leahy Scale: Good                               Pertinent Vitals/Pain Pain Assessment: Faces Faces Pain Scale: Hurts little more Pain Location: bilateral hips Pain Descriptors / Indicators: Grimacing Pain Intervention(s):  Limited activity within patient's tolerance    Home Living Family/patient expects to be discharged to:: Private residence Living Arrangements: Alone Available Help at Discharge: Family;Available PRN/intermittently Type of Home: House Home Access: Stairs to enter Entrance Stairs-Rails: Right Entrance Stairs-Number of Steps: 3 Home Layout: One level Home Equipment: Cane - single point      Prior Function Level of Independence: Independent with assistive device(s)         Comments: Uses straight outside on uneven terrain. Drives, shops, cooks, Comptroller        Extremity/Trunk Assessment   Upper Extremity Assessment Upper Extremity Assessment: Overall WFL for tasks assessed    Lower Extremity Assessment Lower Extremity Assessment: Overall WFL for tasks assessed       Communication   Communication: No difficulties  Cognition Arousal/Alertness: Awake/alert Behavior During Therapy: WFL for tasks assessed/performed Overall Cognitive Status: Within Functional Limits for tasks assessed                      General Comments General comments (skin integrity, edema, etc.): VSS    Exercises     Assessment/Plan    PT Assessment Patent does not need any further PT services  PT Problem List            PT Treatment Interventions      PT Goals (Current goals can be found in the Care Plan section)  Acute Rehab PT Goals PT Goal Formulation: All assessment and education  complete, DC therapy    Frequency     Barriers to discharge        Co-evaluation               End of Session   Activity Tolerance: Patient tolerated treatment well Patient left: in chair;with call bell/phone within reach;with family/visitor present Nurse Communication: Mobility status         Time: 1445-1457 PT Time Calculation (min) (ACUTE ONLY): 12 min   Charges:   PT Evaluation $PT Eval Low Complexity: 1 Procedure     PT G CodesShary Decamp  [place] 02/07/16, 3:29 PM [employer] PT [phone]

## 2016-01-08 NOTE — Progress Notes (Signed)
Patient discharge order received.  Discharge instructions reviewed with and given to patient.  Patient's daughter coming to pick up patient.

## 2016-01-08 NOTE — Progress Notes (Signed)
Patient Name: Kathleen Arias Date of Encounter: 01/08/2016  Primary Cardiologist: [facility] Problem List     Active Problems:   Chest pain   Shortness of breath   Localized swelling of lower extremity   Hyponatremia   Hyperglycemia   Musculoskeletal pain   Anemia, iron deficiency   Normocytic anemia   Abnormal stress test     Subjective   Feels well no chest pain   Inpatient Medications    Scheduled Meds: . aspirin EC  325 mg Oral Daily  . carvedilol  6.25 mg Oral BID WC  . furosemide  10 mg Oral Daily  . heparin  5,000 Units Subcutaneous Q8H  . levothyroxine  112 mcg Oral Once per day on Sun Tue Thu Sat  . levothyroxine  125 mcg Oral Once per day on Mon Wed Fri  . [START ON 01/09/2016] losartan  50 mg Oral Daily  . polyethylene glycol  17 g Oral BID  . sodium chloride flush  3 mL Intravenous Q12H  . sodium chloride flush  3 mL Intravenous Q12H   Continuous Infusions:  PRN Meds: sodium chloride, sodium chloride, [DISCONTINUED] acetaminophen **OR** acetaminophen, acetaminophen, gi cocktail, nitroGLYCERIN, ondansetron (ZOFRAN) IV, ondansetron **OR** [DISCONTINUED] ondansetron (ZOFRAN) IV, polyvinyl alcohol, senna-docusate, sodium chloride flush, sodium chloride flush   Vital Signs    Vitals:   01/07/16 1920 01/07/16 2142 01/08/16 0604 01/08/16 0836  BP: (!) 147/69 140/67 (!) 151/58 (!) 158/83  Pulse:  97 79   Resp:      Temp:  97.8 F (36.6 C) 97.4 F (36.3 C)   TempSrc:  Oral Oral   SpO2:  97% 98%   Weight:   137 lb 3.2 oz (62.2 kg)   Height:        Intake/Output Summary (Last 24 hours) at 01/08/16 0912 Last data filed at 01/08/16 [id number]  Gross per 24 hour  Intake             1076 ml  Output                0 ml  Net             1076 ml   Filed Weights   01/06/16 0544 01/07/16 0350 01/08/16 0604  Weight: 138 lb 8 oz (62.8 kg) 139 lb 12.8 oz (63.4 kg) 137 lb 3.2 oz (62.2 kg)    Physical Exam    GEN: Well nourished, well developed, in no  acute distress.  HEENT: Grossly normal.  Neck: Supple, no JVD, carotid bruits, or masses. Cardiac: RRR, no murmurs, rubs, or gallops. No clubbing, cyanosis, edema.  Radials/DP/PT 2+ and equal bilaterally.  Respiratory:  Respirations regular and unlabored, clear to auscultation bilaterally. GI: Soft, nontender, nondistended, BS + x 4. MS: no deformity or atrophy. Skin: warm and dry, no rash. Neuro:  Strength and sensation are intact. Psych: AAOx3.  Normal affect.  Labs    CBC  Recent Labs  01/06/16 0416 01/07/16 0456  WBC 8.4 8.4  HGB 8.5* 9.3*  HCT 26.0* 28.4*  MCV 79.3 78.2  PLT 119* 0000000*   Basic Metabolic Panel  Recent Labs  01/06/16 0416 01/07/16 0456  NA 129* 129*  K 3.2* 3.9  CL 93* 94*  CO2 28 28  GLUCOSE 88 100*  BUN 8 7  CREATININE 0.67 0.64  CALCIUM 8.5* 8.7*   Cardiac Enzymes  Recent Labs  01/05/16 1350 01/05/16 1734 01/05/16 2240  TROPONINI <0.03 <0.03 <0.03   Hemoglobin A1C  Recent Labs  01/05/16 1350  HGBA1C 4.9   Thyroid Function Tests  Recent Labs  01/05/16 0953  TSH 3.156    Telemetry    NSR 01/08/2016  - Personally Reviewed  ECG    SR LBBB old present 2012  - Personally Reviewed  Radiology    Nm Myocar Multi W/spect W/wall Motion / Ef  Result Date: 01/06/2016 CLINICAL DATA:  82 year old female with a history dyspnea on exertion. Cardiovascular risk factors include hypertension. EXAM: MYOCARDIAL IMAGING WITH SPECT (REST AND PHARMACOLOGIC-STRESS) GATED LEFT VENTRICULAR WALL MOTION STUDY LEFT VENTRICULAR EJECTION FRACTION TECHNIQUE: Standard myocardial SPECT imaging was performed after resting intravenous injection of 10 mCi Tc-32m tetrofosmin. Subsequently, intravenous infusion of Lexiscan was performed under the supervision of the Cardiology staff. At peak effect of the drug, 30 mCi Tc-62m tetrofosmin was injected intravenously and standard myocardial SPECT imaging was performed. Quantitative gated imaging was also  performed to evaluate left ventricular wall motion, and estimate left ventricular ejection fraction. COMPARISON:  None. FINDINGS: Perfusion: Large reversible perfusion defect involving the inferior septal wall extending from mid ventricle to apex of moderate severity. There is associated transient ischemic dilation. Wall Motion: Hypokinetic apical motion. Left Ventricular Ejection Fraction: [id number] % End diastolic volume 73 ml End systolic volume 36 ml IMPRESSION: 1. Reversible defect involving the inferior septal wall extending from mid ventricle to apex of moderate severity large size. There is associated transient ischemic dilation. 2. Hypokinetic apical wall. 3. Left ventricular ejection fraction 36% 4. Non invasive risk stratification*: High *2012 Appropriate Use Criteria for Coronary Revascularization Focused Update: J Am Coll Cardiol. [id number]. [email or web address] Electronically Signed   By: Corrie Mckusick D.O.   On: 01/06/2016 16:38    Cardiac Studies   Echo: EF 40-45% inferior hypokinesis ? Ischemic moderate MR grade 2 diastolic dysfunction  Patient Profile     82 y.o. admitted with what appears to be new onset CHF and LE edema  Assessment & Plan    CHF:  Cath with no CAD likely Takatsubo DCM. Have adjusted meds with ARB, beta blocker And low dose diuretic. Will arrange outpatient f/u. ? Still needs further w/u of anemia No further cardiac studies planned  Signed, Jenkins Rouge, MD  01/08/2016, 9:12 AM  Patient ID: Kathleen Arias, female   DOB: 27-May-1925, 82 y.o.   MRN: [id number]

## 2016-01-08 NOTE — Discharge Instructions (Signed)
Radial Site Care °Introduction °Refer to this sheet in the next few weeks. These instructions provide you with information about caring for yourself after your procedure. Your health care provider may also give you more specific instructions. Your treatment has been planned according to current medical practices, but problems sometimes occur. Call your health care provider if you have any problems or questions after your procedure. °What can I expect after the procedure? °After your procedure, it is typical to have the following: °· Bruising at the radial site that usually fades within 1-2 weeks. °· Blood collecting in the tissue (hematoma) that may be painful to the touch. It should usually decrease in size and tenderness within 1-2 weeks. °Follow these instructions at home: °· Take medicines only as directed by your health care provider. °· You may shower 24-48 hours after the procedure or as directed by your health care provider. Remove the bandage (dressing) and gently wash the site with plain soap and water. Pat the area dry with a clean towel. Do not rub the site, because this may cause bleeding. °· Do not take baths, swim, or use a hot tub until your health care provider approves. °· Check your insertion site every day for redness, swelling, or drainage. °· Do not apply powder or lotion to the site. °· Do not flex or bend the affected arm for 24 hours or as directed by your health care provider. °· Do not push or pull heavy objects with the affected arm for 24 hours or as directed by your health care provider. °· Do not lift over 10 lb (4.5 kg) for 5 days after your procedure or as directed by your health care provider. °· Ask your health care provider when it is okay to: °¨ Return to work or school. °¨ Resume usual physical activities or sports. °¨ Resume sexual activity. °· Do not drive home if you are discharged the same day as the procedure. Have someone else drive you. °· You may drive 24 hours after the  procedure unless otherwise instructed by your health care provider. °· Do not operate machinery or power tools for 24 hours after the procedure. °· If your procedure was done as an outpatient procedure, which means that you went home the same day as your procedure, a responsible adult should be with you for the first 24 hours after you arrive home. °· Keep all follow-up visits as directed by your health care provider. This is important. °Contact a health care provider if: °· You have a fever. °· You have chills. °· You have increased bleeding from the radial site. Hold pressure on the site. °Get help right away if: °· You have unusual pain at the radial site. °· You have redness, warmth, or swelling at the radial site. °· You have drainage (other than a small amount of blood on the dressing) from the radial site. °· The radial site is bleeding, and the bleeding does not stop after 30 minutes of holding steady pressure on the site. °· Your arm or hand becomes pale, cool, tingly, or numb. °This information is not intended to replace advice given to you by your health care provider. Make sure you discuss any questions you have with your health care provider. °Document Released: 02/05/2010 Document Revised: 06/11/2015 Document Reviewed: 07/22/2013 °© 2017 [employer]vier ° ° ° °Heart-Healthy Eating Plan °Introduction °Heart-healthy meal planning includes: °· Limiting unhealthy fats. °· Increasing healthy fats. °· Making other small dietary changes. °You may need to talk with   your doctor or a diet specialist (dietitian) to create an eating plan that is right for you. °What types of fat should I choose? °· Choose healthy fats. These include olive oil and canola oil, flaxseeds, walnuts, almonds, and seeds. °· Eat more omega-3 fats. These include salmon, mackerel, sardines, tuna, flaxseed oil, and ground flaxseeds. Try to eat fish at least twice each week. °· Limit saturated fats. °¨ Saturated fats are often found in animal  products, such as meats, butter, and cream. °¨ Plant sources of saturated fats include palm oil, palm kernel oil, and coconut oil. °· Avoid foods with partially hydrogenated oils in them. These include stick margarine, some tub margarines, cookies, crackers, and other baked goods. These contain trans fats. °What general guidelines do I need to follow? °· Check food labels carefully. Identify foods with trans fats or high amounts of saturated fat. °· Fill one half of your plate with vegetables and green salads. Eat 4-5 servings of vegetables per day. A serving of vegetables is: °¨ 1 cup of raw leafy vegetables. °¨ ½ cup of raw or cooked cut-up vegetables. °¨ ½ cup of vegetable juice. °· Fill one fourth of your plate with whole grains. Look for the word "whole" as the first word in the ingredient list. °· Fill one fourth of your plate with lean protein foods. °· Eat 4-5 servings of fruit per day. A serving of fruit is: °¨ One medium whole fruit. °¨ ¼ cup of dried fruit. °¨ ½ cup of fresh, frozen, or canned fruit. °¨ ½ cup of 100% fruit juice. °· Eat more foods that contain soluble fiber. These include apples, broccoli, carrots, beans, peas, and barley. Try to get 20-30 g of fiber per day. °· Eat more home-cooked food. Eat less restaurant, buffet, and fast food. °· Limit or avoid alcohol. °· Limit foods high in starch and sugar. °· Avoid fried foods. °· Avoid frying your food. Try baking, boiling, grilling, or broiling it instead. You can also reduce fat by: °¨ Removing the skin from poultry. °¨ Removing all visible fats from meats. °¨ Skimming the fat off of stews, soups, and gravies before serving them. °¨ Steaming vegetables in water or broth. °· Lose weight if you are overweight. °· Eat 4-5 servings of nuts, legumes, and seeds per week: °¨ One serving of dried beans or legumes equals ½ cup after being cooked. °¨ One serving of nuts equals 1½ ounces. °¨ One serving of seeds equals ½ ounce or one tablespoon. °· You  may need to keep track of how much salt or sodium you eat. This is especially true if you have high blood pressure. Talk with your doctor or dietitian to get more information. °What foods can I eat? °Grains  °Breads, including French, white, pita, wheat, raisin, rye, oatmeal, and Italian. Tortillas that are neither fried nor made with lard or trans fat. Low-fat rolls, including hotdog and hamburger buns and English muffins. Biscuits. Muffins. Waffles. Pancakes. Light popcorn. Whole-grain cereals. Flatbread. Melba toast. Pretzels. Breadsticks. Rusks. Low-fat snacks. Low-fat crackers, including oyster, saltine, matzo, graham, animal, and rye. Rice and pasta, including brown rice and pastas that are made with whole wheat. °Vegetables  °All vegetables. °Fruits  °All fruits, but limit coconut. °Meats and Other Protein Sources  °Lean, well-trimmed beef, veal, pork, and lamb. Chicken and turkey without skin. All fish and shellfish. Wild duck, rabbit, pheasant, and venison. Egg whites or low-cholesterol egg substitutes. Dried beans, peas, lentils, and tofu. Seeds and most nuts. °Dairy  °  Low-fat or nonfat cheeses, including ricotta, string, and mozzarella. Skim or 1% milk that is liquid, powdered, or evaporated. Buttermilk that is made with low-fat milk. Nonfat or low-fat yogurt. °Beverages  °Mineral water. Diet carbonated beverages. °Sweets and Desserts  °Sherbets and fruit ices. Honey, jam, marmalade, jelly, and syrups. Meringues and gelatins. Pure sugar candy, such as hard candy, jelly beans, gumdrops, mints, marshmallows, and small amounts of dark chocolate. Angel food cake. °Eat all sweets and desserts in moderation. °Fats and Oils  °Nonhydrogenated (trans-free) margarines. Vegetable oils, including soybean, sesame, sunflower, olive, peanut, safflower, corn, canola, and cottonseed. Salad dressings or mayonnaise made with a vegetable oil. Limit added fats and oils that you use for cooking, baking, salads, and as  spreads. °Other  °Cocoa powder. Coffee and tea. All seasonings and condiments. °The items listed above may not be a complete list of recommended foods or beverages. Contact your dietitian for more options.  °What foods are not recommended? °Grains  °Breads that are made with saturated or trans fats, oils, or whole milk. Croissants. Butter rolls. Cheese breads. Sweet rolls. Donuts. Buttered popcorn. Chow mein noodles. High-fat crackers, such as cheese or butter crackers. °Meats and Other Protein Sources  °Fatty meats, such as hotdogs, short ribs, sausage, spareribs, bacon, rib eye roast or steak, and mutton. High-fat deli meats, such as salami and bologna. Caviar. Domestic duck and goose. Organ meats, such as kidney, liver, sweetbreads, and heart. °Dairy  °Cream, sour cream, cream cheese, and creamed cottage cheese. Whole-milk cheeses, including blue (bleu), Monterey Jack, Brie, [place], American, Havarti, Swiss, cheddar, Camembert, and [place]. Whole or 2% milk that is liquid, evaporated, or condensed. Whole buttermilk. Cream sauce or high-fat cheese sauce. Yogurt that is made from whole milk. °Beverages  °Regular sodas and juice drinks with added sugar. °Sweets and Desserts  °Frosting. Pudding. Cookies. Cakes other than angel food cake. Candy that has milk chocolate or white chocolate, hydrogenated fat, butter, coconut, or unknown ingredients. Buttered syrups. Full-fat ice cream or ice cream drinks. °Fats and Oils  °Gravy that has suet, meat fat, or shortening. Cocoa butter, hydrogenated oils, palm oil, coconut oil, palm kernel oil. These can often be found in baked products, candy, fried foods, nondairy creamers, and whipped toppings. Solid fats and shortenings, including bacon fat, salt pork, lard, and butter. Nondairy cream substitutes, such as coffee creamers and sour cream substitutes. Salad dressings that are made of unknown oils, cheese, or sour cream. °The items listed above may not be a complete list of  foods and beverages to avoid. Contact your dietitian for more information.  °This information is not intended to replace advice given to you by your health care provider. Make sure you discuss any questions you have with your health care provider. °Document Released: 07/05/2011 Document Revised: 06/11/2015 Document Reviewed: 06/27/2013 °© 2017 [employer]vier ° °

## 2016-01-08 NOTE — Discharge Summary (Signed)
Physician Discharge Summary  Kathleen Arias [id number] DOB: November 07, 1925 DOA: 01/05/2016  PCP: No primary care provider on file.  Admit date: 01/05/2016 Discharge date: 01/08/2016  Admitted From: Home.  Disposition:  Home.   Recommendations for Outpatient Follow-up:  1. Follow up with PCP in 1-2 weeks 2. Please obtain BMP/CBC in one week    Discharge Condition:guarded.  CODE STATUS:full code.  Diet recommendation: regular diet.   Brief/Interim Summary: Kathleen Arias is an 82 y.o. female past medical history of essential hypertension hypothyroidism comes in with primary complaint of chest pain.   Discharge Diagnoses:  Active Problems:   Chest pain   Shortness of breath   Localized swelling of lower extremity   Hyponatremia   Hyperglycemia   Musculoskeletal pain   Anemia, iron deficiency   Normocytic anemia   Abnormal stress test  Chest pain: Cardiology was consulted recommended a stress test that showed reversible signs of ischemia, and cardiology recommended cardiac cath. She underwent cardiac cath on 12/21 showing.    There is moderate to severe left ventricular systolic dysfunction.  The left ventricular ejection fraction is 25-35% by visual estimate.  LV end diastolic pressure is mildly elevated.  Mid RCA lesion, 25 %stenosed.  Ost 2nd Diag to 2nd Diag lesion, 25 %stenosed.  Mid LAD lesion, 40 %stenosed.  Mid Cx to Dist Cx lesion, 25 %stenosed.   Nonischemic cardiomyopathy.   recommendations to Continue medical therapy.  Chronic combined systolic and diastolic heart failure: Duplex was negative, echo showed a EF of 40-45%. Cardiology coreg low dose. Creatinine less than 1, continue ARB.  Hyponatremia: Likely due to hydrochlorothiazide improving, please folllow up with PCP and get BMP done.   Hypoglycemia: A1c of 4.9. Resolved.   Hypothyroidism: Continue Synthroid.  Essential hypertension: Well controlled.   Normocytic anemia: With a  borderline MCV and elevated RDW concern about iron deficiency anemia check an anemia panel. Anemia panel shows normal to low iron and ferritin, stool for occult blood negative.  Recommend iron supplementation with stool softeners. Please check hemoglobin in 4 weeks and if still low will need outpatient follow up with hematology.   Discharge Instructions  Discharge Instructions    Diet general    Complete by:  As directed    Discharge instructions    Complete by:  As directed    Please follow up with PCP regarding the low sodium level in one week.  Please obtain a bmp at PCP office.     Allergies as of 01/08/2016   No Known Allergies     Medication List    STOP taking these medications   amoxicillin 500 MG capsule Commonly known as:  AMOXIL   diclofenac 75 MG EC tablet Commonly known as:  VOLTAREN   GINKGO BILOBA PO   hydrochlorothiazide 25 MG tablet Commonly known as:  HYDRODIURIL   meloxicam 15 MG tablet Commonly known as:  MOBIC     TAKE these medications   aspirin EC 325 MG tablet Take 325 mg by mouth every other day.   B COMPLEX-C-CALCIUM PO Take 1 tablet by mouth daily.   carvedilol 6.25 MG tablet Commonly known as:  COREG Take 1 tablet (6.25 mg total) by mouth 2 (two) times daily with a meal.   ferrous sulfate 325 (65 FE) MG tablet Take 1 tablet (325 mg total) by mouth daily with breakfast.   furosemide 20 MG tablet Commonly known as:  LASIX Take 0.5 tablets (10 mg total) by mouth daily. Start taking on:  01/09/2016  levothyroxine 112 MCG tablet Commonly known as:  SYNTHROID, LEVOTHROID Take 112 mcg by mouth See admin instructions. Sun/Tues/Thurs/Sat   levothyroxine 125 MCG tablet Commonly known as:  SYNTHROID, LEVOTHROID Take 125 mcg by mouth every Monday, Wednesday, and Friday.   losartan 50 MG tablet Commonly known as:  COZAAR Take 1 tablet (50 mg total) by mouth daily. Start taking on:  01/09/2016   POTASSIUM PO Take 99 mg by mouth 2  (two) times a week.   vitamin E 1000 UNIT capsule Take 1,000 Units by mouth every other day.       No Known Allergies  Consultations:  cardiology   Procedures/Studies: Dg Chest 2 View  Result Date: 01/05/2016 CLINICAL DATA:  Chest pain and lower extremity swelling. Dyspnea for 1 week. EXAM: CHEST  2 VIEW COMPARISON:  None. FINDINGS: The lungs are clear. The pulmonary vasculature is normal. There is no pleural effusion. There is borderline heart size. Hilar and mediastinal contours are unremarkable. There is chronic compression of a lower thoracic vertebral body. IMPRESSION: No acute findings. Electronically Signed   By: Andreas Newport M.D.   On: 01/05/2016 01:34   Nm Myocar Multi W/spect W/wall Motion / Ef  Result Date: 01/06/2016 CLINICAL DATA:  82 year old female with a history dyspnea on exertion. Cardiovascular risk factors include hypertension. EXAM: MYOCARDIAL IMAGING WITH SPECT (REST AND PHARMACOLOGIC-STRESS) GATED LEFT VENTRICULAR WALL MOTION STUDY LEFT VENTRICULAR EJECTION FRACTION TECHNIQUE: Standard myocardial SPECT imaging was performed after resting intravenous injection of 10 mCi Tc-47m tetrofosmin. Subsequently, intravenous infusion of Lexiscan was performed under the supervision of the Cardiology staff. At peak effect of the drug, 30 mCi Tc-13m tetrofosmin was injected intravenously and standard myocardial SPECT imaging was performed. Quantitative gated imaging was also performed to evaluate left ventricular wall motion, and estimate left ventricular ejection fraction. COMPARISON:  None. FINDINGS: Perfusion: Large reversible perfusion defect involving the inferior septal wall extending from mid ventricle to apex of moderate severity. There is associated transient ischemic dilation. Wall Motion: Hypokinetic apical motion. Left Ventricular Ejection Fraction: 99991111 % End diastolic volume 73 ml End systolic volume 36 ml IMPRESSION: 1. Reversible defect involving the inferior  septal wall extending from mid ventricle to apex of moderate severity large size. There is associated transient ischemic dilation. 2. Hypokinetic apical wall. 3. Left ventricular ejection fraction 36% 4. Non invasive risk stratification*: High *2012 Appropriate Use Criteria for Coronary Revascularization Focused Update: J Am Coll Cardiol. N6492421. http://[email or web address]?articleid=[email or web address] Electronically Signed   By: Corrie Mckusick D.O.   On: 01/06/2016 16:38      Subjective: No new complaints .   Discharge Exam: Vitals:   01/08/16 0836 01/08/16 1300  BP: (!) 158/83 140/81  Pulse:  71  Resp:  16  Temp:  97.9 F (36.6 C)   Vitals:   01/07/16 2142 01/08/16 0604 01/08/16 0836 01/08/16 1300  BP: 140/67 (!) 151/58 (!) 158/83 140/81  Pulse: 97 79  71  Resp:    16  Temp: 97.8 F (36.6 C) 97.4 F (36.3 C)  97.9 F (36.6 C)  TempSrc: Oral Oral  Oral  SpO2: 97% 98%  99%  Weight:  62.2 kg (137 lb 3.2 oz)    Height:        General: Pt is alert, awake, not in acute distress Cardiovascular: RRR, S1/S2 +, no rubs, no gallops Respiratory: CTA bilaterally, no wheezing, no rhonchi Abdominal: Soft, NT, ND, bowel sounds + Extremities: no edema, no cyanosis    The results of significant diagnostics from this  hospitalization (including imaging, microbiology, ancillary and laboratory) are listed below for reference.     Microbiology: No results found for this or any previous visit (from the past 240 hour(s)).   Labs: BNP (last 3 results)  Recent Labs  01/05/16 0953  BNP 123456*   Basic Metabolic Panel:  Recent Labs Lab 01/05/16 0037 01/05/16 1734 01/06/16 0416 01/07/16 0456 01/08/16 1242  NA 126* 127* 129* 129* 128*  K 4.0 3.9 3.2* 3.9 4.2  CL 91* 91* 93* 94* 97*  CO2 25 28 28 28 27   GLUCOSE 131* 121* 88 100* 128*  BUN 13 9 8 7 7   CREATININE 0.84 0.74 0.67 0.64 0.61  CALCIUM 9.3 8.8* 8.5* 8.7* 8.6*   Liver Function Tests: No results for  input(s): AST, ALT, ALKPHOS, BILITOT, PROT, ALBUMIN in the last 168 hours. No results for input(s): LIPASE, AMYLASE in the last 168 hours. No results for input(s): AMMONIA in the last 168 hours. CBC:  Recent Labs Lab 01/05/16 0037 01/06/16 0416 01/07/16 0456  WBC 11.6* 8.4 8.4  HGB 10.1* 8.5* 9.3*  HCT 31.5* 26.0* 28.4*  MCV 78.2 79.3 78.2  PLT 154 119* 137*   Cardiac Enzymes:  Recent Labs Lab 01/05/16 1350 01/05/16 1734 01/05/16 2240  TROPONINI <0.03 <0.03 <0.03   BNP: Invalid input(s): POCBNP CBG:  Recent Labs Lab 01/05/16 1709  GLUCAP 87   D-Dimer No results for input(s): DDIMER in the last 72 hours. Hgb A1c No results for input(s): HGBA1C in the last 72 hours. Lipid Profile No results for input(s): CHOL, HDL, LDLCALC, TRIG, CHOLHDL, LDLDIRECT in the last 72 hours. Thyroid function studies No results for input(s): TSH, T4TOTAL, T3FREE, THYROIDAB in the last 72 hours.  Invalid input(s): FREET3 Anemia work up  Recent Labs  01/07/16 1020  VITAMINB12 509  FOLATE 26.1  FERRITIN 57  TIBC 451*  IRON 34  RETICCTPCT 3.6*   Urinalysis    Component Value Date/Time   COLORURINE YELLOW 08/21/2010 1229   APPEARANCEUR CLOUDY (A) 08/21/2010 1229   LABSPEC 1.025 08/21/2010 1229   PHURINE 6.0 08/21/2010 1229   GLUCOSEU NEGATIVE 08/21/2010 1229   HGBUR NEGATIVE 08/21/2010 1229   BILIRUBINUR NEGATIVE 08/21/2010 1229   KETONESUR NEGATIVE 08/21/2010 1229   PROTEINUR NEGATIVE 08/21/2010 1229   UROBILINOGEN 0.2 08/21/2010 1229   NITRITE NEGATIVE 08/21/2010 1229   LEUKOCYTESUR LARGE (A) 08/21/2010 1229   Sepsis Labs Invalid input(s): PROCALCITONIN,  WBC,  LACTICIDVEN Microbiology No results found for this or any previous visit (from the past 240 hour(s)).   Time coordinating discharge: Over 30 minutes  SIGNED:   Hosie Poisson, MD  [facility] 01/08/2016, 4:03 PM Pager   If 7PM-7AM, please contact night-coverage [email or web address] Password [email or web address]

## 2016-01-13 MED FILL — Heparin Sodium (Porcine) 2 Unit/ML in Sodium Chloride 0.9%: INTRAMUSCULAR | Qty: 500 | Status: AC

## 2016-01-20 ENCOUNTER — Encounter: Payer: Medicare Other | Admitting: Physician Assistant

## 2016-01-20 ENCOUNTER — Encounter: Payer: Medicare Other | Admitting: Nurse Practitioner

## 2016-01-20 NOTE — Progress Notes (Deleted)
CARDIOLOGY OFFICE NOTE  Date:  01/20/2016    Kathleen Arias Date of Birth: 1925/07/12 Medical Record [id number]  PCP:  No primary care provider on file.  Cardiologist:  Servando Snare & ***    No chief complaint on file.   History of Present Illness: Kathleen Arias is a 83 y.o. female who presents today for a ***   Comes in today. Here with   Past Medical History:  Diagnosis Date  . Arthritis   . Hypertension   . Thyroid disease     Past Surgical History:  Procedure Laterality Date  . CARDIAC CATHETERIZATION N/A 01/07/2016   Procedure: Left Heart Cath and Coronary Angiography;  Surgeon: Jettie Booze, MD;  Location: [facility];  Service: Cardiovascular;  Laterality: N/A;  . CATARACT EXTRACTION, BILATERAL    . HIP SURGERY       Medications: Current Outpatient Prescriptions  Medication Sig Dispense Refill  . aspirin EC 325 MG tablet Take 325 mg by mouth every other day.    . B COMPLEX-C-CALCIUM PO Take 1 tablet by mouth daily.    . carvedilol (COREG) 6.25 MG tablet Take 1 tablet (6.25 mg total) by mouth 2 (two) times daily with a meal. 60 tablet 0  . ferrous sulfate 325 (65 FE) MG tablet Take 1 tablet (325 mg total) by mouth daily with breakfast. 30 tablet 0  . furosemide (LASIX) 20 MG tablet Take 0.5 tablets (10 mg total) by mouth daily. 30 tablet 0  . levothyroxine (SYNTHROID, LEVOTHROID) 112 MCG tablet Take 112 mcg by mouth See admin instructions. Sun/Tues/Thurs/Sat    . levothyroxine (SYNTHROID, LEVOTHROID) 125 MCG tablet Take 125 mcg by mouth every Monday, Wednesday, and Friday.     . losartan (COZAAR) 50 MG tablet Take 1 tablet (50 mg total) by mouth daily. 30 tablet 0  . POTASSIUM PO Take 99 mg by mouth 2 (two) times a week.    . vitamin E 1000 UNIT capsule Take 1,000 Units by mouth every other day.     No current facility-administered medications for this visit.     Allergies: No Known Allergies  Social History: The patient  reports that she  has quit smoking. She has never used smokeless tobacco. She reports that she does not drink alcohol or use drugs.   Family History: The patient's ***family history includes Heart disease in her mother.   Review of Systems: Please see the history of present illness.   Otherwise, the review of systems is positive for {NONE DEFAULTED:18576::"none"}.   All other systems are reviewed and negative.   Physical Exam: VS:  There were no vitals taken for this visit. Marland Kitchen  BMI There is no height or weight on file to calculate BMI.  Wt Readings from Last 3 Encounters:  01/08/16 137 lb 3.2 oz (62.2 kg)    General: Pleasant. Well developed, well nourished and in no acute distress.   HEENT: Normal.  Neck: Supple, no JVD, carotid bruits, or masses noted.  Cardiac: ***Regular rate and rhythm. No murmurs, rubs, or gallops. No edema.  Respiratory:  Lungs are clear to auscultation bilaterally with normal work of breathing.  GI: Soft and nontender.  MS: No deformity or atrophy. Gait and ROM intact.  Skin: Warm and dry. Color is normal.  Neuro:  Strength and sensation are intact and no gross focal deficits noted.  Psych: Alert, appropriate and with normal affect.   LABORATORY DATA:  EKG:  EKG {ACTION; IS/IS [id number] ordered today.  This demonstrates ***.  Lab Results  Component Value Date   WBC 8.4 01/07/2016   HGB 9.3 (L) 01/07/2016   HCT 28.4 (L) 01/07/2016   PLT 137 (L) 01/07/2016   GLUCOSE 128 (H) 01/08/2016   NA 128 (L) 01/08/2016   K 4.2 01/08/2016   CL 97 (L) 01/08/2016   CREATININE 0.61 01/08/2016   BUN 7 01/08/2016   CO2 27 01/08/2016   TSH 3.156 01/05/2016   INR 1.01 01/07/2016   HGBA1C 4.9 01/05/2016    BNP (last 3 results)  Recent Labs  01/05/16 0953  BNP 450.1*    ProBNP (last 3 results) No results for input(s): PROBNP in the last 8760 hours.   Other Studies Reviewed Today:   Assessment/Plan:   Current medicines are reviewed with the patient today.  The  patient does not have concerns regarding medicines other than what has been noted above.  The following changes have been made:  See above.  Labs/ tests ordered today include:   No orders of the defined types were placed in this encounter.    Disposition:   FU with *** in {gen number [id number] {Days to years:10300}.   Patient is agreeable to this plan and will call if any problems develop in the interim.   Signed: Burtis Junes, RN, ANP-C 01/20/2016 7:46 AM  [facility] [address] [place], [place]  [address] Phone: [phone] Fax: [phone]

## 2016-01-27 NOTE — Progress Notes (Signed)
Cardiology Office Note    Date:  01/28/2016   ID:  Kathleen Arias, DOB 18-Nov-1925, MRN [id number]  PCP:  Woody Seller, MD  Cardiologist: Dr. Johnsie Cancel   Chief Complaint: Hospital follow up for CHF  History of Present Illness:   Kathleen Arias is a 83 y.o. female with PHM of HTN and hypothyroidism who recently admitted presents for follow up.   Initially presented for chest pain. EKG showed LBBB. Echo showed EF 40-45% inferior hypokinesis ? Ischemic moderate MR grade 2 diastolic dysfunction. BNP minimally elevated to 450. Given IV lasix,. Myoview high risk with TID and infarct/ischemia in multiple regions. Even new onset CHF, LBBB and abnormal Myoview in May for cardiac catheterization for definite evaluation of coronary anatomy. Cath showed mild nonobstructive CAD.?  Takatsubo DCM. Discharged on BB and ACE.   Here today for follow-up. He has been feeling weak and fatigued. He has exertional dyspnea that has been improving slowly. Denies any chest pain. Has lower extremity edema mostly in evening. Better in the morning. She has not been eating excessive salt. Elevates her legs. Compliant with medication. Patient denies orthopnea, PND, syncope, blood in her stool or urine. She has a dark stools due to iron tablets.   Past Medical History:  Diagnosis Date  . Arthritis   . Hypertension   . Thyroid disease     Past Surgical History:  Procedure Laterality Date  . CARDIAC CATHETERIZATION N/A 01/07/2016   Procedure: Left Heart Cath and Coronary Angiography;  Surgeon: Jettie Booze, MD;  Location: [facility];  Service: Cardiovascular;  Laterality: N/A;  . CATARACT EXTRACTION, BILATERAL    . HIP SURGERY      Current Medications: Prior to Admission medications   Medication Sig Start Date End Date Taking? Authorizing Provider  aspirin EC 325 MG tablet Take 325 mg by mouth every other day.    Historical Provider, MD  B COMPLEX-C-CALCIUM PO Take 1 tablet by mouth daily.     Historical Provider, MD  carvedilol (COREG) 6.25 MG tablet Take 1 tablet (6.25 mg total) by mouth 2 (two) times daily with a meal. 01/08/16   Hosie Poisson, MD  ferrous sulfate 325 (65 FE) MG tablet Take 1 tablet (325 mg total) by mouth daily with breakfast. 01/08/16   Hosie Poisson, MD  furosemide (LASIX) 20 MG tablet Take 0.5 tablets (10 mg total) by mouth daily. 01/09/16   Hosie Poisson, MD  levothyroxine (SYNTHROID, LEVOTHROID) 112 MCG tablet Take 112 mcg by mouth See admin instructions. Sun/Tues/Thurs/Sat    Historical Provider, MD  levothyroxine (SYNTHROID, LEVOTHROID) 125 MCG tablet Take 125 mcg by mouth every Monday, Wednesday, and Friday.  08/21/15   Historical Provider, MD  losartan (COZAAR) 50 MG tablet Take 1 tablet (50 mg total) by mouth daily. 01/09/16   Hosie Poisson, MD  POTASSIUM PO Take 99 mg by mouth 2 (two) times a week.    Historical Provider, MD  vitamin E 1000 UNIT capsule Take 1,000 Units by mouth every other day.    Historical Provider, MD    Allergies:   Patient has no known allergies.   Social History   Social History  . Marital status: Single    Spouse name: N/A  . Number of children: N/A  . Years of education: N/A   Social History Main Topics  . Smoking status: Former Research scientist (life sciences)  . Smokeless tobacco: Never Used  . Alcohol use No  . Drug use: No  . Sexual activity: Not on file  Other Topics Concern  . Not on file   Social History Narrative  . No narrative on file     Family History:  The patient's family history includes Heart disease in her mother.   ROS:   Please see the history of present illness.    ROS All other systems reviewed and are negative.   PHYSICAL EXAM:   VS:  BP 100/64   Pulse 94   Ht 5\' 3"  (1.6 m)   Wt 139 lb (63 kg)   BMI 24.62 kg/m    GEN: Well nourished, well developed, in no acute distress  HEENT: normal  Neck: no JVD, carotid bruits, or masses Cardiac: RRR; no murmurs, rubs, or gallops, trace bilateral leg edema  And 1+  bilateral feed edema Respiratory:  clear to auscultation bilaterally, normal work of breathing GI: soft, nontender, nondistended, + BS MS: no deformity or atrophy  Skin: warm and dry, no rash Neuro:  Alert and Oriented x 3, Strength and sensation are intact Psych: euthymic mood, full affect  Wt Readings from Last 3 Encounters:  01/28/16 139 lb (63 kg)  01/08/16 137 lb 3.2 oz (62.2 kg)      Studies/Labs Reviewed:   EKG:  EKG is ordered today.  The ekg ordered today demonstrates not  Recent Labs: 01/05/2016: B Natriuretic Peptide 450.1; TSH 3.156 01/07/2016: Hemoglobin 9.3; Platelets 137 01/08/2016: BUN 7; Creatinine, Ser 0.61; Potassium 4.2; Sodium 128   Lipid Panel No results found for: CHOL, TRIG, HDL, CHOLHDL, VLDL, LDLCALC, LDLDIRECT  Additional studies/ records that were reviewed today include:   Echocardiogram: 01/05/16 LV EF: 40% -   45%  ------------------------------------------------------------------- Indications:      CHF - 428.0.  ------------------------------------------------------------------- History:   Risk factors:  Hypertension.  ------------------------------------------------------------------- Study Conclusions  - Procedure narrative: Transthoracic echocardiography. Image   quality was fair. The study was technically difficult, as a   result of restricted patient mobility. Intravenous contrast   (Definity) was administered. - Left ventricle: The cavity size was normal. Wall thickness was   increased in a pattern of mild LVH. Systolic function was mildly   to moderately reduced. The estimated ejection fraction was in the   range of 40% to 45%. No apical thrombus noted. Severe inferior   hypokinesis to akinesis. Incoordinate septal motion. Doppler   parameters are consistent with psuedonormal left ventricular   relaxation (grade 2 diastolic dysfunction). The E/e&' ratio is   >15, suggesting elevated LV filling pressure. - Aortic valve:  Trileaflet. Sclerosis without stenosis. There was   trivial regurgitation. - Mitral valve: Mildly thickened leaflets . There is ischemic   tethering of the posterior leaflet. There was moderate   regurgitation. - Left atrium: Moderately dilated. - Right atrium: The atrium was mildly dilated. - Tricuspid valve: There was mild regurgitation. - Pulmonary arteries: PA peak pressure: 53 mm Hg (S). - Inferior vena cava: The vessel was normal in size. The   respirophasic diameter changes were in the normal range (>= 50%),   consistent with normal central venous pressure.  Impressions:  - Technically difficult study. Definity contrast given. LVEF   40-45%, mild LVH, severe inferior hypokinesis to akinesis, Grade   2 DD with elevated LV filling presure, trivial AI, moderate   ischemic MR with posterior leaflet tethering and a posteriorly   directed jet, moderate LAE, mild TR, RVSP 53 mmHg, normal IVC.   Cardiac Catheterization: 01/07/16  There is moderate to severe left ventricular systolic dysfunction.  The left ventricular ejection  fraction is 25-35% by visual estimate.  LV end diastolic pressure is mildly elevated.  Mid RCA lesion, 25 %stenosed.  Ost 2nd Diag to 2nd Diag lesion, 25 %stenosed.  Mid LAD lesion, 40 %stenosed.  Mid Cx to Dist Cx lesion, 25 %stenosed.   Nonischemic cardiomyopathy.  Continue medical therapy.    ASSESSMENT & PLAN:   1. NICM - Cardiogram showed left ventricular function of 45-50% with wall motion abnormality. It shows nonobstructive CAD. - Continue beta blocker and ace.  2. Chronic combined CHF/Lower extremity edema - Worse in the evening. She tries to elevate her leg as much as she can. Not eating any excess salt. We will get a BMP and BNP. He was hyponatremic in hospital. Increase Lasix to 20 mg once a day for 3 days and then back to 10 mg daily. HF education given. No orthopnea or PND.  3. HTN - Her blood pressure was 100/64 with pulse of  94. Patient has been running in 120 to 130s range at home. Repeat blood pressure check 1:20/76.  4. Anemia - Her hemoglobin was 8.5 at the day of discharge 01/06/16. He has been feeling fatigue and worse since then. Hasn't followed up with PCP yet. We'll get CBC today and advised to make appointment with primary care provider sooner rather than later. Continue supplemental iron.  Medication Adjustments/Labs and Tests Ordered: Current medicines are reviewed at length with the patient today.  Concerns regarding medicines are outlined above.  Medication changes, Labs and Tests ordered today are listed in the Patient Instructions below. Patient Instructions  Your physician has recommended you make the following change in your medication:  INCREASE  FUROSEMIDE TO  20  MG    FOR  THREE DAYS   THEN  RESUME  TO  10 MG   Your physician recommends that you return for lab work in:  Colon  Your physician recommends that you schedule a follow-up appointment in:  2 MONTHS  WITH DR  Winn Jock, PA  01/28/2016 3:23 PM    [facility] [place], [place], [place]  [address] Phone: [phone]; Fax: [phone]

## 2016-01-28 ENCOUNTER — Ambulatory Visit (INDEPENDENT_AMBULATORY_CARE_PROVIDER_SITE_OTHER): Payer: Medicare Other | Admitting: Physician Assistant

## 2016-01-28 VITALS — BP 100/64 | HR 94 | Ht 63.0 in | Wt 139.0 lb

## 2016-01-28 DIAGNOSIS — D649 Anemia, unspecified: Secondary | ICD-10-CM

## 2016-01-28 DIAGNOSIS — I5042 Chronic combined systolic (congestive) and diastolic (congestive) heart failure: Secondary | ICD-10-CM

## 2016-01-28 DIAGNOSIS — I428 Other cardiomyopathies: Secondary | ICD-10-CM

## 2016-01-28 DIAGNOSIS — R609 Edema, unspecified: Secondary | ICD-10-CM | POA: Diagnosis not present

## 2016-01-28 DIAGNOSIS — R0602 Shortness of breath: Secondary | ICD-10-CM

## 2016-01-28 NOTE — Patient Instructions (Addendum)
Your physician has recommended you make the following change in your medication:  INCREASE  FUROSEMIDE TO  20  MG    FOR  THREE DAYS   THEN  RESUME  TO  10 MG   Your physician recommends that you return for lab work in:  [place]  Your physician recommends that you schedule a follow-up appointment in:  2 MONTHS  WITH DR  [place] Failure Heart failure means your heart has trouble pumping blood. This makes it hard for your body to work well. Heart failure is usually a long-term (chronic) condition. You must take good care of yourself and follow your doctor's treatment plan. HOME CARE  Take your heart medicine as told by your doctor.  Do not stop taking medicine unless your doctor tells you to.  Do not skip any dose of medicine.  Refill your medicines before they run out.  Take other medicines only as told by your doctor or pharmacist.  Stay active if told by your doctor. The elderly and people with severe heart failure should talk with a doctor about physical activity.  Eat heart-healthy foods. Choose foods that are without trans fat and are low in saturated fat, cholesterol, and salt (sodium). This includes fresh or frozen fruits and vegetables, fish, lean meats, fat-free or low-fat dairy foods, whole grains, and high-fiber foods. Lentils and dried peas and beans (legumes) are also good choices.  Limit salt if told by your doctor.  Cook in a healthy way. Roast, grill, broil, bake, poach, steam, or stir-fry foods.  Limit fluids as told by your doctor.  Weigh yourself every morning. Do this after you pee (urinate) and before you eat breakfast. Write down your weight to give to your doctor.  Take your blood pressure and write it down if your doctor tells you to.  Ask your doctor how to check your pulse. Check your pulse as told.  Lose weight if told by your doctor.  Stop smoking or chewing tobacco. Do not use gum or patches that help you quit without your  doctor's approval.  Schedule and go to doctor visits as told.  Nonpregnant women should have no more than 1 drink a day. Men should have no more than 2 drinks a day. Talk to your doctor about drinking alcohol.  Stop illegal drug use.  Stay current with shots (immunizations).  Manage your health conditions as told by your doctor.  Learn to manage your stress.  Rest when you are tired.  If it is really hot outside:  Avoid intense activities.  Use air conditioning or fans, or get in a cooler place.  Avoid caffeine and alcohol.  Wear loose-fitting, lightweight, and light-colored clothing.  If it is really cold outside:  Avoid intense activities.  Layer your clothing.  Wear mittens or gloves, a hat, and a scarf when going outside.  Avoid alcohol.  Learn about heart failure and get support as needed.  Get help to maintain or improve your quality of life and your ability to care for yourself as needed. GET HELP IF:   You gain weight quickly.  You are more short of breath than usual.  You cannot do your normal activities.  You tire easily.  You cough more than normal, especially with activity.  You have any or more puffiness (swelling) in areas such as your hands, feet, ankles, or belly (abdomen).  You cannot sleep because it is hard to breathe.  You  feel like your heart is beating fast (palpitations).  You get dizzy or light-headed when you stand up. GET HELP RIGHT AWAY IF:   You have trouble breathing.  There is a change in mental status, such as becoming less alert or not being able to focus.  You have chest pain or discomfort.  You faint. MAKE SURE YOU:   Understand these instructions.  Will watch your condition.  Will get help right away if you are not doing well or get worse. This information is not intended to replace advice given to you by your health care provider. Make sure you discuss any questions you have with your health care  provider. Document Released: 10/13/2007 Document Revised: 01/24/2014 Document Reviewed: 02/20/2012 Elsevier Interactive Patient Education  2017 [employer].

## 2016-01-29 ENCOUNTER — Telehealth: Payer: Self-pay | Admitting: Physician Assistant

## 2016-01-29 LAB — BASIC METABOLIC PANEL
BUN/Creatinine Ratio: 17 (ref 12–28)
BUN: 14 mg/dL (ref 8–27)
CO2: 24 mmol/L (ref 18–29)
Calcium: 8.5 mg/dL — ABNORMAL LOW (ref 8.7–10.3)
Chloride: 93 mmol/L — ABNORMAL LOW (ref 96–106)
Creatinine, Ser: 0.81 mg/dL (ref 0.57–1.00)
GFR calc Af Amer: 75 mL/min/{1.73_m2} (ref >59)
GFR calc non Af Amer: 65 mL/min/{1.73_m2} (ref >59)
GLUCOSE: 96 mg/dL (ref 65–99)
POTASSIUM: 4.1 mmol/L (ref 3.5–5.2)
SODIUM: 135 mmol/L (ref 134–144)

## 2016-01-29 LAB — CBC WITH DIFFERENTIAL/PLATELET
BASOS ABS: 0.1 10*3/uL (ref 0.0–0.2)
Basos: 1 %
EOS (ABSOLUTE): 0 10*3/uL (ref 0.0–0.4)
Eos: 0 %
Hematocrit: 28.5 % — ABNORMAL LOW (ref 34.0–46.6)
Hemoglobin: 9.3 g/dL — ABNORMAL LOW (ref 11.1–15.9)
LYMPHS ABS: 9.7 10*3/uL — AB (ref 0.7–3.1)
Lymphs: 70 %
MCH: 26.6 pg (ref 26.6–33.0)
MCHC: 32.6 g/dL (ref 31.5–35.7)
MCV: 81 fL (ref 79–97)
MONOS ABS: 0.1 10*3/uL (ref 0.1–0.9)
Monocytes: 1 %
NEUTROS PCT: 28 %
Neutrophils Absolute: 3.9 10*3/uL (ref 1.4–7.0)
PLATELETS: 149 10*3/uL — AB (ref 150–379)
RBC: 3.5 x10E6/uL — AB (ref 3.77–5.28)
RDW: 17 % — AB (ref 12.3–15.4)
WBC: 13.8 10*3/uL — ABNORMAL HIGH (ref 3.4–10.8)

## 2016-01-29 LAB — PRO B NATRIURETIC PEPTIDE: NT-PRO BNP: 8943 pg/mL — AB (ref 0–738)

## 2016-01-29 NOTE — Telephone Encounter (Signed)
New message    Patient daughter calling wants a call back today needs clarificaiton on recent phone conservation regarding her mother lab results.

## 2016-01-29 NOTE — Telephone Encounter (Signed)
Returned pts daughter, Olin Hauser, [place] signed at o/v 01/28/16, call. Discussed pts lab results.  She verbalized understanding and appreciation.

## 2016-03-30 NOTE — Progress Notes (Deleted)
Cardiology Office Note    Date:  03/30/2016   ID:  Kathleen Arias, DOB [birth year], MRN [id number]  PCP:  Woody Seller, MD  Cardiologist: Dr. Johnsie Cancel   Chief Complaint: Hospital follow up for CHF  History of Present Illness:   Kathleen Arias is a 83 y.o. female with PHM of HTN and hypothyroidism .   Seen [facility] 01/05/16 with  chest pain. EKG showed LBBB. Echo showed EF 40-45% inferior hypokinesis ? Ischemic moderate MR grade 2 diastolic dysfunction. BNP minimally elevated to 450. Given IV lasix,. Myoview high risk with TID and infarct/ischemia in multiple regions. Even new onset CHF, LBBB and abnormal Myoview in May for cardiac catheterization for definite evaluation of coronary anatomy. Cath showed mild nonobstructive CAD.?  Takatsubo DCM. Discharged on BB and ACE.   Has had anemia on iron last Hct in Epic 01/28/16 28.5   Past Medical History:  Diagnosis Date  . Arthritis   . Hypertension   . Thyroid disease     Past Surgical History:  Procedure Laterality Date  . CARDIAC CATHETERIZATION N/A 01/07/2016   Procedure: Left Heart Cath and Coronary Angiography;  Surgeon: Jettie Booze, MD;  Location: [facility];  Service: Cardiovascular;  Laterality: N/A;  . CATARACT EXTRACTION, BILATERAL    . HIP SURGERY      Current Medications: Prior to Admission medications   Medication Sig Start Date End Date Taking? Authorizing Provider  aspirin EC 325 MG tablet Take 325 mg by mouth every other day.    Historical Provider, MD  B COMPLEX-C-CALCIUM PO Take 1 tablet by mouth daily.    Historical Provider, MD  carvedilol (COREG) 6.25 MG tablet Take 1 tablet (6.25 mg total) by mouth 2 (two) times daily with a meal. 01/08/16   Hosie Poisson, MD  ferrous sulfate 325 (65 FE) MG tablet Take 1 tablet (325 mg total) by mouth daily with breakfast. 01/08/16   Hosie Poisson, MD  furosemide (LASIX) 20 MG tablet Take 0.5 tablets (10 mg total) by mouth daily. 01/09/16   Hosie Poisson, MD    levothyroxine (SYNTHROID, LEVOTHROID) 112 MCG tablet Take 112 mcg by mouth See admin instructions. Sun/Tues/Thurs/Sat    Historical Provider, MD  levothyroxine (SYNTHROID, LEVOTHROID) 125 MCG tablet Take 125 mcg by mouth every Monday, Wednesday, and Friday.  08/21/15   Historical Provider, MD  losartan (COZAAR) 50 MG tablet Take 1 tablet (50 mg total) by mouth daily. 01/09/16   Hosie Poisson, MD  POTASSIUM PO Take 99 mg by mouth 2 (two) times a week.    Historical Provider, MD  vitamin E 1000 UNIT capsule Take 1,000 Units by mouth every other day.    Historical Provider, MD    Allergies:   Patient has no known allergies.   Social History   Social History  . Marital status: Single    Spouse name: N/A  . Number of children: N/A  . Years of education: N/A   Social History Main Topics  . Smoking status: Former Research scientist (life sciences)  . Smokeless tobacco: Never Used  . Alcohol use No  . Drug use: No  . Sexual activity: Not on file   Other Topics Concern  . Not on file   Social History Narrative  . No narrative on file     Family History:  The patient's family history includes Heart disease in her mother.   ROS:   Please see the history of present illness.    ROS All other systems reviewed and are  negative.   PHYSICAL EXAM:   VS:  There were no vitals taken for this visit.   GEN: Well nourished, well developed, in no acute distress  HEENT: normal  Neck: no JVD, carotid bruits, or masses Cardiac: RRR; no murmurs, rubs, or gallops, trace bilateral leg edema  And 1+ bilateral feed edema Respiratory:  clear to auscultation bilaterally, normal work of breathing GI: soft, nontender, nondistended, + BS MS: no deformity or atrophy  Skin: warm and dry, no rash Neuro:  Alert and Oriented x 3, Strength and sensation are intact Psych: euthymic mood, full affect  Wt Readings from Last 3 Encounters:  01/28/16 139 lb (63 kg)  01/08/16 137 lb 3.2 oz (62.2 kg)      Studies/Labs Reviewed:   EKG:   01/07/16 SR LBBB   Recent Labs: 01/05/2016: B Natriuretic Peptide 450.1; TSH 3.156 01/07/2016: Hemoglobin 9.3 01/28/2016: BUN 14; Creatinine, Ser 0.81; NT-Pro BNP 8,943; Platelets 149; Potassium 4.1; Sodium 135   Lipid Panel No results found for: CHOL, TRIG, HDL, CHOLHDL, VLDL, LDLCALC, LDLDIRECT  Additional studies/ records that were reviewed today include:   Echocardiogram: 01/05/16 personally reviewed  LV EF: 40% -   45%  ------------------------------------------------------------------- Indications:      CHF - 428.0.  ------------------------------------------------------------------- History:   Risk factors:  Hypertension.  ------------------------------------------------------------------- Study Conclusions  - Procedure narrative: Transthoracic echocardiography. Image   quality was fair. The study was technically difficult, as a   result of restricted patient mobility. Intravenous contrast   (Definity) was administered. - Left ventricle: The cavity size was normal. Wall thickness was   increased in a pattern of mild LVH. Systolic function was mildly   to moderately reduced. The estimated ejection fraction was in the   range of 40% to 45%. No apical thrombus noted. Severe inferior   hypokinesis to akinesis. Incoordinate septal motion. Doppler   parameters are consistent with psuedonormal left ventricular   relaxation (grade 2 diastolic dysfunction). The E/e&' ratio is   >15, suggesting elevated LV filling pressure. - Aortic valve: Trileaflet. Sclerosis without stenosis. There was   trivial regurgitation. - Mitral valve: Mildly thickened leaflets . There is ischemic   tethering of the posterior leaflet. There was moderate   regurgitation. - Left atrium: Moderately dilated. - Right atrium: The atrium was mildly dilated. - Tricuspid valve: There was mild regurgitation. - Pulmonary arteries: PA peak pressure: 53 mm Hg (S). - Inferior vena cava: The vessel was normal  in size. The   respirophasic diameter changes were in the normal range (>= 50%),   consistent with normal central venous pressure.  Impressions:  - Technically difficult study. Definity contrast given. LVEF   40-45%, mild LVH, severe inferior hypokinesis to akinesis, Grade   2 DD with elevated LV filling presure, trivial AI, moderate   ischemic MR with posterior leaflet tethering and a posteriorly   directed jet, moderate LAE, mild TR, RVSP 53 mmHg, normal IVC.   Cardiac Catheterization: 01/07/16 personally reviewed films   There is moderate to severe left ventricular systolic dysfunction.  The left ventricular ejection fraction is 25-35% by visual estimate.  LV end diastolic pressure is mildly elevated.  Mid RCA lesion, 25 %stenosed.  Ost 2nd Diag to 2nd Diag lesion, 25 %stenosed.  Mid LAD lesion, 40 %stenosed.  Mid Cx to Dist Cx lesion, 25 %stenosed.   Nonischemic cardiomyopathy.  Continue medical therapy.    ASSESSMENT & PLAN:   1. NICM - Cardiogram showed left ventricular function of 45-50% with wall  motion abnormality. It shows nonobstructive CAD. - Continue beta blocker and ace.  2. Chronic combined CHF/Lower extremity edema - Worse in the evening. She tries to elevate her leg as much as she can. Not eating any excess salt. Continue current lasix dose   3. HTN- Well controlled.  Continue current medications and low sodium Dash type diet.    4. Anemia f/u primary on iron  Lab Results  Component Value Date   HCT 28.5 (L) 01/28/2016     Medication Adjustments/Labs and Tests Ordered: Current medicines are reviewed at length with the patient today.  Concerns regarding medicines are outlined above.  Medication changes, Labs and Tests ordered today are listed in the Patient Instructions below. There are no Patient Instructions on file for this visit.   Signed, Jenkins Rouge, MD  03/30/2016 4:59 PM    [facility] [address],  [place], [place]  [address] Phone: [phone]; Fax: [phone]

## 2016-04-06 ENCOUNTER — Ambulatory Visit: Payer: Medicare Other | Admitting: Cardiovascular Disease

## 2016-04-15 ENCOUNTER — Ambulatory Visit: Payer: Medicare Other | Admitting: Cardiovascular Disease

## 2016-04-18 ENCOUNTER — Telehealth: Payer: Self-pay | Admitting: Hematology

## 2016-04-18 ENCOUNTER — Encounter: Payer: Self-pay | Admitting: Hematology

## 2016-04-18 NOTE — Telephone Encounter (Signed)
Received a call from [place] at [facility] to schedule the pt an appt. Appt has been scheduled for the pt to see Dr. [name] on 4/30 at 11am. She will notify the pt. Letter mailed.

## 2016-04-29 NOTE — Progress Notes (Signed)
Cardiology Office Note    Date:  05/04/2016   ID:  Kathleen Arias, DOB May 22, 1925, MRN [id number]  PCP:  Woody Seller, MD  Cardiologist: Dr. Johnsie Cancel   Chief Complaint: Hospital follow up for CHF  History of Present Illness:   Kathleen Arias is a 83 y.o. female with PHM of HTN and hypothyroidism .   Seen [facility] 01/05/16 with  chest pain. EKG showed LBBB. Echo showed EF 40-45% inferior hypokinesis ? Ischemic moderate MR grade 2 diastolic dysfunction. BNP minimally elevated to 450. Given IV lasix,. Myoview high risk with TID and infarct/ischemia in multiple regions. Even new onset CHF, LBBB and abnormal Myoview in May for cardiac catheterization for definite evaluation of coronary anatomy. Cath showed mild nonobstructive CAD.?  Takatsubo DCM. Discharged on BB and ACE.   Has had anemia on iron last Hct in Epic 01/28/16 28.5  Tired, fatigued Iron pills hurt her stomach not taking ARB and didn't like lasix Changed to HCTZ on her own   Past Medical History:  Diagnosis Date  . Arthritis   . Hypertension   . Thyroid disease     Past Surgical History:  Procedure Laterality Date  . CARDIAC CATHETERIZATION N/A 01/07/2016   Procedure: Left Heart Cath and Coronary Angiography;  Surgeon: Jettie Booze, MD;  Location: [facility];  Service: Cardiovascular;  Laterality: N/A;  . CATARACT EXTRACTION, BILATERAL    . HIP SURGERY      Current Medications: Prior to Admission medications   Medication Sig Start Date End Date Taking? Authorizing Provider  aspirin EC 325 MG tablet Take 325 mg by mouth every other day.    Historical Provider, MD  B COMPLEX-C-CALCIUM PO Take 1 tablet by mouth daily.    Historical Provider, MD  carvedilol (COREG) 6.25 MG tablet Take 1 tablet (6.25 mg total) by mouth 2 (two) times daily with a meal. 01/08/16   Hosie Poisson, MD  ferrous sulfate 325 (65 FE) MG tablet Take 1 tablet (325 mg total) by mouth daily with breakfast. 01/08/16   Hosie Poisson, MD    furosemide (LASIX) 20 MG tablet Take 0.5 tablets (10 mg total) by mouth daily. 01/09/16   Hosie Poisson, MD  levothyroxine (SYNTHROID, LEVOTHROID) 112 MCG tablet Take 112 mcg by mouth See admin instructions. Sun/Tues/Thurs/Sat    Historical Provider, MD  levothyroxine (SYNTHROID, LEVOTHROID) 125 MCG tablet Take 125 mcg by mouth every Monday, Wednesday, and Friday.  08/21/15   Historical Provider, MD  losartan (COZAAR) 50 MG tablet Take 1 tablet (50 mg total) by mouth daily. 01/09/16   Hosie Poisson, MD  POTASSIUM PO Take 99 mg by mouth 2 (two) times a week.    Historical Provider, MD  vitamin E 1000 UNIT capsule Take 1,000 Units by mouth every other day.    Historical Provider, MD    Allergies:   Patient has no known allergies.   Social History   Social History  . Marital status: Single    Spouse name: N/A  . Number of children: N/A  . Years of education: N/A   Social History Main Topics  . Smoking status: Former Research scientist (life sciences)  . Smokeless tobacco: Never Used  . Alcohol use No  . Drug use: No  . Sexual activity: Not on file   Other Topics Concern  . Not on file   Social History Narrative  . No narrative on file     Family History:  The patient's family history includes Heart disease in her mother.  ROS:   Please see the history of present illness.    ROS All other systems reviewed and are negative.   PHYSICAL EXAM:   VS:  BP 130/76   Pulse 87   Ht 5\' 3"  (1.6 m)   Wt 127 lb 9.6 oz (57.9 kg)   SpO2 96%   BMI 22.60 kg/m    GEN: Well nourished, well developed, in no acute distress  HEENT: normal  Neck: no JVD, carotid bruits, or masses Cardiac: RRR; no murmurs, rubs, or gallops, trace bilateral leg edema  And 1+ bilateral feed edema Respiratory:  clear to auscultation bilaterally, normal work of breathing GI: soft, nontender, nondistended, + BS MS: no deformity or atrophy  Skin: warm and dry, no rash Neuro:  Alert and Oriented x 3, Strength and sensation are intact Psych:  euthymic mood, full affect  Wt Readings from Last 3 Encounters:  05/04/16 127 lb 9.6 oz (57.9 kg)  01/28/16 139 lb (63 kg)  01/08/16 137 lb 3.2 oz (62.2 kg)      Studies/Labs Reviewed:   EKG:  01/07/16 SR LBBB   Recent Labs: 01/05/2016: B Natriuretic Peptide 450.1; TSH 3.156 01/07/2016: Hemoglobin 9.3 01/28/2016: BUN 14; Creatinine, Ser 0.81; NT-Pro BNP 8,943; Platelets 149; Potassium 4.1; Sodium 135   Lipid Panel No results found for: CHOL, TRIG, HDL, CHOLHDL, VLDL, LDLCALC, LDLDIRECT  Additional studies/ records that were reviewed today include:   Echocardiogram: 01/05/16 personally reviewed  LV EF: 40% -   45%  ------------------------------------------------------------------- Indications:      CHF - 428.0.  ------------------------------------------------------------------- History:   Risk factors:  Hypertension.  ------------------------------------------------------------------- Study Conclusions  - Procedure narrative: Transthoracic echocardiography. Image   quality was fair. The study was technically difficult, as a   result of restricted patient mobility. Intravenous contrast   (Definity) was administered. - Left ventricle: The cavity size was normal. Wall thickness was   increased in a pattern of mild LVH. Systolic function was mildly   to moderately reduced. The estimated ejection fraction was in the   range of 40% to 45%. No apical thrombus noted. Severe inferior   hypokinesis to akinesis. Incoordinate septal motion. Doppler   parameters are consistent with psuedonormal left ventricular   relaxation (grade 2 diastolic dysfunction). The E/e&' ratio is   >15, suggesting elevated LV filling pressure. - Aortic valve: Trileaflet. Sclerosis without stenosis. There was   trivial regurgitation. - Mitral valve: Mildly thickened leaflets . There is ischemic   tethering of the posterior leaflet. There was moderate   regurgitation. - Left atrium: Moderately  dilated. - Right atrium: The atrium was mildly dilated. - Tricuspid valve: There was mild regurgitation. - Pulmonary arteries: PA peak pressure: 53 mm Hg (S). - Inferior vena cava: The vessel was normal in size. The   respirophasic diameter changes were in the normal range (>= 50%),   consistent with normal central venous pressure.  Impressions:  - Technically difficult study. Definity contrast given. LVEF   40-45%, mild LVH, severe inferior hypokinesis to akinesis, Grade   2 DD with elevated LV filling presure, trivial AI, moderate   ischemic MR with posterior leaflet tethering and a posteriorly   directed jet, moderate LAE, mild TR, RVSP 53 mmHg, normal IVC.   Cardiac Catheterization: 01/07/16 personally reviewed films   There is moderate to severe left ventricular systolic dysfunction.  The left ventricular ejection fraction is 25-35% by visual estimate.  LV end diastolic pressure is mildly elevated.  Mid RCA lesion, 25 %stenosed.  Ost 2nd Diag to 2nd Diag lesion, 25 %stenosed.  Mid LAD lesion, 40 %stenosed.  Mid Cx to Dist Cx lesion, 25 %stenosed.   Nonischemic cardiomyopathy.  Continue medical therapy.    ASSESSMENT & PLAN:   1. NICM - Cardiogram showed left ventricular function of 45-50% with wall motion abnormality. Cath 01/07/16  nonobstructive CAD. - Continue beta blocker and ace. Possible stress induece DCM improving  She will cut losartan Back to 25 mg as she feels wiped out on 50 mg   2. Chronic combined CHF/Lower extremity edema - Worse in the evening. She tries to elevate her leg as much as she can. Not eating any excess salt. Continue current HCTZ 12.5 mg did not like lasix   3. HTN- Well controlled.  Continue current medications and low sodium Dash type diet.    4. Anemia f/u primary on iron but hurts her stomach. Labs today  Lab Results  Component Value Date   HCT 28.5 (L) 01/28/2016     Medication Adjustments/Labs and Tests  Ordered: Current medicines are reviewed at length with the patient today.  Concerns regarding medicines are outlined above.  Medication changes, Labs and Tests ordered today are listed in the Patient Instructions below. Patient Instructions  Medication Instructions:  Your physician recommends that you continue on your current medications as directed. Please refer to the Current Medication list given to you today.  Labwork: Your physician recommends that you have lab work today - BMET, CBC, BNP  Testing/Procedures: Your physician has requested that you have an echocardiogram in 6 months. Echocardiography is a painless test that uses sound waves to create images of your heart. It provides your doctor with information about the size and shape of your heart and how well your heart's chambers and valves are working. This procedure takes approximately one hour. There are no restrictions for this procedure.  Follow-Up: Your physician wants you to follow-up in: 6 months with Dr. Johnsie Cancel. You will receive a reminder letter in the mail two months in advance. If you don't receive a letter, please call our office to schedule the follow-up appointment.   If you need a refill on your cardiac medications before your next appointment, please call your pharmacy.       Signed, Jenkins Rouge, MD  05/04/2016 2:56 PM    [facility] [address], [place], [place]  [address] Phone: [phone]; Fax: [phone]

## 2016-05-04 ENCOUNTER — Ambulatory Visit (INDEPENDENT_AMBULATORY_CARE_PROVIDER_SITE_OTHER): Payer: Medicare Other | Admitting: Cardiovascular Disease

## 2016-05-04 VITALS — BP 130/76 | HR 87 | Ht 63.0 in | Wt 127.6 lb

## 2016-05-04 DIAGNOSIS — I5042 Chronic combined systolic (congestive) and diastolic (congestive) heart failure: Secondary | ICD-10-CM | POA: Diagnosis not present

## 2016-05-04 DIAGNOSIS — D649 Anemia, unspecified: Secondary | ICD-10-CM | POA: Diagnosis not present

## 2016-05-04 MED ORDER — LOSARTAN POTASSIUM 25 MG PO TABS: 25.0000 mg | ORAL_TABLET | Freq: Every day | ORAL | 3 refills | Status: DC

## 2016-05-04 NOTE — Patient Instructions (Addendum)
Medication Instructions:  Your physician recommends that you continue on your current medications as directed. Please refer to the Current Medication list given to you today.  Labwork: Your physician recommends that you have lab work today - BMET, CBC, BNP  Testing/Procedures: Your physician has requested that you have an echocardiogram in 6 months. Echocardiography is a painless test that uses sound waves to create images of your heart. It provides your doctor with information about the size and shape of your heart and how well your heart's chambers and valves are working. This procedure takes approximately one hour. There are no restrictions for this procedure.  Follow-Up: Your physician wants you to follow-up in: 6 months with Dr. Johnsie Cancel. You will receive a reminder letter in the mail two months in advance. If you don't receive a letter, please call our office to schedule the follow-up appointment.   If you need a refill on your cardiac medications before your next appointment, please call your pharmacy.

## 2016-05-05 LAB — BASIC METABOLIC PANEL
BUN / CREAT RATIO: 16 (ref 12–28)
BUN: 15 mg/dL (ref 8–27)
CO2: 27 mmol/L (ref 18–29)
Calcium: 8.8 mg/dL (ref 8.7–10.3)
Chloride: 91 mmol/L — ABNORMAL LOW (ref 96–106)
Creatinine, Ser: 0.92 mg/dL (ref 0.57–1.00)
GFR calc Af Amer: 64 mL/min/{1.73_m2} (ref >59)
GFR calc non Af Amer: 56 mL/min/{1.73_m2} — ABNORMAL LOW (ref >59)
Glucose: 90 mg/dL (ref 65–99)
POTASSIUM: 4.7 mmol/L (ref 3.5–5.2)
Sodium: 130 mmol/L — ABNORMAL LOW (ref 134–144)

## 2016-05-05 LAB — CBC WITH DIFFERENTIAL/PLATELET
BASOS: 0 %
Basophils Absolute: 0 10*3/uL (ref 0.0–0.2)
EOS (ABSOLUTE): 0.1 10*3/uL (ref 0.0–0.4)
EOS: 1 %
Hematocrit: 27.9 % — ABNORMAL LOW (ref 34.0–46.6)
Hemoglobin: 9.6 g/dL — ABNORMAL LOW (ref 11.1–15.9)
LYMPHS ABS: 3.1 10*3/uL (ref 0.7–3.1)
Lymphs: 23 %
MCH: 27 pg (ref 26.6–33.0)
MCHC: 34.4 g/dL (ref 31.5–35.7)
MCV: 79 fL (ref 79–97)
MONOS ABS: 1.2 10*3/uL — AB (ref 0.1–0.9)
Monocytes: 9 %
Neutrophils Absolute: 9.1 10*3/uL — ABNORMAL HIGH (ref 1.4–7.0)
Neutrophils: 67 %
Platelets: 118 10*3/uL — ABNORMAL LOW (ref 150–379)
RBC: 3.55 x10E6/uL — ABNORMAL LOW (ref 3.77–5.28)
RDW: 15.6 % — AB (ref 12.3–15.4)
WBC: 13.6 10*3/uL — AB (ref 3.4–10.8)

## 2016-05-05 LAB — PRO B NATRIURETIC PEPTIDE: NT-PRO BNP: 7005 pg/mL — AB (ref 0–738)

## 2016-05-16 ENCOUNTER — Telehealth: Payer: Self-pay | Admitting: Hematology

## 2016-05-16 ENCOUNTER — Other Ambulatory Visit (HOSPITAL_COMMUNITY)
Admission: RE | Admit: 2016-05-16 | Discharge: 2016-05-16 | Disposition: A | Discharge status: Home / Self Care | Payer: Medicare Other | Source: Ambulatory Visit | Attending: Hematology | Admitting: Hematology

## 2016-05-16 ENCOUNTER — Encounter: Payer: Self-pay | Admitting: Hematology

## 2016-05-16 ENCOUNTER — Ambulatory Visit (HOSPITAL_BASED_OUTPATIENT_CLINIC_OR_DEPARTMENT_OTHER): Payer: Medicare Other | Admitting: Hematology

## 2016-05-16 ENCOUNTER — Ambulatory Visit (HOSPITAL_BASED_OUTPATIENT_CLINIC_OR_DEPARTMENT_OTHER): Payer: Medicare Other

## 2016-05-16 DIAGNOSIS — D649 Anemia, unspecified: Secondary | ICD-10-CM

## 2016-05-16 DIAGNOSIS — D696 Thrombocytopenia, unspecified: Secondary | ICD-10-CM | POA: Diagnosis not present

## 2016-05-16 DIAGNOSIS — D509 Iron deficiency anemia, unspecified: Secondary | ICD-10-CM | POA: Insufficient documentation

## 2016-05-16 DIAGNOSIS — Z7189 Other specified counseling: Secondary | ICD-10-CM

## 2016-05-16 DIAGNOSIS — D72820 Lymphocytosis (symptomatic): Secondary | ICD-10-CM | POA: Insufficient documentation

## 2016-05-16 DIAGNOSIS — R897 Abnormal histological findings in specimens from other organs, systems and tissues: Secondary | ICD-10-CM | POA: Diagnosis not present

## 2016-05-16 DIAGNOSIS — E039 Hypothyroidism, unspecified: Secondary | ICD-10-CM

## 2016-05-16 DIAGNOSIS — D508 Other iron deficiency anemias: Secondary | ICD-10-CM

## 2016-05-16 LAB — COMPREHENSIVE METABOLIC PANEL
ALT: 8 U/L (ref 0–55)
ANION GAP: 9 meq/L (ref 3–11)
AST: 39 U/L — ABNORMAL HIGH (ref 5–34)
Albumin: 3.2 g/dL — ABNORMAL LOW (ref 3.5–5.0)
Alkaline Phosphatase: 103 U/L (ref 40–150)
BILIRUBIN TOTAL: 0.84 mg/dL (ref 0.20–1.20)
BUN: 13.3 mg/dL (ref 7.0–26.0)
CO2: 27 meq/L (ref 22–29)
Calcium: 8.7 mg/dL (ref 8.4–10.4)
Chloride: 94 mEq/L — ABNORMAL LOW (ref 98–109)
Creatinine: 0.8 mg/dL (ref 0.6–1.1)
EGFR: 71 mL/min/{1.73_m2} — AB (ref >90)
Glucose: 87 mg/dl (ref 70–140)
Potassium: 4.1 mEq/L (ref 3.5–5.1)
Sodium: 130 mEq/L — ABNORMAL LOW (ref 136–145)
TOTAL PROTEIN: 7.3 g/dL (ref 6.4–8.3)

## 2016-05-16 LAB — CBC & DIFF AND RETIC
BASO%: 1.4 % (ref 0.0–2.0)
BASOS ABS: 0.2 10*3/uL — AB (ref 0.0–0.1)
EOS ABS: 0 10*3/uL (ref 0.0–0.5)
EOS%: 0 % (ref 0.0–7.0)
HCT: 28.7 % — ABNORMAL LOW (ref 34.8–46.6)
HGB: 9.6 g/dL — ABNORMAL LOW (ref 11.6–15.9)
IMMATURE RETIC FRACT: 8.8 % (ref 1.60–10.00)
LYMPH#: 11 10*3/uL — AB (ref 0.9–3.3)
LYMPH%: 75 % — ABNORMAL HIGH (ref 14.0–49.7)
MCH: 26.5 pg (ref 25.1–34.0)
MCHC: 33.5 g/dL (ref 31.5–36.0)
MCV: 79 fL — AB (ref 79.5–101.0)
MONO#: 0.6 10*3/uL (ref 0.1–0.9)
MONO%: 4.2 % (ref 0.0–14.0)
NEUT#: 2.9 10*3/uL (ref 1.5–6.5)
NEUT%: 19.4 % — AB (ref 38.4–76.8)
PLATELETS: 131 10*3/uL — AB (ref 145–400)
RBC: 3.63 10*6/uL — AB (ref 3.70–5.45)
RDW: 16.8 % — AB (ref 11.2–14.5)
Retic %: 2.89 % — ABNORMAL HIGH (ref 0.70–2.10)
Retic Ct Abs: 104.91 10*3/uL — ABNORMAL HIGH (ref 33.70–90.70)
WBC: 14.7 10*3/uL — ABNORMAL HIGH (ref 3.9–10.3)

## 2016-05-16 LAB — IRON AND TIBC
%SAT: 9 % — ABNORMAL LOW (ref 21–57)
Iron: 42 ug/dL (ref 41–142)
TIBC: 451 ug/dL — ABNORMAL HIGH (ref 236–444)
UIBC: 410 ug/dL — ABNORMAL HIGH (ref 120–384)

## 2016-05-16 LAB — CHCC SMEAR

## 2016-05-16 LAB — LACTATE DEHYDROGENASE: LDH: 573 U/L — ABNORMAL HIGH (ref 125–245)

## 2016-05-16 LAB — TECHNOLOGIST REVIEW

## 2016-05-16 LAB — FERRITIN: Ferritin: 107 ng/ml (ref 9–269)

## 2016-05-16 NOTE — Progress Notes (Signed)
Marland Kitchen    HEMATOLOGY/ONCOLOGY CONSULTATION NOTE  Date of Service: 05/16/2016  Patient Care Team: [name], MD as PCP - General (Family Medicine)  CHIEF COMPLAINTS/PURPOSE OF CONSULTATION:  Anemia Lymphocytosis Thrombocytopenia  HISTORY OF PRESENTING ILLNESS:   Kathleen Arias is a wonderful 83 y.o. female who has been referred to [place] by Dr .Redmond Pulling, Jama Flavors, MD for evaluation and management of anemia, leukocytosis and thrombocytopenia.  Patient is a very pleasant female who is here with her daughter. She has a history of hypertension, chronic systolic CHF [ejection fraction of 25-35% based on recent cardiac catheterization on 01/13/2016, echo done showed ejection fraction of 40-45%, nonischemic cardiomyopathy], iron deficiency,  Patient presented to his primary care physician with some dyspnea on exertion which is primarily related to her systolic CHF but she was also noted to have recent blood counts with her primary care physician on 03/15/2016 which showed anemia with a hemoglobin of 9.7 and a hematocrit of 79, platelet count of 137k , WBC count of 11.7k with 7.6k lymphocytes.  Iron studies were done which showed a ferritin of 84 and an iron saturation of 9%. She has been on oral iron replacement by her primary care physician and notes significant constipation and GI intolerance. She reports that she had a FOBT in 12/2015  which was negative.   Patient notes significant fatigue and dyspnea on exertion. Notes weight loss of about 15 pounds since September 2017. Weight is down from 142 to 124lbs.   Patient denies any enlarged lymph nodes, no night sweats.  MEDICAL HISTORY:  Past Medical History:  Diagnosis Date  . Arthritis   . Hypertension   . Thyroid disease   Chronic systolic CHF Iron deficiency   SURGICAL HISTORY: Past Surgical History:  Procedure Laterality Date  . CARDIAC CATHETERIZATION N/A 01/07/2016   Procedure: Left Heart Cath and Coronary Angiography;  Surgeon:  Jettie Booze, MD;  Location: [facility];  Service: Cardiovascular;  Laterality: N/A;  . CATARACT EXTRACTION, BILATERAL    . HIP SURGERY      SOCIAL HISTORY: Social History   Social History  . Marital status: Single    Spouse name: N/A  . Number of children: N/A  . Years of education: N/A   Occupational History  . Not on file.   Social History Main Topics  . Smoking status: Former Research scientist (life sciences)  . Smokeless tobacco: Never Used  . Alcohol use No  . Drug use: No  . Sexual activity: Not on file   Other Topics Concern  . Not on file   Social History Narrative  . No narrative on file    FAMILY HISTORY: Family History  Problem Relation Age of Onset  . Heart disease Mother     ALLERGIES:  has No Known Allergies.  MEDICATIONS:  Current Outpatient Prescriptions  Medication Sig Dispense Refill  . aspirin EC 325 MG tablet Take 325 mg by mouth every other day.    . B COMPLEX-C-CALCIUM PO Take 1 tablet by mouth daily.    . carvedilol (COREG) 6.25 MG tablet Take 6.25 mg by mouth 2 (two) times daily with a meal.     . hydrochlorothiazide (MICROZIDE) 12.5 MG capsule Take 12.5 mg by mouth daily.    Marland Kitchen levothyroxine (SYNTHROID, LEVOTHROID) 112 MCG tablet Take 112 mcg by mouth See admin instructions. Sun/Tues/Thurs/Sat    . levothyroxine (SYNTHROID, LEVOTHROID) 125 MCG tablet Take 125 mcg by mouth every Monday, Wednesday, and Friday.     . losartan (  COZAAR) 25 MG tablet Take 12.5 mg by mouth daily.    Marland Kitchen POTASSIUM PO Take 99 mg by mouth 2 (two) times a week.    . vitamin E 1000 UNIT capsule Take 1,000 Units by mouth every other day.     No current facility-administered medications for this visit.     REVIEW OF SYSTEMS:    10 Point review of Systems was done is negative except as noted above.  PHYSICAL EXAMINATION: ECOG PERFORMANCE STATUS: 2 - Symptomatic, <50% confined to bed  Vital Signs reviewed in EPIC. GENERAL:alert, in no acute distress and comfortable SKIN: no  acute rashes, no significant lesions EYES: conjunctiva are pink and non-injected, sclera anicteric OROPHARYNX: MMM, no exudates, no oropharyngeal erythema or ulceration NECK: supple, Mild JVD LYMPH:  no palpable lymphadenopathy in the cervical, axillary or inguinal regions LUNGS: clear to auscultation b/l with normal respiratory effort HEART: regular rate & rhythm ABDOMEN:  normoactive bowel sounds , non tender, not distended. no allergic palpable hepatosplenomegaly  Extremity: 1+  pedal edema Bilaterally  PSYCH: alert & oriented x 3 with fluent speech NEURO: no focal motor/sensory deficits  LABORATORY DATA:  I have reviewed the data as listed  . CBC Latest Ref Rng & Units 05/16/2016 05/04/2016 01/28/2016  WBC 3.9 - 10.3 10e3/uL 14.7(H) 13.6(H) 13.8(H)  Hemoglobin 11.6 - 15.9 g/dL 9.6(L) - -  Hematocrit 34.8 - 46.6 % 28.7(L) 27.9(L) 28.5(L)  Platelets 145 - 400 10e3/uL 131(L) 118(L) 149(L)   . CBC    Component Value Date/Time   WBC 14.7 (H) 05/16/2016 1239   WBC 8.4 01/07/2016 0456   RBC 3.63 (L) 05/16/2016 1239   RBC 3.64 (L) 01/07/2016 1020   RBC 3.63 (L) 01/07/2016 0456   HGB 9.6 (L) 05/16/2016 1239   HCT 28.7 (L) 05/16/2016 1239   PLT 131 (L) 05/16/2016 1239   PLT 118 (L) 05/04/2016 1435   MCV 79.0 (L) 05/16/2016 1239   MCH 26.5 05/16/2016 1239   MCH 25.6 (L) 01/07/2016 0456   MCHC 33.5 05/16/2016 1239   MCHC 32.7 01/07/2016 0456   RDW 16.8 (H) 05/16/2016 1239   LYMPHSABS 11.0 (H) 05/16/2016 1239   MONOABS 0.6 05/16/2016 1239   EOSABS 0.0 05/16/2016 1239   EOSABS 0.1 05/04/2016 1435   BASOSABS 0.2 (H) 05/16/2016 1239    . CMP Latest Ref Rng & Units 05/16/2016 05/04/2016 01/28/2016  Glucose 70 - 140 mg/dl 87 90 96  BUN 7.0 - 26.0 mg/dL 13.'3 15 14  ' Creatinine 0.6 - 1.1 mg/dL 0.8 0.92 0.81  Sodium 136 - 145 mEq/L 130(L) 130(L) 135  Potassium 3.5 - 5.1 mEq/L 4.1 4.7 4.1  Chloride 96 - 106 mmol/L - 91(L) 93(L)  CO2 22 - 29 mEq/L '27 27 24  ' Calcium 8.4 - 10.4 mg/dL 8.7  8.8 8.5(L)  Total Protein 6.4 - 8.3 g/dL 7.3 - -  Total Bilirubin 0.20 - 1.20 mg/dL 0.84 - -  Alkaline Phos 40 - 150 U/L 103 - -  AST 5 - 34 U/L 39(H) - -  ALT 0 - 55 U/L 8 - -    . Lab Results  Component Value Date   IRON 42 05/16/2016   TIBC 451 (H) 05/16/2016   IRONPCTSAT 9 (L) 05/16/2016   (Iron and TIBC)  Lab Results  Component Value Date   FERRITIN 107 05/16/2016    Lab Results  Component Value Date   LDH 573 (H) 05/16/2016   Component     Latest Ref Rng & Units  05/16/2016  Sed Rate     0 - 40 mm/hr 22  Erythropoietin     2.6 - 18.5 mIU/mL 27.2 (H)  LDH     125 - 245 U/L 573 (H)  Haptoglobin     34 - 200 mg/dL 53        RADIOGRAPHIC STUDIES: I have personally reviewed the radiological images as listed and agreed with the findings in the report. No results found.  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  LDH is significantly elevated at 573 which with a normal haptoglobin does not suggest active hemolysis but most consistent with a lymphoproliferative process.  2) intolerance to oral iron  3) increasing peripheral blood lymphocytosis with flow cytometry done today showing clonal B-cell population CD20 positive CD5 and CD10 negative with dim kappa light chain restriction. Serum kappa lambda free light chain ratio abnormally elevated. Myeloma panel pending   overall picture would be concerning for a lymphoproliferative process especially lymphoplasmacytic lymphoma. We'll also need to rule out other plasma cell dyscrasias especially in  the setting of recently diagnosed nonischemic cardiomyopathy.  4) hypothyroidism 5) nonischemic systolic cardiomyopathy - ejection fraction 40-45% on echo. Cardiac catheterization December 2017- nonobstructive coronary artery disease.  PLAN -Patient was set up for IV Injectafer -- given lab results we shall do only one dose of 750 mg -I called and discussed the lab results including the clonal lymphocytosis on the peripheral blood, elevated LDH levels and abnormal serum free light chain results with her daughter [name]. There is concern for a plasma cell dyscrasia vs a lymphoproliferative disorder. -I discussed goals of care over the phone to determine if the patient would want to pursue further workup for this given her age. -We discussed that the next elements of evaluation would involve getting a PET/CT and a bone marrow biopsy. -We shall follow up on her pending myeloma panel. -If the patient chooses to proceed with a PET/CT and bone marrow biopsy we shall set those up and see her about a week after getting those.  -If she chooses to forego definitive workup we shall see her back in about 4-6 weeks with repeat labs and repeat flow cytometry studies. -Daughter had several questions which I answered in details. She will call us back with the decision regarding PET/CT and bone marrow biopsy.  Return to care with Dr. [name] in 4-6 weeks with repeat labs We will get a PET CT scan and bone marrow biopsy if the patient decides to do this.  All of the patients questions were answered with apparent satisfaction. The patient knows to call the clinic with any problems, questions or concerns.  I spent 60 minutes counseling the patient face to face. The total time spent in the appointment was 80 minutes and more than 50% was on counseling and direct patient cares.    Sullivan Lone MD [place] AAHIVMS [facility] Hematology/Oncology Physician [facility]  (Office):       [phone] (Work  cell):  [phone] (Fax):  [phone]  05/16/2016 11:16 AM

## 2016-05-16 NOTE — Telephone Encounter (Signed)
Gave patient AVS and calender per 4/30 los - per Rn Diane treatment time for 5/11 is okay.

## 2016-05-17 LAB — ERYTHROPOIETIN: ERYTHROPOIETIN: 27.2 m[IU]/mL — AB (ref 2.6–18.5)

## 2016-05-17 LAB — HAPTOGLOBIN: Haptoglobin: 53 mg/dL (ref 34–200)

## 2016-05-17 LAB — SEDIMENTATION RATE: Sedimentation Rate-Westergren: 22 mm/hr (ref 0–40)

## 2016-05-18 LAB — KAPPA/LAMBDA LIGHT CHAINS
Ig Kappa Free Light Chain: 124.9 mg/L — ABNORMAL HIGH (ref 3.3–19.4)
Ig Lambda Free Light Chain: 39 mg/L — ABNORMAL HIGH (ref 5.7–26.3)
Kappa/Lambda FluidC Ratio: 3.2 — ABNORMAL HIGH (ref 0.26–1.65)

## 2016-05-21 DIAGNOSIS — Z7189 Other specified counseling: Secondary | ICD-10-CM | POA: Insufficient documentation

## 2016-05-23 LAB — MULTIPLE MYELOMA PANEL, SERUM
ALBUMIN/GLOB SERPL: 0.9 (ref 0.7–1.7)
Albumin SerPl Elph-Mcnc: 3.3 g/dL (ref 2.9–4.4)
Alpha 1: 0.3 g/dL (ref 0.0–0.4)
Alpha2 Glob SerPl Elph-Mcnc: 0.6 g/dL (ref 0.4–1.0)
B-GLOBULIN SERPL ELPH-MCNC: 1.1 g/dL (ref 0.7–1.3)
GAMMA GLOB SERPL ELPH-MCNC: 1.8 g/dL (ref 0.4–1.8)
GLOBULIN, TOTAL: 3.8 g/dL (ref 2.2–3.9)
IGA/IMMUNOGLOBULIN A, SERUM: 214 mg/dL (ref 64–422)
IgG, Qn, Serum: 2041 mg/dL — ABNORMAL HIGH (ref 700–1600)
IgM, Qn, Serum: 80 mg/dL (ref 26–217)
Total Protein: 7.1 g/dL (ref 6.0–8.5)

## 2016-05-23 LAB — FLOW CYTOMETRY

## 2016-05-24 ENCOUNTER — Other Ambulatory Visit: Payer: Self-pay | Admitting: Hematology

## 2016-05-24 DIAGNOSIS — C8590 Non-Hodgkin lymphoma, unspecified, unspecified site: Secondary | ICD-10-CM

## 2016-05-27 ENCOUNTER — Ambulatory Visit (HOSPITAL_BASED_OUTPATIENT_CLINIC_OR_DEPARTMENT_OTHER): Payer: Medicare Other

## 2016-05-27 VITALS — BP 139/56 | HR 76 | Temp 98.6°F | Resp 17

## 2016-05-27 DIAGNOSIS — D509 Iron deficiency anemia, unspecified: Secondary | ICD-10-CM | POA: Diagnosis not present

## 2016-05-27 DIAGNOSIS — D508 Other iron deficiency anemias: Secondary | ICD-10-CM

## 2016-05-27 DIAGNOSIS — Z7189 Other specified counseling: Secondary | ICD-10-CM

## 2016-05-27 MED ORDER — SODIUM CHLORIDE 0.9 % IV SOLN
Freq: Once | INTRAVENOUS | Status: AC
  Administered 2016-05-27: 13:00:00 via INTRAVENOUS

## 2016-05-27 MED ORDER — SODIUM CHLORIDE 0.9 % IV SOLN
750.0000 mg | Freq: Once | INTRAVENOUS | Status: AC
  Administered 2016-05-27: 750 mg via INTRAVENOUS
  Filled 2016-05-27: qty 15

## 2016-05-27 NOTE — Patient Instructions (Signed)
Ferric carboxymaltose injection What is this medicine? FERRIC CARBOXYMALTOSE (ferr-ik car-box-ee-mol-toes) is an iron complex. Iron is used to make healthy red blood cells, which carry oxygen and nutrients throughout the body. This medicine is used to treat anemia in people with chronic kidney disease or people who cannot take iron by mouth. This medicine may be used for other purposes; ask your health care provider or pharmacist if you have questions. COMMON BRAND NAME(S): Injectafer What should I tell my health care provider before I take this medicine? They need to know if you have any of these conditions: -anemia not caused by low iron levels -high levels of iron in the blood -liver disease -an unusual or allergic reaction to iron, other medicines, foods, dyes, or preservatives -pregnant or trying to get pregnant -breast-feeding How should I use this medicine? This medicine is for infusion into a vein. It is given by a health care professional in a hospital or clinic setting. Talk to your pediatrician regarding the use of this medicine in children. Special care may be needed. Overdosage: If you think you have taken too much of this medicine contact a poison control center or emergency room at once. NOTE: This medicine is only for you. Do not share this medicine with others. What if I miss a dose? It is important not to miss your dose. Call your doctor or health care professional if you are unable to keep an appointment. What may interact with this medicine? Do not take this medicine with any of the following medications: -deferoxamine -dimercaprol -other iron products This medicine may also interact with the following medications: -chloramphenicol -deferasirox This list may not describe all possible interactions. Give your health care provider a list of all the medicines, herbs, non-prescription drugs, or dietary supplements you use. Also tell them if you smoke, drink alcohol, or use  illegal drugs. Some items may interact with your medicine. What should I watch for while using this medicine? Visit your doctor or health care professional regularly. Tell your doctor if your symptoms do not start to get better or if they get worse. You may need blood work done while you are taking this medicine. You may need to follow a special diet. Talk to your doctor. Foods that contain iron include: whole grains/cereals, dried fruits, beans, or peas, leafy green vegetables, and organ meats (liver, kidney). What side effects may I notice from receiving this medicine? Side effects that you should report to your doctor or health care professional as soon as possible: -allergic reactions like skin rash, itching or hives, swelling of the face, lips, or tongue -breathing problems -changes in blood pressure -feeling faint or lightheaded, falls -flushing, sweating, or hot feelings Side effects that usually do not require medical attention (report to your doctor or health care professional if they continue or are bothersome): -changes in taste -constipation -dizziness -headache -nausea -pain, redness, or irritation at site where injected -vomiting This list may not describe all possible side effects. Call your doctor for medical advice about side effects. You may report side effects to FDA at 1-800-FDA-1088. Where should I keep my medicine? This drug is given in a hospital or clinic and will not be stored at home. NOTE: This sheet is a summary. It may not cover all possible information. If you have questions about this medicine, talk to your doctor, pharmacist, or health care provider.  2018 Elsevier/Gold Standard (2015-02-05 11:20:47)  

## 2016-06-02 ENCOUNTER — Other Ambulatory Visit: Payer: Self-pay | Admitting: *Deleted

## 2016-06-03 ENCOUNTER — Ambulatory Visit: Payer: Medicare Other

## 2016-06-06 ENCOUNTER — Encounter (HOSPITAL_COMMUNITY)
Admission: RE | Admit: 2016-06-06 | Discharge: 2016-06-06 | Disposition: A | Discharge status: Home / Self Care | Payer: Medicare Other | Source: Ambulatory Visit | Attending: Hematology | Admitting: Hematology

## 2016-06-06 DIAGNOSIS — C8590 Non-Hodgkin lymphoma, unspecified, unspecified site: Secondary | ICD-10-CM

## 2016-06-06 LAB — GLUCOSE, CAPILLARY: GLUCOSE-CAPILLARY: 93 mg/dL (ref 65–99)

## 2016-06-06 MED ORDER — FLUDEOXYGLUCOSE F - 18 (FDG) INJECTION
7.7000 | Freq: Once | INTRAVENOUS | Status: AC | PRN
  Administered 2016-06-06: 7.7 via INTRAVENOUS

## 2016-06-07 ENCOUNTER — Telehealth: Payer: Self-pay | Admitting: Hematology

## 2016-06-07 NOTE — Telephone Encounter (Signed)
R/s appt per sch message from Myrtie Neither 5/18 - patient aware of new appt date and time.

## 2016-06-08 ENCOUNTER — Other Ambulatory Visit: Payer: Self-pay | Admitting: Radiology

## 2016-06-08 ENCOUNTER — Other Ambulatory Visit: Payer: Self-pay | Admitting: Student

## 2016-06-09 ENCOUNTER — Ambulatory Visit (HOSPITAL_COMMUNITY)
Admission: RE | Admit: 2016-06-09 | Discharge: 2016-06-09 | Disposition: A | Discharge status: Home / Self Care | Payer: Medicare Other | Source: Ambulatory Visit | Attending: Hematology | Admitting: Hematology

## 2016-06-09 ENCOUNTER — Encounter (HOSPITAL_COMMUNITY): Payer: Self-pay

## 2016-06-09 DIAGNOSIS — C8590 Non-Hodgkin lymphoma, unspecified, unspecified site: Secondary | ICD-10-CM | POA: Diagnosis present

## 2016-06-09 DIAGNOSIS — E079 Disorder of thyroid, unspecified: Secondary | ICD-10-CM | POA: Insufficient documentation

## 2016-06-09 DIAGNOSIS — Z87891 Personal history of nicotine dependence: Secondary | ICD-10-CM | POA: Insufficient documentation

## 2016-06-09 DIAGNOSIS — D72820 Lymphocytosis (symptomatic): Secondary | ICD-10-CM | POA: Diagnosis not present

## 2016-06-09 DIAGNOSIS — R000 Tachycardia, unspecified: Secondary | ICD-10-CM | POA: Insufficient documentation

## 2016-06-09 DIAGNOSIS — Z9889 Other specified postprocedural states: Secondary | ICD-10-CM | POA: Diagnosis not present

## 2016-06-09 DIAGNOSIS — Z79899 Other long term (current) drug therapy: Secondary | ICD-10-CM | POA: Diagnosis not present

## 2016-06-09 DIAGNOSIS — R161 Splenomegaly, not elsewhere classified: Secondary | ICD-10-CM | POA: Diagnosis not present

## 2016-06-09 DIAGNOSIS — D649 Anemia, unspecified: Secondary | ICD-10-CM | POA: Diagnosis not present

## 2016-06-09 DIAGNOSIS — Z7982 Long term (current) use of aspirin: Secondary | ICD-10-CM | POA: Insufficient documentation

## 2016-06-09 DIAGNOSIS — Z8249 Family history of ischemic heart disease and other diseases of the circulatory system: Secondary | ICD-10-CM | POA: Diagnosis not present

## 2016-06-09 DIAGNOSIS — I10 Essential (primary) hypertension: Secondary | ICD-10-CM | POA: Diagnosis not present

## 2016-06-09 LAB — CBC WITH DIFFERENTIAL/PLATELET
Basophils Absolute: 0 10*3/uL (ref 0.0–0.1)
Basophils Relative: 0 %
EOS PCT: 0 %
Eosinophils Absolute: 0 10*3/uL (ref 0.0–0.7)
HEMATOCRIT: 29.5 % — AB (ref 36.0–46.0)
HEMOGLOBIN: 9.9 g/dL — AB (ref 12.0–15.0)
LYMPHS PCT: 70 %
Lymphs Abs: 10.4 10*3/uL — ABNORMAL HIGH (ref 0.7–4.0)
MCH: 28 pg (ref 26.0–34.0)
MCHC: 33.6 g/dL (ref 30.0–36.0)
MCV: 83.3 fL (ref 78.0–100.0)
MONOS PCT: 6 %
Monocytes Absolute: 0.9 10*3/uL (ref 0.1–1.0)
NEUTROS PCT: 24 %
Neutro Abs: 3.6 10*3/uL (ref 1.7–7.7)
Platelets: 88 10*3/uL — ABNORMAL LOW (ref 150–400)
RBC: 3.54 MIL/uL — AB (ref 3.87–5.11)
RDW: 17.8 % — ABNORMAL HIGH (ref 11.5–15.5)
WBC: 14.9 10*3/uL — AB (ref 4.0–10.5)

## 2016-06-09 LAB — PROTIME-INR
INR: 0.97
Prothrombin Time: 12.8 seconds (ref 11.4–15.2)

## 2016-06-09 LAB — PATHOLOGIST SMEAR REVIEW

## 2016-06-09 MED ORDER — FENTANYL CITRATE (PF) 100 MCG/2ML IJ SOLN
INTRAMUSCULAR | Status: AC
  Filled 2016-06-09: qty 4

## 2016-06-09 MED ORDER — SODIUM CHLORIDE 0.9 % IV SOLN
INTRAVENOUS | Status: DC
  Administered 2016-06-09: 10:00:00 via INTRAVENOUS

## 2016-06-09 MED ORDER — MIDAZOLAM HCL 2 MG/2ML IJ SOLN
INTRAMUSCULAR | Status: AC | PRN
  Administered 2016-06-09: 1 mg via INTRAVENOUS

## 2016-06-09 MED ORDER — FENTANYL CITRATE (PF) 100 MCG/2ML IJ SOLN
INTRAMUSCULAR | Status: AC | PRN
  Administered 2016-06-09: 50 ug via INTRAVENOUS

## 2016-06-09 MED ORDER — MIDAZOLAM HCL 2 MG/2ML IJ SOLN
INTRAMUSCULAR | Status: AC
  Filled 2016-06-09: qty 4

## 2016-06-09 NOTE — Consult Note (Signed)
Chief Complaint: Patient was seen in consultation today for CT-guided bone marrow biopsy  Referring Physician(s): Brunetta Genera  Supervising Physician: Marybelle Killings  Patient Status: [facility] - Out-pt  History of Present Illness: Kathleen Arias is a 83 y.o. female with history of anemia, splenomegaly,  leukocytosis, thrombocytopenia, elevated kappa/lambda light chains, as well as major B-cell population noted on peripheral blood flow cytometry. She presents today for CT-guided bone marrow biopsy to rule out plasma cell dyscrasia versus lymphoproliferative disorder.  Past Medical History:  Diagnosis Date  . Arthritis   . Hypertension   . Thyroid disease     Past Surgical History:  Procedure Laterality Date  . CARDIAC CATHETERIZATION N/A 01/07/2016   Procedure: Left Heart Cath and Coronary Angiography;  Surgeon: Jettie Booze, MD;  Location: [facility];  Service: Cardiovascular;  Laterality: N/A;  . CATARACT EXTRACTION, BILATERAL    . HIP SURGERY      Allergies: Patient has no known allergies.  Medications: Prior to Admission medications   Medication Sig Start Date End Date Taking? Authorizing Provider  aspirin EC 325 MG tablet Take 325 mg by mouth every other day.    [provider]  B COMPLEX-C-CALCIUM PO Take 1 tablet by mouth daily.    [provider]  carvedilol (COREG) 6.25 MG tablet Take 6.25 mg by mouth 2 (two) times daily with a meal.     [provider]  hydrochlorothiazide (MICROZIDE) 12.5 MG capsule Take 12.5 mg by mouth daily.    [provider]  levothyroxine (SYNTHROID, LEVOTHROID) 112 MCG tablet Take 112 mcg by mouth See admin instructions. Sun/Tues/Thurs/Sat    [provider]  levothyroxine Wilmer Floor, LEVOTHROID) 125 MCG tablet Take 125 mcg by mouth every Monday, Wednesday, and Friday.  08/21/15   [provider]  losartan (COZAAR) 25 MG tablet Take 12.5 mg by mouth daily.    [provider]  POTASSIUM PO Take 99 mg by mouth 2 (two) times a week.    [provider]  vitamin E 1000 UNIT capsule Take 1,000 Units by mouth every other day.    [provider]     Family History  Problem Relation Age of Onset  . Heart disease Mother     Social History   Social History  . Marital status: Single    Spouse name: N/A  . Number of children: N/A  . Years of education: N/A   Social History Main Topics  . Smoking status: Former Research scientist (life sciences)  . Smokeless tobacco: Never Used  . Alcohol use No  . Drug use: No  . Sexual activity: Not on file   Other Topics Concern  . Not on file   Social History Narrative  . No narrative on file      Review of Systems denies fever, headache, significant chest pain, cough, abdominal pain, nausea, vomiting or abnormal bleeding. She does have some dyspnea with exertion, fatigue, weight loss and occasional back discomfort.  Vital Signs: BP (!) 156/87 (BP Location: Right Arm)   Pulse (!) 105   Temp 97.6 F (36.4 C) (Oral)   Resp 18   SpO2 96%   Physical Exam awake, alert. Chest clear to auscultation bilaterally. Heart with slightly tachycardic but regular rhythm. Abdomen soft, positive bowel sounds, splenomegaly noted, nontender. Lower extremities with trace edema   Mallampati Score:     Imaging: Nm Pet Image Initial (pi) Skull Base To Thigh  Result Date: 06/06/2016 CLINICAL DATA:  Initial treatment  strategy for non-Hodgkin's lymphoma. EXAM: NUCLEAR MEDICINE PET SKULL BASE TO THIGH TECHNIQUE: 7.7 mCi F-18 FDG was injected intravenously. Full-ring PET imaging was performed from the skull base to thigh after the radiotracer. CT data was obtained and used for attenuation correction and anatomic localization. FASTING BLOOD GLUCOSE:  Value: 93 mg/dl COMPARISON:  None. FINDINGS: NECK No hypermetabolic cervical lymph nodes are identified.There are no lesions of the pharyngeal mucosal space. There is mild diffuse thyroid  activity, nonspecific. CHEST There are no hypermetabolic mediastinal, hilar or axillary lymph nodes. There is no suspicious pulmonary activity. The lungs are clear. Mild atherosclerosis of the aorta, great vessels and coronary arteries noted. ABDOMEN/PELVIS The spleen is markedly enlarged, measuring up to 15.9 x 11.1 cm transverse. It extends into the false pelvis and demonstrates moderate, fairly homogeneous hypermetabolic activity with an SUV max of approximately 4.7. There is an area of probable necrosis along the anteromedial aspect of the spleen. There is no hypermetabolic activity within the liver, adrenal glands or kidneys. There are no hypermetabolic abdominopelvic lymph nodes. There is a large calcified gallstone versus gallbladder wall calcification, sigmoid diverticulosis, aortoiliac atherosclerosis and medial displacement of the left kidney by the enlarged spleen. SKELETON There is no hypermetabolic activity to suggest osseous metastatic disease. There is multilevel spondylosis. IMPRESSION: 1. Marked splenomegaly with associated hypermetabolic activity, likely related to the patient's lymphoma. 2. No other hypermetabolic activity within the solid parenchymal organs, lymph nodes or bone marrow. 3. Incidental findings including atherosclerosis, large calcified gallstone versus porcelain gallbladder and distal colonic diverticulosis. Electronically Signed   By: Richardean Sale M.D.   On: 06/06/2016 16:21    Labs:  CBC:  Recent Labs  01/05/16 0037 01/06/16 0416 01/07/16 0456 01/28/16 1546 05/04/16 1435 05/16/16 1239  WBC 11.6* 8.4 8.4 13.8* 13.6* 14.7*  HGB 10.1* 8.5* 9.3*  --   --  9.6*  HCT 31.5* 26.0* 28.4* 28.5* 27.9* 28.7*  PLT 154 119* 137* 149* 118* 131*    COAGS:  Recent Labs  01/07/16 0456  INR 1.01    BMP:  Recent Labs  01/07/16 0456 01/08/16 1242 01/28/16 1546 05/04/16 1435 05/16/16 1239  NA 129* 128* 135 130* 130*  K 3.9 4.2 4.1 4.7 4.1  CL 94* 97* 93* 91*   --   CO2 '28 27 24 27 27  ' GLUCOSE 100* 128* 96 90 87  BUN '7 7 14 15 ' 13.3  CALCIUM 8.7* 8.6* 8.5* 8.8 8.7  CREATININE 0.64 0.61 0.81 0.92 0.8  GFRNONAA >60 >60 65 56*  --   GFRAA >60 >60 75 64  --     LIVER FUNCTION TESTS:  Recent Labs  05/16/16 1239 05/16/16 1239  BILITOT 0.84  --   AST 39*  --   ALT 8  --   ALKPHOS 103  --   PROT 7.3 7.1  ALBUMIN 3.2*  --     TUMOR MARKERS: No results for input(s): AFPTM, CEA, CA199, CHROMGRNA in the last 8760 hours.  Assessment and Plan: 83 y.o. female with history of anemia, splenomegaly,  leukocytosis, thrombocytopenia, elevated kappa/lambda light chains, as well as major B-cell population noted on peripheral blood flow cytometry. She presents today for CT-guided bone marrow biopsy to rule out plasma cell dyscrasia versus lymphoproliferative disorder.Risks and benefits discussed with the patient/family including, but not limited to bleeding, infection, damage to adjacent structures or low yield requiring additional tests.All of the patient's questions were answered, patient is agreeable to proceed.Consent signed and in chart.  Thank you for this interesting consult.  I greatly enjoyed meeting Kathleen Arias and look forward to participating in their care.  A copy of this report was sent to the requesting provider on this date.  Electronically Signed: D. Rowe Robert, PA-C 06/09/2016, 9:13 AM   I spent a total of 25 minutes  in face to face in clinical consultation, greater than 50% of which was counseling/coordinating care for CT-guided bone marrow biopsy

## 2016-06-09 NOTE — Discharge Instructions (Signed)
Moderate Conscious Sedation, Adult, Care After These instructions provide you with information about caring for yourself after your procedure. Your health care provider may also give you more specific instructions. Your treatment has been planned according to current medical practices, but problems sometimes occur. Call your health care provider if you have any problems or questions after your procedure. What can I expect after the procedure? After your procedure, it is common:  To feel sleepy for several hours.  To feel clumsy and have poor balance for several hours.  To have poor judgment for several hours.  To vomit if you eat too soon. Follow these instructions at home: For at least 24 hours after the procedure:    Do not:  Participate in activities where you could fall or become injured.  Drive.  Use heavy machinery.  Drink alcohol.  Take sleeping pills or medicines that cause drowsiness.  Make important decisions or sign legal documents.  Take care of children on your own.  Rest. Eating and drinking   Follow the diet recommended by your health care provider.  If you vomit:  Drink water, juice, or soup when you can drink without vomiting.  Make sure you have little or no nausea before eating solid foods. General instructions   Have a responsible adult stay with you until you are awake and alert.  Take over-the-counter and prescription medicines only as told by your health care provider.  If you smoke, do not smoke without supervision.  Keep all follow-up visits as told by your health care provider. This is important. Contact a health care provider if:  You keep feeling nauseous or you keep vomiting.  You feel light-headed.  You develop a rash.  You have a fever. Get help right away if:  You have trouble breathing. This information is not intended to replace advice given to you by your health care provider. Make sure you discuss any questions you  have with your health care provider. Document Released: 10/24/2012 Document Revised: 06/08/2015 Document Reviewed: 04/25/2015 Elsevier Interactive Patient Education  2017 [place]. Bone Marrow Aspiration and Bone Marrow Biopsy, Adult, Care After This sheet gives you information about how to care for yourself after your procedure. Your health care provider may also give you more specific instructions. If you have problems or questions, contact your health care provider. What can I expect after the procedure? After the procedure, it is common to have:  Mild pain and tenderness.  Swelling.  Bruising. Follow these instructions at home:  Take over-the-counter or prescription medicines only as told by your health care provider.  Do not take baths, swim, or use a hot tub until your health care provider approves. Ask if you can take a shower or have a sponge bath.  Follow instructions from your health care provider about how to take care of the puncture site. Make sure you:  Wash your hands with soap and water before you change your bandage (dressing). If soap and water are not available, use hand sanitizer.  Change your dressing as told by your health care provider.  Check your puncture siteevery day for signs of infection. Check for:  More redness, swelling, or pain.  More fluid or blood.  Warmth.  Pus or a bad smell.  Return to your normal activities as told by your health care provider. Ask your health care provider what activities are safe for you.  Do not drive for 24 hours if you were given a medicine to help you relax (  sedative). °· Keep all follow-up visits as told by your health care provider. This is important. °Contact a health care provider if: °· You have more redness, swelling, or pain around the puncture site. °· You have more fluid or blood coming from the puncture site. °· Your puncture site feels warm to the touch. °· You have pus or a bad smell coming from the  puncture site. °· You have a fever. °· Your pain is not controlled with medicine. °This information is not intended to replace advice given to you by your health care provider. Make sure you discuss any questions you have with your health care provider. °Document Released: 07/23/2004 Document Revised: 07/24/2015 Document Reviewed: 06/17/2015 °Elsevier Interactive Patient Education © 2017 [employer]vier Inc. ° °

## 2016-06-09 NOTE — Procedures (Signed)
BM aspirate and biopsy EBL 0 Comp 0

## 2016-06-15 ENCOUNTER — Other Ambulatory Visit (HOSPITAL_BASED_OUTPATIENT_CLINIC_OR_DEPARTMENT_OTHER): Payer: Medicare Other

## 2016-06-15 ENCOUNTER — Telehealth: Payer: Self-pay | Admitting: Hematology

## 2016-06-15 ENCOUNTER — Ambulatory Visit (HOSPITAL_BASED_OUTPATIENT_CLINIC_OR_DEPARTMENT_OTHER): Payer: Medicare Other | Admitting: Hematology

## 2016-06-15 ENCOUNTER — Encounter: Payer: Self-pay | Admitting: Hematology

## 2016-06-15 VITALS — BP 150/62 | HR 94 | Temp 97.8°F | Resp 18 | Ht 63.0 in | Wt 118.9 lb

## 2016-06-15 DIAGNOSIS — Z7189 Other specified counseling: Secondary | ICD-10-CM

## 2016-06-15 DIAGNOSIS — D509 Iron deficiency anemia, unspecified: Secondary | ICD-10-CM | POA: Diagnosis not present

## 2016-06-15 DIAGNOSIS — D72820 Lymphocytosis (symptomatic): Secondary | ICD-10-CM | POA: Diagnosis not present

## 2016-06-15 DIAGNOSIS — C884 Extranodal marginal zone B-cell lymphoma of mucosa-associated lymphoid tissue [MALT-lymphoma]: Secondary | ICD-10-CM | POA: Diagnosis not present

## 2016-06-15 DIAGNOSIS — D696 Thrombocytopenia, unspecified: Secondary | ICD-10-CM

## 2016-06-15 DIAGNOSIS — E039 Hypothyroidism, unspecified: Secondary | ICD-10-CM | POA: Diagnosis not present

## 2016-06-15 DIAGNOSIS — C8307 Small cell B-cell lymphoma, spleen: Secondary | ICD-10-CM

## 2016-06-15 DIAGNOSIS — D649 Anemia, unspecified: Secondary | ICD-10-CM | POA: Diagnosis not present

## 2016-06-15 LAB — IRON AND TIBC
%SAT: 19 % — AB (ref 21–57)
Iron: 66 ug/dL (ref 41–142)
TIBC: 354 ug/dL (ref 236–444)
UIBC: 288 ug/dL (ref 120–384)

## 2016-06-15 LAB — COMPREHENSIVE METABOLIC PANEL
ALT: 8 U/L (ref 0–55)
AST: 34 U/L (ref 5–34)
Albumin: 3.2 g/dL — ABNORMAL LOW (ref 3.5–5.0)
Alkaline Phosphatase: 102 U/L (ref 40–150)
Anion Gap: 8 mEq/L (ref 3–11)
BUN: 11.7 mg/dL (ref 7.0–26.0)
CHLORIDE: 98 meq/L (ref 98–109)
CO2: 28 mEq/L (ref 22–29)
CREATININE: 0.7 mg/dL (ref 0.6–1.1)
Calcium: 8.7 mg/dL (ref 8.4–10.4)
EGFR: 75 mL/min/{1.73_m2} — ABNORMAL LOW (ref >90)
GLUCOSE: 87 mg/dL (ref 70–140)
POTASSIUM: 3.8 meq/L (ref 3.5–5.1)
SODIUM: 134 meq/L — AB (ref 136–145)
Total Bilirubin: 0.84 mg/dL (ref 0.20–1.20)
Total Protein: 7.4 g/dL (ref 6.4–8.3)

## 2016-06-15 LAB — MANUAL DIFFERENTIAL
ALC: 10.2 10*3/uL — AB (ref 0.9–3.3)
ANC (CHCC manual diff): 3.9 10*3/uL (ref 1.5–6.5)
Band Neutrophils: 2 % (ref 0–10)
LYMPH: 43 % (ref 14–49)
MONO: 3 % (ref 0–14)
PLT EST: DECREASED
SEG: 25 % — AB (ref 38–77)
Variant Lymph: 27 % — ABNORMAL HIGH (ref 0–0)

## 2016-06-15 LAB — CBC & DIFF AND RETIC
HCT: 31.5 % — ABNORMAL LOW (ref 34.8–46.6)
HGB: 10.4 g/dL — ABNORMAL LOW (ref 11.6–15.9)
IMMATURE RETIC FRACT: 4.8 % (ref 1.60–10.00)
MCH: 28.3 pg (ref 25.1–34.0)
MCHC: 33 g/dL (ref 31.5–36.0)
MCV: 85.8 fL (ref 79.5–101.0)
Platelets: 93 10*3/uL — ABNORMAL LOW (ref 145–400)
RBC: 3.67 10*6/uL — AB (ref 3.70–5.45)
RDW: 17.9 % — ABNORMAL HIGH (ref 11.2–14.5)
RETIC %: 3.41 % — AB (ref 0.70–2.10)
RETIC CT ABS: 125.15 10*3/uL — AB (ref 33.70–90.70)
WBC: 14.6 10*3/uL — ABNORMAL HIGH (ref 3.9–10.3)

## 2016-06-15 LAB — FERRITIN: Ferritin: 266 ng/ml (ref 9–269)

## 2016-06-15 MED ORDER — PREDNISONE 20 MG PO TABS
20.0000 mg | ORAL_TABLET | Freq: Every day | ORAL | 0 refills | Status: DC
Start: 2016-06-15 — End: 2016-12-20

## 2016-06-15 NOTE — Telephone Encounter (Signed)
No care plan 06/15/16

## 2016-06-15 NOTE — Telephone Encounter (Signed)
Appointments scheduled per 06/15/16 los. Patient was given a copy of the AVS report and appointment schedule per 06/15/16 los. °

## 2016-06-16 ENCOUNTER — Encounter (HOSPITAL_COMMUNITY): Payer: Medicare Other

## 2016-06-20 DIAGNOSIS — C8307 Small cell B-cell lymphoma, spleen: Secondary | ICD-10-CM | POA: Insufficient documentation

## 2016-06-20 NOTE — Progress Notes (Signed)
START ON PATHWAY REGIMEN - Lymphoma and CLL     Administer weekly:     Rituximab   **Always confirm dose/schedule in your pharmacy ordering system**    Patient Characteristics: Marginal Zone Lymphoma, Systemic, First Line, Symptomatic Disease Type: Marginal Zone Disease Type: Not Applicable Localized or Systemic Disease? Systemic [place] Stage: IV Line of therapy: First Line Asymptomatic or Symptomatic? Symptomatic  Intent of Therapy: Non-Curative / Palliative Intent, Discussed with Patient

## 2016-06-22 ENCOUNTER — Encounter: Payer: Self-pay | Admitting: *Deleted

## 2016-06-22 ENCOUNTER — Other Ambulatory Visit: Payer: Medicare Other

## 2016-06-22 NOTE — Progress Notes (Signed)
[facility]    HEMATOLOGY/ONCOLOGY CONSULTATION NOTE  Date of Service: .06/15/2016  Patient Care Team: [name], MD as PCP - General (Family Medicine)  CHIEF COMPLAINTS/PURPOSE OF CONSULTATION:  Anemia Lymphocytosis Thrombocytopenia  HISTORY OF PRESENTING ILLNESS:   Kathleen Arias is a wonderful 83 y.o. female who has been referred to [place] by Dr .Redmond Pulling, Jama Flavors, MD for evaluation and management of anemia, leukocytosis and thrombocytopenia.  Patient is a very pleasant female who is here with her daughter. She has a history of hypertension, chronic systolic CHF [ejection fraction of 25-35% based on recent cardiac catheterization on 01/13/2016, echo done showed ejection fraction of 40-45%, nonischemic cardiomyopathy], iron deficiency,  Patient presented to his primary care physician with some dyspnea on exertion which is primarily related to her systolic CHF but she was also noted to have recent blood counts with her primary care physician on 03/15/2016 which showed anemia with a hemoglobin of 9.7 and a hematocrit of 79, platelet count of 137k , WBC count of 11.7k with 7.6k lymphocytes.  Iron studies were done which showed a ferritin of 84 and an iron saturation of 9%. She has been on oral iron replacement by her primary care physician and notes significant constipation and GI intolerance. She reports that she had a FOBT in 12/2015  which was negative.   Patient notes significant fatigue and dyspnea on exertion. Notes weight loss of about 15 pounds since September 2017. Weight is down from 142 to 124lbs.   Patient denies any enlarged lymph nodes, no night sweats.  INTERVAL HISTORY  Mrs. Altieri is here for follow-up along with her family to discuss the results of her labs, PET/CT scan and bone marrow biopsy and to determine further treatment options. She notes that she continues to have fair amount of fatigue and some night sweats as well as some abdominal discomfort in the left upper abdomen  related to her significant splenomegaly. Her lab bone marrow and PET/CT findings are consistent with splenic marginal zone lymphoma. We discussed that she would meet criteria for consideration of treatment given her constitutional symptoms, cytopenias and symptomatic splenomegaly. After extensive discussion she and her family would like to proceed with Rituxan.   MEDICAL HISTORY:  Past Medical History:  Diagnosis Date  . Arthritis   . Hypertension   . Thyroid disease   Chronic systolic CHF Iron deficiency   SURGICAL HISTORY: Past Surgical History:  Procedure Laterality Date  . CARDIAC CATHETERIZATION N/A 01/07/2016   Procedure: Left Heart Cath and Coronary Angiography;  Surgeon: Jettie Booze, MD;  Location: [facility];  Service: Cardiovascular;  Laterality: N/A;  . CATARACT EXTRACTION, BILATERAL    . HIP SURGERY      SOCIAL HISTORY: Social History   Social History  . Marital status: Single    Spouse name: N/A  . Number of children: N/A  . Years of education: N/A   Occupational History  . Not on file.   Social History Main Topics  . Smoking status: Former Research scientist (life sciences)  . Smokeless tobacco: Never Used  . Alcohol use No  . Drug use: No  . Sexual activity: Not on file   Other Topics Concern  . Not on file   Social History Narrative  . No narrative on file    FAMILY HISTORY: Family History  Problem Relation Age of Onset  . Heart disease Mother     ALLERGIES:  has No Known Allergies.  MEDICATIONS:  Current Outpatient Prescriptions  Medication Sig Dispense  Refill  . aspirin EC 325 MG tablet Take 325 mg by mouth every other day.    . B COMPLEX-C-CALCIUM PO Take 1 tablet by mouth daily.    . carvedilol (COREG) 6.25 MG tablet Take 6.25 mg by mouth 2 (two) times daily with a meal.     . hydrochlorothiazide (MICROZIDE) 12.5 MG capsule Take 12.5 mg by mouth daily.    Marland Kitchen levothyroxine (SYNTHROID, LEVOTHROID) 112 MCG tablet Take 112 mcg by mouth See admin  instructions. Sun/Tues/Thurs/Sat    . levothyroxine (SYNTHROID, LEVOTHROID) 125 MCG tablet Take 125 mcg by mouth every Monday, Wednesday, and Friday.     . losartan (COZAAR) 25 MG tablet Take 12.5 mg by mouth daily.    Marland Kitchen POTASSIUM PO Take 99 mg by mouth 2 (two) times a week.    . predniSONE (DELTASONE) 20 MG tablet Take 1 tablet (20 mg total) by mouth daily with breakfast. 10 tablet 0  . vitamin E 1000 UNIT capsule Take 1,000 Units by mouth every other day.     No current facility-administered medications for this visit.     REVIEW OF SYSTEMS:    10 Point review of Systems was done is negative except as noted above.  PHYSICAL EXAMINATION: ECOG PERFORMANCE STATUS: 2 - Symptomatic, <50% confined to bed  Vital Signs reviewed in EPIC. GENERAL:alert, in no acute distress and comfortable SKIN: no acute rashes, no significant lesions EYES: conjunctiva are pink and non-injected, sclera anicteric OROPHARYNX: MMM, no exudates, no oropharyngeal erythema or ulceration NECK: supple, Mild JVD LYMPH:  no palpable lymphadenopathy in the cervical, axillary or inguinal regions LUNGS: clear to auscultation b/l with normal respiratory effort HEART: regular rate & rhythm ABDOMEN:  normoactive bowel sounds , non tender, not distended. no allergic palpable hepatosplenomegaly  Extremity: 1+  pedal edema Bilaterally  PSYCH: alert & oriented x 3 with fluent speech NEURO: no focal motor/sensory deficits  LABORATORY DATA:  I have reviewed the data as listed  . CBC Latest Ref Rng & Units 06/15/2016 06/09/2016 05/16/2016  WBC 3.9 - 10.3 10e3/uL 14.6(H) 14.9(H) 14.7(H)  Hemoglobin 11.6 - 15.9 g/dL 10.4(L) 9.9(L) 9.6(L)  Hematocrit 34.8 - 46.6 % 31.5(L) 29.5(L) 28.7(L)  Platelets 145 - 400 10e3/uL 93(L) 88(L) 131(L)   . CBC    Component Value Date/Time   WBC 14.6 (H) 06/15/2016 0921   WBC 14.9 (H) 06/09/2016 0938   RBC 3.67 (L) 06/15/2016 0921   RBC 3.54 (L) 06/09/2016 0938   HGB 10.4 (L) 06/15/2016  0921   HCT 31.5 (L) 06/15/2016 0921   PLT 93 (L) 06/15/2016 0921   PLT 118 (L) 05/04/2016 1435   MCV 85.8 06/15/2016 0921   MCH 28.3 06/15/2016 0921   MCH 28.0 06/09/2016 0938   MCHC 33.0 06/15/2016 0921   MCHC 33.6 06/09/2016 0938   RDW 17.9 (H) 06/15/2016 0921   LYMPHSABS 10.4 (H) 06/09/2016 0938   LYMPHSABS 11.0 (H) 05/16/2016 1239   MONOABS 0.9 06/09/2016 0938   MONOABS 0.6 05/16/2016 1239   EOSABS 0.0 06/09/2016 0938   EOSABS 0.0 05/16/2016 1239   EOSABS 0.1 05/04/2016 1435   BASOSABS 0.0 06/09/2016 0938   BASOSABS 0.2 (H) 05/16/2016 1239    . CMP Latest Ref Rng & Units 06/15/2016 05/16/2016 05/16/2016  Glucose 70 - 140 mg/dl 87 87 -  BUN 7.0 - 26.0 mg/dL 11.7 13.3 -  Creatinine 0.6 - 1.1 mg/dL 0.7 0.8 -  Sodium 136 - 145 mEq/L 134(L) 130(L) -  Potassium 3.5 - 5.1 mEq/L  3.8 4.1 -  Chloride 96 - 106 mmol/L - - -  CO2 22 - 29 mEq/L 28 27 -  Calcium 8.4 - 10.4 mg/dL 8.7 8.7 -  Total Protein 6.4 - 8.3 g/dL 7.4 7.3 7.1  Total Bilirubin 0.20 - 1.20 mg/dL 0.84 0.84 -  Alkaline Phos 40 - 150 U/L 102 103 -  AST 5 - 34 U/L 34 39(H) -  ALT 0 - 55 U/L 8 8 -    . Lab Results  Component Value Date   IRON 66 06/15/2016   TIBC 354 06/15/2016   IRONPCTSAT 19 (L) 06/15/2016   (Iron and TIBC)  Lab Results  Component Value Date   FERRITIN 266 06/15/2016    Lab Results  Component Value Date   LDH 573 (H) 05/16/2016   Component     Latest Ref Rng & Units 05/16/2016  Sed Rate     0 - 40 mm/hr 22  Erythropoietin     2.6 - 18.5 mIU/mL 27.2 (H)  LDH     125 - 245 U/L 573 (H)  Haptoglobin     34 - 200 mg/dL 53            RADIOGRAPHIC STUDIES: I have personally reviewed the radiological images as listed and agreed with the findings in the report. Nm Pet Image Initial (pi) Skull Base To Thigh  Result Date: 06/06/2016 CLINICAL DATA:  Initial treatment strategy for non-Hodgkin's lymphoma. EXAM: NUCLEAR MEDICINE PET SKULL BASE TO THIGH TECHNIQUE: 7.7 mCi F-18 FDG was  injected intravenously. Full-ring PET imaging was performed from the skull base to thigh after the radiotracer. CT data was obtained and used for attenuation correction and anatomic localization. FASTING BLOOD GLUCOSE:  Value: 93 mg/dl COMPARISON:  None. FINDINGS: NECK No hypermetabolic cervical lymph nodes are identified.There are no lesions of the pharyngeal mucosal space. There is mild diffuse thyroid activity, nonspecific. CHEST There are no hypermetabolic mediastinal, hilar or axillary lymph nodes. There is no suspicious pulmonary activity. The lungs are clear. Mild atherosclerosis of the aorta, great vessels and coronary arteries noted. ABDOMEN/PELVIS The spleen is markedly enlarged, measuring up to 15.9 x 11.1 cm transverse. It extends into the false pelvis and demonstrates moderate, fairly homogeneous hypermetabolic activity with an SUV max of approximately 4.7. There is an area of probable necrosis along the anteromedial aspect of the spleen. There is no hypermetabolic activity within the liver, adrenal glands or kidneys. There are no hypermetabolic abdominopelvic lymph nodes. There is a large calcified gallstone versus gallbladder wall calcification, sigmoid diverticulosis, aortoiliac atherosclerosis and medial displacement of the left kidney by the enlarged spleen. SKELETON There is no hypermetabolic activity to suggest osseous metastatic disease. There is multilevel spondylosis. IMPRESSION: 1. Marked splenomegaly with associated hypermetabolic activity, likely related to the patient's lymphoma. 2. No other hypermetabolic activity within the solid parenchymal organs, lymph nodes or bone marrow. 3. Incidental findings including atherosclerosis, large calcified gallstone versus porcelain gallbladder and distal colonic diverticulosis. Electronically Signed   By: Richardean Sale M.D.   On: 06/06/2016 16:21   Ct Biopsy  Result Date: 06/09/2016 INDICATION: Lymphoma EXAM: CT BIOPSY; CT BONE MARROW BIOPSY AND  ASPIRATION MEDICATIONS: None. ANESTHESIA/SEDATION: Fentanyl 50 mcg IV; Versed 1 mg IV Moderate Sedation Time:  13 The patient was continuously monitored during the procedure by the interventional radiology nurse under my direct supervision. FLUOROSCOPY TIME:  None COMPLICATIONS: None immediate. PROCEDURE: Informed written consent was obtained from the patient after a thorough discussion of the procedural risks, benefits  and alternatives. All questions were addressed. Maximal Sterile Barrier Technique was utilized including caps, mask, sterile gowns, sterile gloves, sterile drape, hand hygiene and skin antiseptic. A timeout was performed prior to the initiation of the procedure. Under CT guidance, an 11 gauge needle was inserted into the right iliac bone. Aspirates and a core were obtain. Post biopsy images demonstrate no hemorrhage. Patient tolerated the procedure well without complication. Vital sign monitoring by nursing staff during the procedure will continue as patient is in the special procedures unit for post procedure observation. FINDINGS: The images document guide needle placement within the right iliac bone. Post biopsy images demonstrate no hemorrhage. IMPRESSION: Successful right iliac bone marrow aspirate and core. Electronically Signed   By: Marybelle Killings M.D.   On: 06/09/2016 15:28   Ct Bone Marrow Biopsy & Aspiration  Result Date: 06/09/2016 INDICATION: Lymphoma EXAM: CT BIOPSY; CT BONE MARROW BIOPSY AND ASPIRATION MEDICATIONS: None. ANESTHESIA/SEDATION: Fentanyl 50 mcg IV; Versed 1 mg IV Moderate Sedation Time:  13 The patient was continuously monitored during the procedure by the interventional radiology nurse under my direct supervision. FLUOROSCOPY TIME:  None COMPLICATIONS: None immediate. PROCEDURE: Informed written consent was obtained from the patient after a thorough discussion of the procedural risks, benefits and alternatives. All questions were addressed. Maximal Sterile Barrier  Technique was utilized including caps, mask, sterile gowns, sterile gloves, sterile drape, hand hygiene and skin antiseptic. A timeout was performed prior to the initiation of the procedure. Under CT guidance, an 11 gauge needle was inserted into the right iliac bone. Aspirates and a core were obtain. Post biopsy images demonstrate no hemorrhage. Patient tolerated the procedure well without complication. Vital sign monitoring by nursing staff during the procedure will continue as patient is in the special procedures unit for post procedure observation. FINDINGS: The images document guide needle placement within the right iliac bone. Post biopsy images demonstrate no hemorrhage. IMPRESSION: Successful right iliac bone marrow aspirate and core. Electronically Signed   By: Marybelle Killings M.D.   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 Bone marrow involvement as noted above with anemia, lymphocytosis, thrombocytopenia. Patient  Has significant constitutional symptoms including fatigue, some low-grade fevers and night sweats. Also has abdominal discomfort related to her splenomegaly and anorexia. Serum kappa lambda free light chain ratio abnormally elevated. 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  3) hypothyroidism 4) nonischemic systolic cardiomyopathy - ejection fraction 40-45% on echo. Cardiac catheterization December 2017- nonobstructive coronary artery disease.  PLAN -Patient received IV injected for 1 dose for possible mild IV iron deficiency and notes that she feels somewhat better. -We discussed all her labs, bone marrow biopsy results and reviewed her PET scan images and results. -We discussed her new diagnosis, prognosis, treatment options and rationale in details. -We  discussed the option of treatment with single agent Rituxan especially in the setting of constitutional symptoms cytopenias and significant symptomatic splenomegaly. -Patient is agreeable to this and informed consent was obtained. -She has been set up for chemotherapy counseling regarding this. -We did start her on prednisone 20 mg by mouth daily to help with some of her distressing constitutional symptoms pending starting Rituxan.  Rituxan for splenic marginal zone lymphoma to start in 7-10 days Chemo-counseling for Rituxan RTC with Dr [name]  1 week after 1st dose of Rituxan with labs for a toxicity check.  All of the patients and her accompanying family's questions were answered with apparent satisfaction. The patient knows to call the clinic with any problems, questions or concerns.  I spent 30 minutes counseling the patient face to face. The total time spent in the appointment was 45 minutes and more than 50% was on counseling and direct patient cares.    Sullivan Lone MD [place] AAHIVMS [facility] Hematology/Oncology Physician [facility]  (Office):       [phone] (Work cell):  [phone] (Fax):           [phone]

## 2016-06-23 ENCOUNTER — Other Ambulatory Visit: Payer: Self-pay | Admitting: Pharmacist

## 2016-06-23 ENCOUNTER — Telehealth: Payer: Self-pay | Admitting: Hematology

## 2016-06-23 DIAGNOSIS — C8307 Small cell B-cell lymphoma, spleen: Secondary | ICD-10-CM

## 2016-06-23 NOTE — Telephone Encounter (Signed)
Added appt per sch message from [facility] 6/6.

## 2016-06-24 ENCOUNTER — Encounter: Payer: Self-pay | Admitting: *Deleted

## 2016-06-27 ENCOUNTER — Other Ambulatory Visit: Payer: Medicare Other

## 2016-06-27 ENCOUNTER — Encounter (HOSPITAL_COMMUNITY): Payer: Self-pay

## 2016-06-27 ENCOUNTER — Ambulatory Visit (HOSPITAL_BASED_OUTPATIENT_CLINIC_OR_DEPARTMENT_OTHER): Payer: Medicare Other

## 2016-06-27 ENCOUNTER — Ambulatory Visit: Payer: Medicare Other | Admitting: Hematology

## 2016-06-27 ENCOUNTER — Other Ambulatory Visit (HOSPITAL_BASED_OUTPATIENT_CLINIC_OR_DEPARTMENT_OTHER): Payer: Medicare Other

## 2016-06-27 VITALS — BP 131/59 | HR 104 | Temp 100.5°F | Resp 18

## 2016-06-27 DIAGNOSIS — C884 Extranodal marginal zone B-cell lymphoma of mucosa-associated lymphoid tissue [MALT-lymphoma]: Secondary | ICD-10-CM

## 2016-06-27 DIAGNOSIS — C8307 Small cell B-cell lymphoma, spleen: Secondary | ICD-10-CM

## 2016-06-27 DIAGNOSIS — Z5112 Encounter for antineoplastic immunotherapy: Secondary | ICD-10-CM | POA: Diagnosis not present

## 2016-06-27 LAB — CBC & DIFF AND RETIC
BASO%: 1.5 % (ref 0.0–2.0)
BASOS ABS: 0.3 10*3/uL — AB (ref 0.0–0.1)
EOS ABS: 0 10*3/uL (ref 0.0–0.5)
EOS%: 0 % (ref 0.0–7.0)
HEMATOCRIT: 30.5 % — AB (ref 34.8–46.6)
HEMOGLOBIN: 10.3 g/dL — AB (ref 11.6–15.9)
Immature Retic Fract: 6.2 % (ref 1.60–10.00)
LYMPH#: 14.9 10*3/uL — AB (ref 0.9–3.3)
LYMPH%: 80.2 % — ABNORMAL HIGH (ref 14.0–49.7)
MCH: 28.3 pg (ref 25.1–34.0)
MCHC: 33.8 g/dL (ref 31.5–36.0)
MCV: 83.7 fL (ref 79.5–101.0)
MONO#: 0.2 10*3/uL (ref 0.1–0.9)
MONO%: 1 % (ref 0.0–14.0)
NEUT#: 3.2 10*3/uL (ref 1.5–6.5)
NEUT%: 17.3 % — ABNORMAL LOW (ref 38.4–76.8)
Platelets: 110 10*3/uL — ABNORMAL LOW (ref 145–400)
RBC: 3.64 10*6/uL — ABNORMAL LOW (ref 3.70–5.45)
RDW: 18.1 % — AB (ref 11.2–14.5)
RETIC %: 2.62 % — AB (ref 0.70–2.10)
RETIC CT ABS: 95.37 10*3/uL — AB (ref 33.70–90.70)
WBC: 18.6 10*3/uL — ABNORMAL HIGH (ref 3.9–10.3)

## 2016-06-27 LAB — COMPREHENSIVE METABOLIC PANEL
ALBUMIN: 3 g/dL — AB (ref 3.5–5.0)
ALK PHOS: 85 U/L (ref 40–150)
ALT: 7 U/L (ref 0–55)
ANION GAP: 9 meq/L (ref 3–11)
AST: 30 U/L (ref 5–34)
BUN: 14.4 mg/dL (ref 7.0–26.0)
CALCIUM: 9 mg/dL (ref 8.4–10.4)
CO2: 27 mEq/L (ref 22–29)
CREATININE: 0.7 mg/dL (ref 0.6–1.1)
Chloride: 99 mEq/L (ref 98–109)
EGFR: 77 mL/min/{1.73_m2} — ABNORMAL LOW (ref >90)
Glucose: 94 mg/dl (ref 70–140)
Potassium: 4.3 mEq/L (ref 3.5–5.1)
Sodium: 135 mEq/L — ABNORMAL LOW (ref 136–145)
TOTAL PROTEIN: 7.2 g/dL (ref 6.4–8.3)
Total Bilirubin: 0.69 mg/dL (ref 0.20–1.20)

## 2016-06-27 LAB — TECHNOLOGIST REVIEW

## 2016-06-27 LAB — LACTATE DEHYDROGENASE: LDH: 394 U/L — AB (ref 125–245)

## 2016-06-27 MED ORDER — DEXAMETHASONE SODIUM PHOSPHATE 10 MG/ML IJ SOLN
10.0000 mg | Freq: Once | INTRAMUSCULAR | Status: AC
  Administered 2016-06-27: 10 mg via INTRAVENOUS

## 2016-06-27 MED ORDER — SODIUM CHLORIDE 0.9 % IV SOLN
Freq: Once | INTRAVENOUS | Status: AC
  Administered 2016-06-27: 08:00:00 via INTRAVENOUS

## 2016-06-27 MED ORDER — MEPERIDINE HCL 25 MG/ML IJ SOLN
INTRAMUSCULAR | Status: AC
  Filled 2016-06-27: qty 1

## 2016-06-27 MED ORDER — SODIUM CHLORIDE 0.9 % IV SOLN
375.0000 mg/m2 | Freq: Once | INTRAVENOUS | Status: AC
  Administered 2016-06-27: 600 mg via INTRAVENOUS
  Filled 2016-06-27: qty 50

## 2016-06-27 MED ORDER — DIPHENHYDRAMINE HCL 25 MG PO CAPS
ORAL_CAPSULE | ORAL | Status: AC
  Filled 2016-06-27: qty 2

## 2016-06-27 MED ORDER — ACETAMINOPHEN 325 MG PO TABS
650.0000 mg | ORAL_TABLET | Freq: Once | ORAL | Status: AC
  Administered 2016-06-27: 650 mg via ORAL

## 2016-06-27 MED ORDER — ACETAMINOPHEN 325 MG PO TABS
ORAL_TABLET | ORAL | Status: AC
  Filled 2016-06-27: qty 2

## 2016-06-27 MED ORDER — METHYLPREDNISOLONE SODIUM SUCC 125 MG IJ SOLR
INTRAMUSCULAR | Status: AC
  Filled 2016-06-27: qty 2

## 2016-06-27 MED ORDER — METHYLPREDNISOLONE SODIUM SUCC 125 MG IJ SOLR
60.0000 mg | Freq: Once | INTRAMUSCULAR | Status: AC
  Administered 2016-06-27: 60 mg via INTRAVENOUS

## 2016-06-27 MED ORDER — DEXAMETHASONE SODIUM PHOSPHATE 10 MG/ML IJ SOLN
INTRAMUSCULAR | Status: AC
  Filled 2016-06-27: qty 1

## 2016-06-27 MED ORDER — MEPERIDINE HCL 25 MG/ML IJ SOLN
12.5000 mg | Freq: Once | INTRAMUSCULAR | Status: AC
  Administered 2016-06-27: 12.5 mg via INTRAVENOUS

## 2016-06-27 MED ORDER — DEXAMETHASONE SODIUM PHOSPHATE 100 MG/10ML IJ SOLN: 10.0000 mg | Freq: Once | INTRAMUSCULAR | Status: DC

## 2016-06-27 MED ORDER — DIPHENHYDRAMINE HCL 25 MG PO CAPS
50.0000 mg | ORAL_CAPSULE | Freq: Once | ORAL | Status: AC
  Administered 2016-06-27: 50 mg via ORAL

## 2016-06-27 NOTE — Patient Instructions (Signed)
[facility] Discharge Instructions for Patients Receiving Chemotherapy  Today you received the following chemotherapy agents Rituxan  To help prevent nausea and vomiting after your treatment, we encourage you to take your nausea medication as directed.  If you develop nausea and vomiting that is not controlled by your nausea medication, call the clinic.   BELOW ARE SYMPTOMS THAT SHOULD BE REPORTED IMMEDIATELY:  *FEVER GREATER THAN 100.5 F  *CHILLS WITH OR WITHOUT FEVER  NAUSEA AND VOMITING THAT IS NOT CONTROLLED WITH YOUR NAUSEA MEDICATION  *UNUSUAL SHORTNESS OF BREATH  *UNUSUAL BRUISING OR BLEEDING  TENDERNESS IN MOUTH AND THROAT WITH OR WITHOUT PRESENCE OF ULCERS  *URINARY PROBLEMS  *BOWEL PROBLEMS  UNUSUAL RASH Items with * indicate a potential emergency and should be followed up as soon as possible.  Feel free to call the clinic you have any questions or concerns. The clinic phone number is [phone].  Please show the CHEMO ALERT CARD at check-in to the Emergency Department and triage nurse.      Rituximab injection What is this medicine? RITUXIMAB (ri TUX i mab) is a monoclonal antibody. It is used to treat certain types of cancer like non-Hodgkin lymphoma and chronic lymphocytic leukemia. It is also used to treat rheumatoid arthritis, granulomatosis with polyangiitis (or Wegener's granulomatosis), and microscopic polyangiitis. This medicine may be used for other purposes; ask your health care provider or pharmacist if you have questions. COMMON BRAND NAME(S): Rituxan What should I tell my health care provider before I take this medicine? They need to know if you have any of these conditions: -heart disease -infection (especially a virus infection such as hepatitis B, chickenpox, cold sores, or herpes) -immune system problems -irregular heartbeat -kidney disease -lung or breathing disease, like asthma -recently received or scheduled to  receive a vaccine -an unusual or allergic reaction to rituximab, mouse proteins, other medicines, foods, dyes, or preservatives -pregnant or trying to get pregnant -breast-feeding How should I use this medicine? This medicine is for infusion into a vein. It is administered in a hospital or clinic by a specially trained health care professional. A special MedGuide will be given to you by the pharmacist with each prescription and refill. Be sure to read this information carefully each time. Talk to your pediatrician regarding the use of this medicine in children. This medicine is not approved for use in children. Overdosage: If you think you have taken too much of this medicine contact a poison control center or emergency room at once. NOTE: This medicine is only for you. Do not share this medicine with others. What if I miss a dose? It is important not to miss a dose. Call your doctor or health care professional if you are unable to keep an appointment. What may interact with this medicine? -cisplatin -other medicines for arthritis like disease modifying antirheumatic drugs or tumor necrosis factor inhibitors -live virus vaccines This list may not describe all possible interactions. Give your health care provider a list of all the medicines, herbs, non-prescription drugs, or dietary supplements you use. Also tell them if you smoke, drink alcohol, or use illegal drugs. Some items may interact with your medicine. What should I watch for while using this medicine? Your condition will be monitored carefully while you are receiving this medicine. You may need blood work done while you are taking this medicine. This medicine can cause serious allergic reactions. To reduce your risk you may need to take medicine before treatment with this medicine. Take   your medicine as directed. In some patients, this medicine may cause a serious brain infection that may cause death. If you have any problems seeing,  thinking, speaking, walking, or standing, tell your doctor right away. If you cannot reach your doctor, urgently seek other source of medical care. Call your doctor or health care professional for advice if you get a fever, chills or sore throat, or other symptoms of a cold or flu. Do not treat yourself. This drug decreases your body's ability to fight infections. Try to avoid being around people who are sick. Do not become pregnant while taking this medicine or for 12 months after stopping it. Women should inform their doctor if they wish to become pregnant or think they might be pregnant. There is a potential for serious side effects to an unborn child. Talk to your health care professional or pharmacist for more information. What side effects may I notice from receiving this medicine? Side effects that you should report to your doctor or health care professional as soon as possible: -breathing problems -chest pain -dizziness or feeling faint -fast, irregular heartbeat -low blood counts - this medicine may decrease the number of white blood cells, red blood cells and platelets. You may be at increased risk for infections and bleeding. -mouth sores -redness, blistering, peeling or loosening of the skin, including inside the mouth (this can be added for any serious or exfoliative rash that could lead to hospitalization) -signs of infection - fever or chills, cough, sore throat, pain or difficulty passing urine -signs and symptoms of kidney injury like trouble passing urine or change in the amount of urine -signs and symptoms of liver injury like dark yellow or brown urine; general ill feeling or flu-like symptoms; light-colored stools; loss of appetite; nausea; right upper belly pain; unusually weak or tired; yellowing of the eyes or skin -stomach pain -vomiting Side effects that usually do not require medical attention (report to your doctor or health care professional if they continue or are  bothersome): -headache -joint pain -muscle cramps or muscle pain This list may not describe all possible side effects. Call your doctor for medical advice about side effects. You may report side effects to FDA at 1-800-FDA-1088. Where should I keep my medicine? This drug is given in a hospital or clinic and will not be stored at home. NOTE: This sheet is a summary. It may not cover all possible information. If you have questions about this medicine, talk to your doctor, pharmacist, or health care provider.  2018 Elsevier/Gold Standard (2015-08-12 15:28:09)  

## 2016-06-27 NOTE — Progress Notes (Signed)
At completion of Rituxan infusion, patient c/o chills. Denies any other symptoms. Noted patient to be slightly tachycardic. Dr. [name] notified and came to infusion room to evaluate patient. Patient medicated with complete resolution of rigors within 2-3 minutes of Demerol. Escorted patient to restroom via wheelchair. While in restroom, rigors returned and patient experienced nausea with minimal emesis. Took patient back to treatment chair via wheelchair. Dr. [name] notified of change in condition. Advised to continue monitoring. Later noted increased temp. Dr. [name] aware and orders received. Medicated per orders. Tolerated well and no further issues noted. Patient remained stable, temp improved, and rigors resolved. Patient discharged home with daughters [name] and Kieth Brightly). Advised patient to take Tylenol 650mg  po every 6 hours for the next 24 hours per Dr. [name]. Also encouraged patient to take temp frequently and call office with persistent or worsening symptoms. Verbalized understanding.

## 2016-06-28 LAB — HEPATITIS B SURFACE ANTIBODY,QUALITATIVE: Hep B Surface Ab, Qual: NONREACTIVE

## 2016-06-28 LAB — HEPATITIS B CORE ANTIBODY, TOTAL: Hep B Core Ab, Tot: NEGATIVE

## 2016-06-28 LAB — PHOSPHORUS: PHOSPHORUS: 2.5 mg/dL (ref 2.5–4.5)

## 2016-06-28 LAB — HEPATITIS B SURFACE ANTIGEN: HBsAg Screen: NEGATIVE

## 2016-06-28 LAB — HEPATITIS C ANTIBODY: HEP C VIRUS AB: 0.1 {s_co_ratio} (ref 0.0–0.9)

## 2016-06-30 LAB — CHROMOSOME ANALYSIS, BONE MARROW

## 2016-07-01 ENCOUNTER — Other Ambulatory Visit: Payer: Self-pay

## 2016-07-04 ENCOUNTER — Telehealth: Payer: Self-pay | Admitting: Hematology

## 2016-07-04 ENCOUNTER — Encounter: Payer: Self-pay | Admitting: Hematology

## 2016-07-04 ENCOUNTER — Ambulatory Visit (HOSPITAL_BASED_OUTPATIENT_CLINIC_OR_DEPARTMENT_OTHER): Payer: Medicare Other

## 2016-07-04 ENCOUNTER — Ambulatory Visit (HOSPITAL_BASED_OUTPATIENT_CLINIC_OR_DEPARTMENT_OTHER): Payer: Medicare Other | Admitting: Hematology

## 2016-07-04 ENCOUNTER — Other Ambulatory Visit (HOSPITAL_BASED_OUTPATIENT_CLINIC_OR_DEPARTMENT_OTHER): Payer: Medicare Other

## 2016-07-04 ENCOUNTER — Other Ambulatory Visit: Payer: Self-pay | Admitting: Hematology

## 2016-07-04 VITALS — BP 136/64 | HR 91 | Resp 18 | Ht 63.0 in | Wt 120.3 lb

## 2016-07-04 VITALS — BP 153/78 | HR 74 | Temp 98.1°F | Resp 17

## 2016-07-04 DIAGNOSIS — Z5112 Encounter for antineoplastic immunotherapy: Secondary | ICD-10-CM | POA: Diagnosis not present

## 2016-07-04 DIAGNOSIS — I428 Other cardiomyopathies: Secondary | ICD-10-CM | POA: Diagnosis not present

## 2016-07-04 DIAGNOSIS — C8307 Small cell B-cell lymphoma, spleen: Secondary | ICD-10-CM

## 2016-07-04 DIAGNOSIS — D696 Thrombocytopenia, unspecified: Secondary | ICD-10-CM | POA: Diagnosis not present

## 2016-07-04 DIAGNOSIS — D649 Anemia, unspecified: Secondary | ICD-10-CM

## 2016-07-04 DIAGNOSIS — E039 Hypothyroidism, unspecified: Secondary | ICD-10-CM

## 2016-07-04 DIAGNOSIS — D72820 Lymphocytosis (symptomatic): Secondary | ICD-10-CM

## 2016-07-04 LAB — CBC & DIFF AND RETIC
BASO%: 0.1 % (ref 0.0–2.0)
Basophils Absolute: 0 10*3/uL (ref 0.0–0.1)
EOS%: 0.2 % (ref 0.0–7.0)
Eosinophils Absolute: 0 10*3/uL (ref 0.0–0.5)
HEMATOCRIT: 28.7 % — AB (ref 34.8–46.6)
HGB: 9.6 g/dL — ABNORMAL LOW (ref 11.6–15.9)
Immature Retic Fract: 4.6 % (ref 1.60–10.00)
LYMPH%: 56.7 % — ABNORMAL HIGH (ref 14.0–49.7)
MCH: 27.9 pg (ref 25.1–34.0)
MCHC: 33.4 g/dL (ref 31.5–36.0)
MCV: 83.4 fL (ref 79.5–101.0)
MONO#: 1 10*3/uL — ABNORMAL HIGH (ref 0.1–0.9)
MONO%: 10.9 % (ref 0.0–14.0)
NEUT#: 2.8 10*3/uL (ref 1.5–6.5)
NEUT%: 32.1 % — AB (ref 38.4–76.8)
NRBC: 0 % (ref 0–0)
Platelets: 128 10*3/uL — ABNORMAL LOW (ref 145–400)
RBC: 3.44 10*6/uL — ABNORMAL LOW (ref 3.70–5.45)
RDW: 17 % — AB (ref 11.2–14.5)
RETIC %: 2.5 % — AB (ref 0.70–2.10)
Retic Ct Abs: 86 10*3/uL (ref 33.70–90.70)
WBC: 8.8 10*3/uL (ref 3.9–10.3)
lymph#: 5 10*3/uL — ABNORMAL HIGH (ref 0.9–3.3)

## 2016-07-04 LAB — COMPREHENSIVE METABOLIC PANEL
ALT: 11 U/L (ref 0–55)
AST: 27 U/L (ref 5–34)
Albumin: 2.9 g/dL — ABNORMAL LOW (ref 3.5–5.0)
Alkaline Phosphatase: 148 U/L (ref 40–150)
Anion Gap: 6 mEq/L (ref 3–11)
BUN: 12.5 mg/dL (ref 7.0–26.0)
CHLORIDE: 98 meq/L (ref 98–109)
CO2: 27 mEq/L (ref 22–29)
Calcium: 8.6 mg/dL (ref 8.4–10.4)
Creatinine: 0.7 mg/dL (ref 0.6–1.1)
EGFR: 74 mL/min/{1.73_m2} — AB (ref >90)
Glucose: 112 mg/dl (ref 70–140)
POTASSIUM: 4 meq/L (ref 3.5–5.1)
Sodium: 132 mEq/L — ABNORMAL LOW (ref 136–145)
Total Bilirubin: 0.57 mg/dL (ref 0.20–1.20)
Total Protein: 6.7 g/dL (ref 6.4–8.3)

## 2016-07-04 LAB — TECHNOLOGIST REVIEW

## 2016-07-04 MED ORDER — ACETAMINOPHEN 500 MG PO TABS
ORAL_TABLET | ORAL | Status: AC
  Filled 2016-07-04: qty 1

## 2016-07-04 MED ORDER — DIPHENHYDRAMINE HCL 25 MG PO CAPS
ORAL_CAPSULE | ORAL | Status: AC
  Filled 2016-07-04: qty 2

## 2016-07-04 MED ORDER — SODIUM CHLORIDE 0.9 % IV SOLN
375.0000 mg/m2 | Freq: Once | INTRAVENOUS | Status: AC
  Administered 2016-07-04: 600 mg via INTRAVENOUS
  Filled 2016-07-04: qty 50

## 2016-07-04 MED ORDER — DIPHENHYDRAMINE HCL 25 MG PO CAPS
50.0000 mg | ORAL_CAPSULE | Freq: Once | ORAL | Status: AC
  Administered 2016-07-04: 50 mg via ORAL

## 2016-07-04 MED ORDER — FAMOTIDINE 20 MG PO TABS
ORAL_TABLET | ORAL | Status: AC
  Filled 2016-07-04: qty 1

## 2016-07-04 MED ORDER — SODIUM CHLORIDE 0.9 % IV SOLN
Freq: Once | INTRAVENOUS | Status: AC
  Administered 2016-07-04: 14:00:00 via INTRAVENOUS

## 2016-07-04 MED ORDER — MEPERIDINE HCL 25 MG/ML IJ SOLN
12.5000 mg | Freq: Once | INTRAMUSCULAR | Status: AC
  Administered 2016-07-04: 12.5 mg via INTRAVENOUS

## 2016-07-04 MED ORDER — SODIUM CHLORIDE 0.9 % IV SOLN
12.0000 mg | Freq: Once | INTRAVENOUS | Status: AC
  Administered 2016-07-04: 12 mg via INTRAVENOUS
  Filled 2016-07-04: qty 1.2

## 2016-07-04 MED ORDER — MEPERIDINE HCL 25 MG/ML IJ SOLN
INTRAMUSCULAR | Status: AC
  Filled 2016-07-04: qty 1

## 2016-07-04 MED ORDER — ACETAMINOPHEN 500 MG PO TABS
ORAL_TABLET | ORAL | Status: AC
Start: 2016-07-04 — End: ?
  Filled 2016-07-04: qty 2

## 2016-07-04 MED ORDER — ACETAMINOPHEN 500 MG PO TABS
1000.0000 mg | ORAL_TABLET | Freq: Once | ORAL | Status: AC
  Administered 2016-07-04: 1000 mg via ORAL

## 2016-07-04 MED ORDER — FAMOTIDINE 20 MG PO TABS
20.0000 mg | ORAL_TABLET | Freq: Once | ORAL | Status: AC
  Administered 2016-07-04: 20 mg via ORAL

## 2016-07-04 NOTE — Patient Instructions (Signed)
[facility] Discharge Instructions for Patients Receiving Chemotherapy  Today you received the following chemotherapy agents: Rituxan   To help prevent nausea and vomiting after your treatment, we encourage you to take your nausea medication as directed.    If you develop nausea and vomiting that is not controlled by your nausea medication, call the clinic.   BELOW ARE SYMPTOMS THAT SHOULD BE REPORTED IMMEDIATELY:  *FEVER GREATER THAN 100.5 F  *CHILLS WITH OR WITHOUT FEVER  NAUSEA AND VOMITING THAT IS NOT CONTROLLED WITH YOUR NAUSEA MEDICATION  *UNUSUAL SHORTNESS OF BREATH  *UNUSUAL BRUISING OR BLEEDING  TENDERNESS IN MOUTH AND THROAT WITH OR WITHOUT PRESENCE OF ULCERS  *URINARY PROBLEMS  *BOWEL PROBLEMS  UNUSUAL RASH Items with * indicate a potential emergency and should be followed up as soon as possible.  Feel free to call the clinic you have any questions or concerns. The clinic phone number is [phone].  Please show the CHEMO ALERT CARD at check-in to the Emergency Department and triage nurse.   

## 2016-07-04 NOTE — Telephone Encounter (Signed)
Scheduled additional appt per 6/18 los - patient is aware to listen to voicemail. Left message with appt date and time.

## 2016-07-04 NOTE — Progress Notes (Signed)
[facility]    HEMATOLOGY/ONCOLOGY CONSULTATION NOTE  Date of Service: .07/04/2016  Patient Care Team: [name], MD as PCP - General (Family Medicine)  CHIEF COMPLAINTS/PURPOSE OF CONSULTATION:  Anemia Lymphocytosis Thrombocytopenia  HISTORY OF PRESENTING ILLNESS:   Kathleen Arias is a wonderful 83 y.o. female who has been referred to [place] by Dr .Redmond Pulling, Jama Flavors, MD for evaluation and management of anemia, leukocytosis and thrombocytopenia.  Patient is a very pleasant female who is here with her daughter. She has a history of hypertension, chronic systolic CHF [ejection fraction of 25-35% based on recent cardiac catheterization on 01/13/2016, echo done showed ejection fraction of 40-45%, nonischemic cardiomyopathy], iron deficiency,  Patient presented to his primary care physician with some dyspnea on exertion which is primarily related to her systolic CHF but she was also noted to have recent blood counts with her primary care physician on 03/15/2016 which showed anemia with a hemoglobin of 9.7 and a hematocrit of 79, platelet count of 137k , WBC count of 11.7k with 7.6k lymphocytes.  Iron studies were done which showed a ferritin of 84 and an iron saturation of 9%. She has been on oral iron replacement by her primary care physician and notes significant constipation and GI intolerance. She reports that she had a FOBT in 12/2015  which was negative.   Patient notes significant fatigue and dyspnea on exertion. Notes weight loss of about 15 pounds since September 2017. Weight is down from 142 to 124lbs.   Patient denies any enlarged lymph nodes, no night sweats.  INTERVAL HISTORY  Mrs. Hammock is here for follow-up along with her family for a toxicity screen and follow-up prior to her second weekly dose of Rituxan for splenic marginal zone lymphoma. She had some rigors and fever after completion of her first dose of Rituxan. Rigors resolved with low-dose Demerol and the fevers resolved with  antipyretics and steroids. Patient notes that she has been eating much better and had a good week. Platelet counts are trending up and she has had some decrease in her lymphocytosis. No other acute new symptoms. We discussed her PET findings of an abnormal looking gallbladder. Patient is disinclined to pursue ultrasound or any additional workup or intervention regarding this currently.  MEDICAL HISTORY:  Past Medical History:  Diagnosis Date  . Arthritis   . Hypertension   . Thyroid disease   Chronic systolic CHF Iron deficiency   SURGICAL HISTORY: Past Surgical History:  Procedure Laterality Date  . CARDIAC CATHETERIZATION N/A 01/07/2016   Procedure: Left Heart Cath and Coronary Angiography;  Surgeon: Jettie Booze, MD;  Location: [facility];  Service: Cardiovascular;  Laterality: N/A;  . CATARACT EXTRACTION, BILATERAL    . HIP SURGERY      SOCIAL HISTORY: Social History   Social History  . Marital status: Single    Spouse name: N/A  . Number of children: N/A  . Years of education: N/A   Occupational History  . Not on file.   Social History Main Topics  . Smoking status: Former Research scientist (life sciences)  . Smokeless tobacco: Never Used  . Alcohol use No  . Drug use: No  . Sexual activity: Not on file   Other Topics Concern  . Not on file   Social History Narrative  . No narrative on file    FAMILY HISTORY: Family History  Problem Relation Age of Onset  . Heart disease Mother     ALLERGIES:  has No Known Allergies.  MEDICATIONS:  Current Outpatient Prescriptions  Medication Sig Dispense Refill  . aspirin EC 325 MG tablet Take 325 mg by mouth every other day.    . B COMPLEX-C-CALCIUM PO Take 1 tablet by mouth daily.    . carvedilol (COREG) 6.25 MG tablet Take 6.25 mg by mouth 2 (two) times daily with a meal.     . hydrochlorothiazide (MICROZIDE) 12.5 MG capsule Take 12.5 mg by mouth daily.    Marland Kitchen levothyroxine (SYNTHROID, LEVOTHROID) 112 MCG tablet Take 112  mcg by mouth See admin instructions. Sun/Tues/Thurs/Sat    . levothyroxine (SYNTHROID, LEVOTHROID) 125 MCG tablet Take 125 mcg by mouth every Monday, Wednesday, and Friday.     . losartan (COZAAR) 25 MG tablet Take 12.5 mg by mouth daily.    Marland Kitchen POTASSIUM PO Take 99 mg by mouth 2 (two) times a week.    . predniSONE (DELTASONE) 20 MG tablet Take 1 tablet (20 mg total) by mouth daily with breakfast. 10 tablet 0  . vitamin E 1000 UNIT capsule Take 1,000 Units by mouth every other day.     No current facility-administered medications for this visit.     REVIEW OF SYSTEMS:    10 Point review of Systems was done is negative except as noted above.  PHYSICAL EXAMINATION: ECOG PERFORMANCE STATUS: 2 - Symptomatic, <50% confined to bed  Vital Signs reviewed in EPIC. GENERAL:alert, in no acute distress and comfortable SKIN: no acute rashes, no significant lesions EYES: conjunctiva are pink and non-injected, sclera anicteric OROPHARYNX: MMM, no exudates, no oropharyngeal erythema or ulceration NECK: supple, Mild JVD LYMPH:  no palpable lymphadenopathy in the cervical, axillary or inguinal regions LUNGS: clear to auscultation b/l with normal respiratory effort HEART: regular rate & rhythm ABDOMEN:  normoactive bowel sounds , non tender, not distended. no allergic palpable hepatosplenomegaly  Extremity: 1+  pedal edema Bilaterally  PSYCH: alert & oriented x 3 with fluent speech NEURO: no focal motor/sensory deficits  LABORATORY DATA:  I have reviewed the data as listed  . CBC Latest Ref Rng & Units 07/04/2016 06/27/2016 06/15/2016  WBC 3.9 - 10.3 10e3/uL 8.8 18.6(H) 14.6(H)  Hemoglobin 11.6 - 15.9 g/dL 9.6(L) 10.3(L) 10.4(L)  Hematocrit 34.8 - 46.6 % 28.7(L) 30.5(L) 31.5(L)  Platelets 145 - 400 10e3/uL 128(L) 110(L) 93(L)   . CBC    Component Value Date/Time   WBC 8.8 07/04/2016 1140   WBC 14.9 (H) 06/09/2016 0938   RBC 3.44 (L) 07/04/2016 1140   RBC 3.54 (L) 06/09/2016 0938   HGB 9.6  (L) 07/04/2016 1140   HCT 28.7 (L) 07/04/2016 1140   PLT 128 (L) 07/04/2016 1140   PLT 118 (L) 05/04/2016 1435   MCV 83.4 07/04/2016 1140   MCH 27.9 07/04/2016 1140   MCH 28.0 06/09/2016 0938   MCHC 33.4 07/04/2016 1140   MCHC 33.6 06/09/2016 0938   RDW 17.0 (H) 07/04/2016 1140   LYMPHSABS 5.0 (H) 07/04/2016 1140   MONOABS 1.0 (H) 07/04/2016 1140   EOSABS 0.0 07/04/2016 1140   EOSABS 0.1 05/04/2016 1435   BASOSABS 0.0 07/04/2016 1140    . CMP Latest Ref Rng & Units 07/04/2016 06/27/2016 06/15/2016  Glucose 70 - 140 mg/dl 112 94 87  BUN 7.0 - 26.0 mg/dL 12.5 14.4 11.7  Creatinine 0.6 - 1.1 mg/dL 0.7 0.7 0.7  Sodium 136 - 145 mEq/L 132(L) 135(L) 134(L)  Potassium 3.5 - 5.1 mEq/L 4.0 4.3 3.8  Chloride 96 - 106 mmol/L - - -  CO2 22 - 29 mEq/L 27  27 28  Calcium 8.4 - 10.4 mg/dL 8.6 9.0 8.7  Total Protein 6.4 - 8.3 g/dL 6.7 7.2 7.4  Total Bilirubin 0.20 - 1.20 mg/dL 0.57 0.69 0.84  Alkaline Phos 40 - 150 U/L 148 85 102  AST 5 - 34 U/L 27 30 34  ALT 0 - 55 U/L _0 . Lab Results  Component Value Date   IRON 66 06/15/2016   TIBC 354 06/15/2016   IRONPCTSAT 19 (L) 06/15/2016   (Iron and TIBC)  Lab Results  Component Value Date   FERRITIN 266 06/15/2016    Lab Results  Component Value Date   LDH 394 (H) 06/27/2016   Component     Latest Ref Rng & Units 05/16/2016  Sed Rate     0 - 40 mm/hr 22  Erythropoietin     2.6 - 18.5 mIU/mL 27.2 (H)  LDH     125 - 245 U/L 573 (H)  Haptoglobin     34 - 200 mg/dL 53           RADIOGRAPHIC STUDIES: I have personally reviewed the radiological images as listed and agreed with the findings in the report. Nm Pet Image Initial (pi) Skull Base To Thigh  Result Date: 06/06/2016 CLINICAL DATA:  Initial treatment strategy for non-Hodgkin's lymphoma. EXAM: NUCLEAR MEDICINE PET SKULL BASE TO THIGH TECHNIQUE: 7.7 mCi F-18 FDG was injected intravenously. Full-ring PET imaging was performed from the skull base to thigh after the  radiotracer. CT data was obtained and used for attenuation correction and anatomic localization. FASTING BLOOD GLUCOSE:  Value: 93 mg/dl COMPARISON:  None. FINDINGS: NECK No hypermetabolic cervical lymph nodes are identified.There are no lesions of the pharyngeal mucosal space. There is mild diffuse thyroid activity, nonspecific. CHEST There are no hypermetabolic mediastinal, hilar or axillary lymph nodes. There is no suspicious pulmonary activity. The lungs are clear. Mild atherosclerosis of the aorta, great vessels and coronary arteries noted. ABDOMEN/PELVIS The spleen is markedly enlarged, measuring up to 15.9 x 11.1 cm transverse. It extends into the false pelvis and demonstrates moderate, fairly homogeneous hypermetabolic activity with an SUV max of approximately 4.7. There is an area of probable necrosis along the anteromedial aspect of the spleen. There is no hypermetabolic activity within the liver, adrenal glands or kidneys. There are no hypermetabolic abdominopelvic lymph nodes. There is a large calcified gallstone versus gallbladder wall calcification, sigmoid diverticulosis, aortoiliac atherosclerosis and medial displacement of the left kidney by the enlarged spleen. SKELETON There is no hypermetabolic activity to suggest osseous metastatic disease. There is multilevel spondylosis. IMPRESSION: 1. Marked splenomegaly with associated hypermetabolic activity, likely related to the patient's lymphoma. 2. No other hypermetabolic activity within the solid parenchymal organs, lymph nodes or bone marrow. 3. Incidental findings including atherosclerosis, large calcified gallstone versus porcelain gallbladder and distal colonic diverticulosis. Electronically Signed   By: Richardean Sale M.D.   On: 06/06/2016 16:21   Ct Biopsy  Result Date: 06/09/2016 INDICATION: Lymphoma EXAM: CT BIOPSY; CT BONE MARROW BIOPSY AND ASPIRATION MEDICATIONS: None. ANESTHESIA/SEDATION: Fentanyl 50 mcg IV; Versed 1 mg IV Moderate  Sedation Time:  13 The patient was continuously monitored during the procedure by the interventional radiology nurse under my direct supervision. FLUOROSCOPY TIME:  None COMPLICATIONS: None immediate. PROCEDURE: Informed written consent was obtained from the patient after a thorough discussion of the procedural risks, benefits and alternatives. All questions were addressed. Maximal Sterile Barrier Technique was utilized including caps, mask, sterile gowns, sterile gloves, sterile  drape, hand hygiene and skin antiseptic. A timeout was performed prior to the initiation of the procedure. Under CT guidance, an 11 gauge needle was inserted into the right iliac bone. Aspirates and a core were obtain. Post biopsy images demonstrate no hemorrhage. Patient tolerated the procedure well without complication. Vital sign monitoring by nursing staff during the procedure will continue as patient is in the special procedures unit for post procedure observation. FINDINGS: The images document guide needle placement within the right iliac bone. Post biopsy images demonstrate no hemorrhage. IMPRESSION: Successful right iliac bone marrow aspirate and core. Electronically Signed   By: Marybelle Killings M.D.   On: 06/09/2016 15:28   Ct Bone Marrow Biopsy & Aspiration  Result Date: 06/09/2016 INDICATION: Lymphoma EXAM: CT BIOPSY; CT BONE MARROW BIOPSY AND ASPIRATION MEDICATIONS: None. ANESTHESIA/SEDATION: Fentanyl 50 mcg IV; Versed 1 mg IV Moderate Sedation Time:  13 The patient was continuously monitored during the procedure by the interventional radiology nurse under my direct supervision. FLUOROSCOPY TIME:  None COMPLICATIONS: None immediate. PROCEDURE: Informed written consent was obtained from the patient after a thorough discussion of the procedural risks, benefits and alternatives. All questions were addressed. Maximal Sterile Barrier Technique was utilized including caps, mask, sterile gowns, sterile gloves, sterile drape, hand  hygiene and skin antiseptic. A timeout was performed prior to the initiation of the procedure. Under CT guidance, an 11 gauge needle was inserted into the right iliac bone. Aspirates and a core were obtain. Post biopsy images demonstrate no hemorrhage. Patient tolerated the procedure well without complication. Vital sign monitoring by nursing staff during the procedure will continue as patient is in the special procedures unit for post procedure observation. FINDINGS: The images document guide needle placement within the right iliac bone. Post biopsy images demonstrate no hemorrhage. IMPRESSION: Successful right iliac bone marrow aspirate and core. Electronically Signed   By: Marybelle Killings M.D.   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 Bone marrow involvement as noted above with anemia, lymphocytosis, thrombocytopenia. Patient  Has significant constitutional symptoms including fatigue, some low-grade fevers and night sweats. Also has abdominal discomfort related to her splenomegaly and anorexia. Serum kappa lambda free light chain ratio abnormally elevated. 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 Thrombocytopenia improved PLT up from 80-90k to 128k  3) hypothyroidism 4) nonischemic systolic cardiomyopathy - ejection fraction 40-45% on echo. Cardiac catheterization December 2017- nonobstructive coronary artery disease.  5) Rigors and fever related to Rituxan. PLAN -Patient has no fevers or chills at this time he has been eating well. No other prohibitive toxicities from treatment currently. -I have adjusted supportive medications for her Rituxan. She will get higher dose of Tylenol, Benadryl, famotidine, dexamethasone as premedications. -Given her previous rigors we shall  premedicate her with Demerol 12.5 mg IV prophylactically as well -We'll need monitoring for hypersensitivity reactions .  Continue Rituxan as per schedule weekly x 4 doses Weekly labs prior to Rituxan RTC with Dr [name] in 1 week with next dose of Rituxan with labs  All of the patients and her accompanying family's questions were answered with apparent satisfaction. The patient knows to call the clinic with any problems, questions or concerns.  I spent 20 minutes counseling the patient face to face. The total time spent in the  appointment was 25 minutes and more than 50% was on counseling and direct patient cares.    Sullivan Lone MD [place] AAHIVMS [facility] Hematology/Oncology Physician [facility]  (Office):       [phone] (Work cell):  [phone] (Fax):           [phone]

## 2016-07-04 NOTE — Telephone Encounter (Signed)
Scheduled appt per 6/18 los - Gave patient AVS and calender - unable to schedule next cycle per LOS due to availability - patient aware will let patient know when appt is scheduled.

## 2016-07-13 ENCOUNTER — Ambulatory Visit (HOSPITAL_BASED_OUTPATIENT_CLINIC_OR_DEPARTMENT_OTHER): Payer: Medicare Other | Admitting: Hematology

## 2016-07-13 ENCOUNTER — Ambulatory Visit (HOSPITAL_BASED_OUTPATIENT_CLINIC_OR_DEPARTMENT_OTHER): Payer: Medicare Other

## 2016-07-13 ENCOUNTER — Other Ambulatory Visit (HOSPITAL_BASED_OUTPATIENT_CLINIC_OR_DEPARTMENT_OTHER): Payer: Medicare Other

## 2016-07-13 VITALS — BP 144/69 | HR 85 | Temp 97.8°F | Resp 20

## 2016-07-13 DIAGNOSIS — D696 Thrombocytopenia, unspecified: Secondary | ICD-10-CM

## 2016-07-13 DIAGNOSIS — D649 Anemia, unspecified: Secondary | ICD-10-CM | POA: Diagnosis not present

## 2016-07-13 DIAGNOSIS — Z5112 Encounter for antineoplastic immunotherapy: Secondary | ICD-10-CM | POA: Diagnosis not present

## 2016-07-13 DIAGNOSIS — C8307 Small cell B-cell lymphoma, spleen: Secondary | ICD-10-CM | POA: Diagnosis not present

## 2016-07-13 DIAGNOSIS — D72820 Lymphocytosis (symptomatic): Secondary | ICD-10-CM

## 2016-07-13 DIAGNOSIS — E039 Hypothyroidism, unspecified: Secondary | ICD-10-CM

## 2016-07-13 LAB — CBC WITH DIFFERENTIAL/PLATELET
BASO%: 1.1 % (ref 0.0–2.0)
BASOS ABS: 0.1 10*3/uL (ref 0.0–0.1)
EOS ABS: 0 10*3/uL (ref 0.0–0.5)
EOS%: 0.4 % (ref 0.0–7.0)
HCT: 31.5 % — ABNORMAL LOW (ref 34.8–46.6)
HGB: 11.2 g/dL — ABNORMAL LOW (ref 11.6–15.9)
LYMPH%: 34.7 % (ref 14.0–49.7)
MCH: 28.7 pg (ref 25.1–34.0)
MCHC: 35.5 g/dL (ref 31.5–36.0)
MCV: 80.8 fL (ref 79.5–101.0)
MONO#: 0.4 10*3/uL (ref 0.1–0.9)
MONO%: 6.3 % (ref 0.0–14.0)
NEUT#: 3.6 10*3/uL (ref 1.5–6.5)
NEUT%: 57.5 % (ref 38.4–76.8)
Platelets: 187 10*3/uL (ref 145–400)
RBC: 3.89 10*6/uL (ref 3.70–5.45)
RDW: 17.1 % — ABNORMAL HIGH (ref 11.2–14.5)
WBC: 6.3 10*3/uL (ref 3.9–10.3)
lymph#: 2.2 10*3/uL (ref 0.9–3.3)

## 2016-07-13 LAB — COMPREHENSIVE METABOLIC PANEL
ALT: 13 U/L (ref 0–55)
ANION GAP: 10 meq/L (ref 3–11)
AST: 25 U/L (ref 5–34)
Albumin: 3.3 g/dL — ABNORMAL LOW (ref 3.5–5.0)
Alkaline Phosphatase: 121 U/L (ref 40–150)
BILIRUBIN TOTAL: 0.65 mg/dL (ref 0.20–1.20)
BUN: 15.1 mg/dL (ref 7.0–26.0)
CALCIUM: 9.2 mg/dL (ref 8.4–10.4)
CO2: 27 meq/L (ref 22–29)
CREATININE: 0.8 mg/dL (ref 0.6–1.1)
Chloride: 98 mEq/L (ref 98–109)
EGFR: 69 mL/min/{1.73_m2} — ABNORMAL LOW (ref >90)
Glucose: 102 mg/dl (ref 70–140)
Potassium: 4 mEq/L (ref 3.5–5.1)
Sodium: 135 mEq/L — ABNORMAL LOW (ref 136–145)
TOTAL PROTEIN: 7.1 g/dL (ref 6.4–8.3)

## 2016-07-13 MED ORDER — FAMOTIDINE 20 MG PO TABS
ORAL_TABLET | ORAL | Status: AC
  Filled 2016-07-13: qty 1

## 2016-07-13 MED ORDER — SODIUM CHLORIDE 0.9 % IV SOLN
12.0000 mg | Freq: Once | INTRAVENOUS | Status: AC
  Administered 2016-07-13: 12 mg via INTRAVENOUS
  Filled 2016-07-13: qty 1.2

## 2016-07-13 MED ORDER — MEPERIDINE HCL 25 MG/ML IJ SOLN
INTRAMUSCULAR | Status: AC
  Filled 2016-07-13: qty 1

## 2016-07-13 MED ORDER — MEPERIDINE HCL 25 MG/ML IJ SOLN
12.5000 mg | Freq: Once | INTRAMUSCULAR | Status: AC
  Administered 2016-07-13: 12.5 mg via INTRAVENOUS

## 2016-07-13 MED ORDER — ACETAMINOPHEN 500 MG PO TABS
ORAL_TABLET | ORAL | Status: AC
  Filled 2016-07-13: qty 2

## 2016-07-13 MED ORDER — DIPHENHYDRAMINE HCL 25 MG PO CAPS
ORAL_CAPSULE | ORAL | Status: AC
  Filled 2016-07-13: qty 2

## 2016-07-13 MED ORDER — ACETAMINOPHEN 500 MG PO TABS
1000.0000 mg | ORAL_TABLET | Freq: Once | ORAL | Status: AC
  Administered 2016-07-13: 1000 mg via ORAL

## 2016-07-13 MED ORDER — FAMOTIDINE 20 MG PO TABS
20.0000 mg | ORAL_TABLET | Freq: Once | ORAL | Status: AC
  Administered 2016-07-13: 20 mg via ORAL

## 2016-07-13 MED ORDER — DIPHENHYDRAMINE HCL 25 MG PO CAPS
50.0000 mg | ORAL_CAPSULE | Freq: Once | ORAL | Status: AC
  Administered 2016-07-13: 50 mg via ORAL

## 2016-07-13 MED ORDER — SODIUM CHLORIDE 0.9 % IV SOLN
Freq: Once | INTRAVENOUS | Status: AC
  Administered 2016-07-13: 09:00:00 via INTRAVENOUS

## 2016-07-13 MED ORDER — SODIUM CHLORIDE 0.9 % IV SOLN
375.0000 mg/m2 | Freq: Once | INTRAVENOUS | Status: AC
  Administered 2016-07-13: 600 mg via INTRAVENOUS
  Filled 2016-07-13: qty 50

## 2016-07-13 NOTE — Patient Instructions (Signed)
Cancer Center Discharge Instructions for Patients Receiving Chemotherapy  Today you received the following chemotherapy agents Rituxan  To help prevent nausea and vomiting after your treatment, we encourage you to take your nausea medication    If you develop nausea and vomiting that is not controlled by your nausea medication, call the clinic.   BELOW ARE SYMPTOMS THAT SHOULD BE REPORTED IMMEDIATELY:  *FEVER GREATER THAN 100.5 F  *CHILLS WITH OR WITHOUT FEVER  NAUSEA AND VOMITING THAT IS NOT CONTROLLED WITH YOUR NAUSEA MEDICATION  *UNUSUAL SHORTNESS OF BREATH  *UNUSUAL BRUISING OR BLEEDING  TENDERNESS IN MOUTH AND THROAT WITH OR WITHOUT PRESENCE OF ULCERS  *URINARY PROBLEMS  *BOWEL PROBLEMS  UNUSUAL RASH Items with * indicate a potential emergency and should be followed up as soon as possible.  Feel free to call the clinic you have any questions or concerns. The clinic phone number is [phone].  Please show the CHEMO ALERT CARD at check-in to the Emergency Department and triage nurse.   

## 2016-07-14 LAB — RETICULOCYTES: RETICULOCYTE COUNT: 1.9 % (ref 0.6–2.6)

## 2016-07-17 NOTE — Progress Notes (Signed)
Marland Kitchen    HEMATOLOGY/ONCOLOGY CONSULTATION NOTE  Date of Service: .07/13/2016  Patient Care Team: [name], MD as PCP - General (Family Medicine)  CHIEF COMPLAINTS/PURPOSE OF CONSULTATION:  F/u for splenic marginal zone lymphoma.  HISTORY OF PRESENTING ILLNESS:   Kathleen Arias is a wonderful 83 y.o. female who has been referred to [place] by Dr .Redmond Pulling, Jama Flavors, MD for evaluation and management of anemia, leukocytosis and thrombocytopenia.  Patient is a very pleasant female who is here with her daughter. She has a history of hypertension, chronic systolic CHF [ejection fraction of 25-35% based on recent cardiac catheterization on 01/13/2016, echo done showed ejection fraction of 40-45%, nonischemic cardiomyopathy], iron deficiency,  Patient presented to his primary care physician with some dyspnea on exertion which is primarily related to her systolic CHF but she was also noted to have recent blood counts with her primary care physician on 03/15/2016 which showed anemia with a hemoglobin of 9.7 and a hematocrit of 79, platelet count of 137k , WBC count of 11.7k with 7.6k lymphocytes.  Iron studies were done which showed a ferritin of 84 and an iron saturation of 9%. She has been on oral iron replacement by her primary care physician and notes significant constipation and GI intolerance. She reports that she had a FOBT in 12/2015  which was negative.   Patient notes significant fatigue and dyspnea on exertion. Notes weight loss of about 15 pounds since September 2017. Weight is down from 142 to 124lbs.   Patient denies any enlarged lymph nodes, no night sweats.  INTERVAL HISTORY  Kathleen Arias is here for follow-up along with her daughter for her 3rd dose of Rituxan . She notes no rigors or fevers or other toxicities with her 2nd dose of Rituxan and overall feel much better. Blood counts improving. No other acute new symptoms.  MEDICAL HISTORY:  Past Medical History:  Diagnosis Date  .  Arthritis   . Hypertension   . Thyroid disease   Chronic systolic CHF Iron deficiency   SURGICAL HISTORY: Past Surgical History:  Procedure Laterality Date  . CARDIAC CATHETERIZATION N/A 01/07/2016   Procedure: Left Heart Cath and Coronary Angiography;  Surgeon: Jettie Booze, MD;  Location: [facility];  Service: Cardiovascular;  Laterality: N/A;  . CATARACT EXTRACTION, BILATERAL    . HIP SURGERY      SOCIAL HISTORY: Social History   Social History  . Marital status: Single    Spouse name: N/A  . Number of children: N/A  . Years of education: N/A   Occupational History  . Not on file.   Social History Main Topics  . Smoking status: Former Research scientist (life sciences)  . Smokeless tobacco: Never Used  . Alcohol use No  . Drug use: No  . Sexual activity: Not on file   Other Topics Concern  . Not on file   Social History Narrative  . No narrative on file    FAMILY HISTORY: Family History  Problem Relation Age of Onset  . Heart disease Mother     ALLERGIES:  has No Known Allergies.  MEDICATIONS:  Current Outpatient Prescriptions  Medication Sig Dispense Refill  . aspirin EC 325 MG tablet Take 325 mg by mouth every other day.    . B COMPLEX-C-CALCIUM PO Take 1 tablet by mouth daily.    . carvedilol (COREG) 6.25 MG tablet Take 6.25 mg by mouth 2 (two) times daily with a meal.     . hydrochlorothiazide (MICROZIDE) 12.5 MG  capsule Take 12.5 mg by mouth daily.    Marland Kitchen levothyroxine (SYNTHROID, LEVOTHROID) 112 MCG tablet Take 112 mcg by mouth See admin instructions. Sun/Tues/Thurs/Sat    . levothyroxine (SYNTHROID, LEVOTHROID) 125 MCG tablet Take 125 mcg by mouth every Monday, Wednesday, and Friday.     . losartan (COZAAR) 25 MG tablet Take 12.5 mg by mouth daily.    Marland Kitchen POTASSIUM PO Take 99 mg by mouth 2 (two) times a week.    . predniSONE (DELTASONE) 20 MG tablet Take 1 tablet (20 mg total) by mouth daily with breakfast. 10 tablet 0  . vitamin E 1000 UNIT capsule Take 1,000  Units by mouth every other day.     No current facility-administered medications for this visit.     REVIEW OF SYSTEMS:    10 Point review of Systems was done is negative except as noted above.  PHYSICAL EXAMINATION: ECOG PERFORMANCE STATUS: 2 - Symptomatic, <50% confined to bed  Vital Signs reviewed in EPIC. GENERAL:alert, in no acute distress and comfortable SKIN: no acute rashes, no significant lesions EYES: conjunctiva are pink and non-injected, sclera anicteric OROPHARYNX: MMM, no exudates, no oropharyngeal erythema or ulceration NECK: supple, Mild JVD LYMPH:  no palpable lymphadenopathy in the cervical, axillary or inguinal regions LUNGS: clear to auscultation b/l with normal respiratory effort HEART: regular rate & rhythm ABDOMEN:  normoactive bowel sounds , non tender, not distended. no allergic palpable hepatosplenomegaly  Extremity: 1+  pedal edema Bilaterally  PSYCH: alert & oriented x 3 with fluent speech NEURO: no focal motor/sensory deficits  LABORATORY DATA:  I have reviewed the data as listed  . CBC Latest Ref Rng & Units 07/13/2016 07/04/2016 06/27/2016  WBC 3.9 - 10.3 10e3/uL 6.3 8.8 18.6(H)  Hemoglobin 11.6 - 15.9 g/dL 11.2(L) 9.6(L) 10.3(L)  Hematocrit 34.8 - 46.6 % 31.5(L) 28.7(L) 30.5(L)  Platelets 145 - 400 10e3/uL 187 128(L) 110(L)   . CBC    Component Value Date/Time   WBC 6.3 07/13/2016 0857   WBC 14.9 (H) 06/09/2016 0938   RBC 3.89 07/13/2016 0857   RBC 3.54 (L) 06/09/2016 0938   HGB 11.2 (L) 07/13/2016 0857   HCT 31.5 (L) 07/13/2016 0857   PLT 187 07/13/2016 0857   PLT 118 (L) 05/04/2016 1435   MCV 80.8 07/13/2016 0857   MCH 28.7 07/13/2016 0857   MCH 28.0 06/09/2016 0938   MCHC 35.5 07/13/2016 0857   MCHC 33.6 06/09/2016 0938   RDW 17.1 (H) 07/13/2016 0857   LYMPHSABS 2.2 07/13/2016 0857   MONOABS 0.4 07/13/2016 0857   EOSABS 0.0 07/13/2016 0857   EOSABS 0.1 05/04/2016 1435   BASOSABS 0.1 07/13/2016 0857    . CMP Latest Ref Rng &  Units 07/13/2016 07/04/2016 06/27/2016  Glucose 70 - 140 mg/dl 102 112 94  BUN 7.0 - 26.0 mg/dL 15.1 12.5 14.4  Creatinine 0.6 - 1.1 mg/dL 0.8 0.7 0.7  Sodium 136 - 145 mEq/L 135(L) 132(L) 135(L)  Potassium 3.5 - 5.1 mEq/L 4.0 4.0 4.3  Chloride 96 - 106 mmol/L - - -  CO2 22 - 29 mEq/L 27 27 27   Calcium 8.4 - 10.4 mg/dL 9.2 8.6 9.0  Total Protein 6.4 - 8.3 g/dL 7.1 6.7 7.2  Total Bilirubin 0.20 - 1.20 mg/dL 0.65 0.57 0.69  Alkaline Phos 40 - 150 U/L 121 148 85  AST 5 - 34 U/L 25 27 30   ALT 0 - 55 U/L 13 11 7     . Lab Results  Component Value Date  IRON 66 06/15/2016   TIBC 354 06/15/2016   IRONPCTSAT 19 (L) 06/15/2016   (Iron and TIBC)  Lab Results  Component Value Date   FERRITIN 266 06/15/2016    Lab Results  Component Value Date   LDH 394 (H) 06/27/2016   Component     Latest Ref Rng & Units 05/16/2016  Sed Rate     0 - 40 mm/hr 22  Erythropoietin     2.6 - 18.5 mIU/mL 27.2 (H)  LDH     125 - 245 U/L 573 (H)  Haptoglobin     34 - 200 mg/dL 53           RADIOGRAPHIC STUDIES: I have personally reviewed the radiological images as listed and agreed with the findings in the report. No results found.  ASSESSMENT & PLAN:   83 yo with   1) Newly diagnosed Splenic marginal zone lymphoma With splenomegaly of about 16 cm. Bone marrow involvement as noted above with anemia, lymphocytosis, thrombocytopenia. Patient  Has significant constitutional symptoms including fatigue, some low-grade fevers and night sweats. Also has abdominal discomfort related to her splenomegaly and anorexia. Serum kappa lambda free light chain ratio abnormally elevated. 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 Hgb improved to 11.2 Thrombocytopenia improved PLT up from 80-90k to 128k and has resolved 187k today.  3) hypothyroidism 4) nonischemic systolic cardiomyopathy -  ejection fraction 40-45% on echo. Cardiac catheterization December 2017- nonobstructive coronary artery disease.  5) Rigors and fever related to Rituxan vs Tumor lysis after 1st dose. Has not add any of these issues with adjusted pre-medications with the 2nd dose of Rituxan and is overall feeling much better. PLAN -Patient has no fevers or chills at this time he has been eating well. No other prohibitive toxicities from treatment currently. -labs are improved -she will proceed with her 3rd weekly dose of Rituxan. -continue current ordered pre-medications. She will get Tylenol, Benadryl, famotidine, dexamethasone as premedications. -Given her previous rigors we shall premedicate her with Demerol 12.5 mg IV prophylactically as well -We'll need monitoring for hypersensitivity reactions .  Continue Rituxan weekly to complete the planned 4 doses RTC with Dr [name] in 3 weeks with labs   All of the patients and her accompanying family's questions were answered with apparent satisfaction. The patient knows to call the clinic with any problems, questions or concerns.  I spent 20 minutes counseling the patient face to face. The total time spent in the appointment was 25 minutes and more than 50% was on counseling and direct patient cares.    Sullivan Lone MD [place] AAHIVMS [facility] Hematology/Oncology Physician [facility]  (Office):       [phone] (Work cell):  [phone] (Fax):           [phone]

## 2016-07-18 ENCOUNTER — Telehealth: Payer: Self-pay | Admitting: Hematology

## 2016-07-18 NOTE — Telephone Encounter (Signed)
Scheduled appt per 6/27 los - Sent reminder letter  In the mail and left message with appt date and time.

## 2016-07-21 ENCOUNTER — Other Ambulatory Visit: Payer: Self-pay | Admitting: Hematology

## 2016-07-22 ENCOUNTER — Other Ambulatory Visit (HOSPITAL_BASED_OUTPATIENT_CLINIC_OR_DEPARTMENT_OTHER): Payer: Medicare Other

## 2016-07-22 ENCOUNTER — Ambulatory Visit (HOSPITAL_BASED_OUTPATIENT_CLINIC_OR_DEPARTMENT_OTHER): Payer: Medicare Other

## 2016-07-22 VITALS — BP 141/59 | HR 71 | Temp 97.7°F | Resp 18

## 2016-07-22 DIAGNOSIS — Z5112 Encounter for antineoplastic immunotherapy: Secondary | ICD-10-CM | POA: Diagnosis not present

## 2016-07-22 DIAGNOSIS — C8307 Small cell B-cell lymphoma, spleen: Secondary | ICD-10-CM | POA: Diagnosis not present

## 2016-07-22 LAB — CBC & DIFF AND RETIC
BASO%: 0.5 % (ref 0.0–2.0)
BASOS ABS: 0 10*3/uL (ref 0.0–0.1)
EOS ABS: 0.1 10*3/uL (ref 0.0–0.5)
EOS%: 1 % (ref 0.0–7.0)
HEMATOCRIT: 31.7 % — AB (ref 34.8–46.6)
HEMOGLOBIN: 10.8 g/dL — AB (ref 11.6–15.9)
IMMATURE RETIC FRACT: 3.8 % (ref 1.60–10.00)
LYMPH%: 29.4 % (ref 14.0–49.7)
MCH: 27.9 pg (ref 25.1–34.0)
MCHC: 34.1 g/dL (ref 31.5–36.0)
MCV: 81.9 fL (ref 79.5–101.0)
MONO#: 0.4 10*3/uL (ref 0.1–0.9)
MONO%: 6.9 % (ref 0.0–14.0)
NEUT%: 62.2 % (ref 38.4–76.8)
NEUTROS ABS: 3.7 10*3/uL (ref 1.5–6.5)
Platelets: 182 10*3/uL (ref 145–400)
RBC: 3.87 10*6/uL (ref 3.70–5.45)
RDW: 17 % — AB (ref 11.2–14.5)
RETIC %: 2.7 % — AB (ref 0.70–2.10)
Retic Ct Abs: 104.49 10*3/uL — ABNORMAL HIGH (ref 33.70–90.70)
WBC: 5.9 10*3/uL (ref 3.9–10.3)
lymph#: 1.7 10*3/uL (ref 0.9–3.3)

## 2016-07-22 LAB — COMPREHENSIVE METABOLIC PANEL
ALBUMIN: 3.4 g/dL — AB (ref 3.5–5.0)
ALK PHOS: 101 U/L (ref 40–150)
ALT: 14 U/L (ref 0–55)
AST: 26 U/L (ref 5–34)
Anion Gap: 9 mEq/L (ref 3–11)
BILIRUBIN TOTAL: 0.8 mg/dL (ref 0.20–1.20)
BUN: 16.5 mg/dL (ref 7.0–26.0)
CALCIUM: 9.2 mg/dL (ref 8.4–10.4)
CO2: 25 mEq/L (ref 22–29)
CREATININE: 0.8 mg/dL (ref 0.6–1.1)
Chloride: 102 mEq/L (ref 98–109)
EGFR: 68 mL/min/{1.73_m2} — ABNORMAL LOW (ref >90)
GLUCOSE: 85 mg/dL (ref 70–140)
Potassium: 4.1 mEq/L (ref 3.5–5.1)
SODIUM: 136 meq/L (ref 136–145)
TOTAL PROTEIN: 7.3 g/dL (ref 6.4–8.3)

## 2016-07-22 LAB — LACTATE DEHYDROGENASE: LDH: 178 U/L (ref 125–245)

## 2016-07-22 MED ORDER — MEPERIDINE HCL 25 MG/ML IJ SOLN
12.5000 mg | Freq: Once | INTRAMUSCULAR | Status: AC
Start: 2016-07-22 — End: 2016-07-22
  Administered 2016-07-22: 12.5 mg via INTRAVENOUS

## 2016-07-22 MED ORDER — ACETAMINOPHEN 500 MG PO TABS
ORAL_TABLET | ORAL | Status: AC
  Filled 2016-07-22: qty 2

## 2016-07-22 MED ORDER — MEPERIDINE HCL 25 MG/ML IJ SOLN
INTRAMUSCULAR | Status: AC
  Filled 2016-07-22: qty 1

## 2016-07-22 MED ORDER — SODIUM CHLORIDE 0.9 % IV SOLN
375.0000 mg/m2 | Freq: Once | INTRAVENOUS | Status: AC
  Administered 2016-07-22: 600 mg via INTRAVENOUS
  Filled 2016-07-22: qty 50

## 2016-07-22 MED ORDER — DIPHENHYDRAMINE HCL 25 MG PO CAPS
ORAL_CAPSULE | ORAL | Status: AC
  Filled 2016-07-22: qty 2

## 2016-07-22 MED ORDER — SODIUM CHLORIDE 0.9 % IV SOLN
12.0000 mg | Freq: Once | INTRAVENOUS | Status: AC
  Administered 2016-07-22: 12 mg via INTRAVENOUS
  Filled 2016-07-22: qty 1.2

## 2016-07-22 MED ORDER — FAMOTIDINE 20 MG PO TABS
20.0000 mg | ORAL_TABLET | Freq: Once | ORAL | Status: AC
  Administered 2016-07-22: 20 mg via ORAL

## 2016-07-22 MED ORDER — FAMOTIDINE 20 MG PO TABS
ORAL_TABLET | ORAL | Status: AC
  Filled 2016-07-22: qty 1

## 2016-07-22 MED ORDER — DIPHENHYDRAMINE HCL 25 MG PO CAPS
50.0000 mg | ORAL_CAPSULE | Freq: Once | ORAL | Status: AC
  Administered 2016-07-22: 50 mg via ORAL

## 2016-07-22 MED ORDER — SODIUM CHLORIDE 0.9 % IV SOLN
Freq: Once | INTRAVENOUS | Status: AC
  Administered 2016-07-22: 14:00:00 via INTRAVENOUS

## 2016-07-22 MED ORDER — ACETAMINOPHEN 500 MG PO TABS
1000.0000 mg | ORAL_TABLET | Freq: Once | ORAL | Status: AC
  Administered 2016-07-22: 1000 mg via ORAL

## 2016-07-22 NOTE — Patient Instructions (Signed)
[facility] Discharge Instructions for Patients Receiving Chemotherapy  Today you received the following chemotherapy agents Rituxan To help prevent nausea and vomiting after your treatment, we encourage you to take your nausea medication as prescribed.  If you develop nausea and vomiting that is not controlled by your nausea medication, call the clinic.   BELOW ARE SYMPTOMS THAT SHOULD BE REPORTED IMMEDIATELY:  *FEVER GREATER THAN 100.5 F  *CHILLS WITH OR WITHOUT FEVER  NAUSEA AND VOMITING THAT IS NOT CONTROLLED WITH YOUR NAUSEA MEDICATION  *UNUSUAL SHORTNESS OF BREATH  *UNUSUAL BRUISING OR BLEEDING  TENDERNESS IN MOUTH AND THROAT WITH OR WITHOUT PRESENCE OF ULCERS  *URINARY PROBLEMS  *BOWEL PROBLEMS  UNUSUAL RASH Items with * indicate a potential emergency and should be followed up as soon as possible.  Feel free to call the clinic you have any questions or concerns. The clinic phone number is [phone].  Please show the CHEMO ALERT CARD at check-in to the Emergency Department and triage nurse.   

## 2016-08-05 ENCOUNTER — Other Ambulatory Visit: Payer: Self-pay | Admitting: *Deleted

## 2016-08-05 DIAGNOSIS — C8307 Small cell B-cell lymphoma, spleen: Secondary | ICD-10-CM

## 2016-08-08 ENCOUNTER — Encounter: Payer: Self-pay | Admitting: Hematology

## 2016-08-08 ENCOUNTER — Ambulatory Visit (HOSPITAL_BASED_OUTPATIENT_CLINIC_OR_DEPARTMENT_OTHER): Payer: Medicare Other | Admitting: Hematology

## 2016-08-08 ENCOUNTER — Other Ambulatory Visit (HOSPITAL_BASED_OUTPATIENT_CLINIC_OR_DEPARTMENT_OTHER): Payer: Medicare Other

## 2016-08-08 ENCOUNTER — Telehealth: Payer: Self-pay | Admitting: Hematology

## 2016-08-08 VITALS — BP 153/71 | HR 84 | Temp 97.7°F | Resp 18 | Ht 63.0 in | Wt 120.7 lb

## 2016-08-08 DIAGNOSIS — E039 Hypothyroidism, unspecified: Secondary | ICD-10-CM | POA: Diagnosis not present

## 2016-08-08 DIAGNOSIS — D696 Thrombocytopenia, unspecified: Secondary | ICD-10-CM

## 2016-08-08 DIAGNOSIS — C8307 Small cell B-cell lymphoma, spleen: Secondary | ICD-10-CM

## 2016-08-08 DIAGNOSIS — C884 Extranodal marginal zone B-cell lymphoma of mucosa-associated lymphoid tissue [MALT-lymphoma]: Secondary | ICD-10-CM

## 2016-08-08 DIAGNOSIS — D649 Anemia, unspecified: Secondary | ICD-10-CM | POA: Diagnosis not present

## 2016-08-08 LAB — COMPREHENSIVE METABOLIC PANEL
ALT: 15 U/L (ref 0–55)
AST: 25 U/L (ref 5–34)
Albumin: 3.6 g/dL (ref 3.5–5.0)
Alkaline Phosphatase: 76 U/L (ref 40–150)
Anion Gap: 7 mEq/L (ref 3–11)
BILIRUBIN TOTAL: 0.8 mg/dL (ref 0.20–1.20)
BUN: 21.2 mg/dL (ref 7.0–26.0)
CHLORIDE: 97 meq/L — AB (ref 98–109)
CO2: 30 meq/L — AB (ref 22–29)
CREATININE: 0.8 mg/dL (ref 0.6–1.1)
Calcium: 9.5 mg/dL (ref 8.4–10.4)
EGFR: 62 mL/min/{1.73_m2} — ABNORMAL LOW (ref >90)
Glucose: 82 mg/dl (ref 70–140)
Potassium: 4 mEq/L (ref 3.5–5.1)
Sodium: 134 mEq/L — ABNORMAL LOW (ref 136–145)
Total Protein: 7.3 g/dL (ref 6.4–8.3)

## 2016-08-08 LAB — CBC WITH DIFFERENTIAL/PLATELET
BASO%: 0.4 % (ref 0.0–2.0)
Basophils Absolute: 0 10*3/uL (ref 0.0–0.1)
EOS%: 1.1 % (ref 0.0–7.0)
Eosinophils Absolute: 0.1 10*3/uL (ref 0.0–0.5)
HEMATOCRIT: 30.6 % — AB (ref 34.8–46.6)
HGB: 10.8 g/dL — ABNORMAL LOW (ref 11.6–15.9)
LYMPH#: 1.2 10*3/uL (ref 0.9–3.3)
LYMPH%: 22.9 % (ref 14.0–49.7)
MCH: 29 pg (ref 25.1–34.0)
MCHC: 35.3 g/dL (ref 31.5–36.0)
MCV: 82 fL (ref 79.5–101.0)
MONO#: 0.3 10*3/uL (ref 0.1–0.9)
MONO%: 6.1 % (ref 0.0–14.0)
NEUT%: 69.5 % (ref 38.4–76.8)
NEUTROS ABS: 3.7 10*3/uL (ref 1.5–6.5)
Platelets: 170 10*3/uL (ref 145–400)
RBC: 3.73 10*6/uL (ref 3.70–5.45)
RDW: 17.5 % — ABNORMAL HIGH (ref 11.2–14.5)
WBC: 5.4 10*3/uL (ref 3.9–10.3)

## 2016-08-08 LAB — LACTATE DEHYDROGENASE: LDH: 176 U/L (ref 125–245)

## 2016-08-08 NOTE — Telephone Encounter (Signed)
Scheduled appt per 7/23 los - Gave patient AVS and calender per los. - [facility] to contact patient with [place]

## 2016-08-08 NOTE — Patient Instructions (Signed)
Thank you for choosing [facility] to provide your oncology and hematology care.  To afford each patient quality time with our providers, please arrive 30 minutes before your scheduled appointment time.  If you arrive late for your appointment, you may be asked to reschedule.  We strive to give you quality time with our providers, and arriving late affects you and other patients whose appointments are after yours.  If you are a no show for multiple scheduled visits, you may be dismissed from the clinic at the providers discretion.   Again, thank you for choosing [facility], our hope is that these requests will decrease the amount of time that you wait before being seen by our physicians.  ______________________________________________________________________ Should you have questions after your visit to the [facility], please contact our office at [phone] between the hours of 8:30 and 4:30 p.m.    Voicemails left after 4:30p.m will not be returned until the following business day.   For prescription refill requests, please have your pharmacy contact us directly.  Please also try to allow 48 hours for prescription requests.   Please contact the scheduling department for questions regarding scheduling.  For scheduling of procedures such as PET scans, CT scans, MRI, Ultrasound, etc please contact central scheduling at [phone].   Resources For Cancer Patients and Caregivers:  American Cancer Society:  800-227-2345  Can help patients locate various types of support and financial assistance Cancer Care: 1-800-813-HOPE (4673) Provides financial assistance, online support groups, medication/co-pay assistance.   [place] DSS:  336-641-3447 Where to apply for food stamps, Medicaid, and utility assistance Medicare Rights Center: 800-333-4114 Helps people with Medicare understand their rights and benefits, navigate the Medicare system, and secure the  quality healthcare they deserve SCAT: 336-333-6589 [employer]'s shared-ride transportation service for eligible riders who have a disability that prevents them from riding the fixed route bus.   For additional information on assistance programs please contact our social worker:   Grier Hock/Abigail Elmore:  [phone] 

## 2016-08-08 NOTE — Progress Notes (Signed)
Marland Kitchen    HEMATOLOGY/ONCOLOGY CONSULTATION NOTE  Date of Service: .08/08/2016  Patient Care Team: [name], MD as PCP - General (Family Medicine)  CHIEF COMPLAINTS/PURPOSE OF CONSULTATION:  F/u for splenic marginal zone lymphoma.  HISTORY OF PRESENTING ILLNESS:   Kathleen Arias is a wonderful 83 y.o. female who has been referred to [place] by Dr .Redmond Pulling, Jama Flavors, MD for evaluation and management of anemia, leukocytosis and thrombocytopenia.  Patient is a very pleasant female who is here with her daughter. She has a history of hypertension, chronic systolic CHF [ejection fraction of 25-35% based on recent cardiac catheterization on 01/13/2016, echo done showed ejection fraction of 40-45%, nonischemic cardiomyopathy], iron deficiency,  Patient presented to his primary care physician with some dyspnea on exertion which is primarily related to her systolic CHF but she was also noted to have recent blood counts with her primary care physician on 03/15/2016 which showed anemia with a hemoglobin of 9.7 and a hematocrit of 79, platelet count of 137k , WBC count of 11.7k with 7.6k lymphocytes.  Iron studies were done which showed a ferritin of 84 and an iron saturation of 9%. She has been on oral iron replacement by her primary care physician and notes significant constipation and GI intolerance. She reports that she had a FOBT in 12/2015  which was negative.   Patient notes significant fatigue and dyspnea on exertion. Notes weight loss of about 15 pounds since September 2017. Weight is down from 142 to 124lbs.   Patient denies any enlarged lymph nodes, no night sweats.  INTERVAL HISTORY  [name] is here for follow-up along with her daughter for management of splenic marginal zone lymphoma. She completed her fourth weekly dose of Rituxan on 07/22/2016. Labs today show stable hemoglobin at 10.8 with resolution of thrombocytopenia. LDH level has normalized. Patient notes improved appetite and  energy levels. No fevers no chills no night sweats no left upper quadrant abdominal pain. She is happy with the response to treatment. We discussed about the pros and cons of maintenance Rituxan and decided to revisit this on her next follow-up in about 7 weeks with reassessment of labs and ultrasound of the abdomen. No other acute new symptoms at this time.   MEDICAL HISTORY:  Past Medical History:  Diagnosis Date  . Arthritis   . Hypertension   . Thyroid disease   Chronic systolic CHF Iron deficiency   SURGICAL HISTORY: Past Surgical History:  Procedure Laterality Date  . CARDIAC CATHETERIZATION N/A 01/07/2016   Procedure: Left Heart Cath and Coronary Angiography;  Surgeon: Jettie Booze, MD;  Location: [place] [facility];  Service: Cardiovascular;  Laterality: N/A;  . CATARACT EXTRACTION, BILATERAL    . HIP SURGERY      SOCIAL HISTORY: Social History   Social History  . Marital status: Single    Spouse name: N/A  . Number of children: N/A  . Years of education: N/A   Occupational History  . Not on file.   Social History Main Topics  . Smoking status: Former Research scientist (life sciences)  . Smokeless tobacco: Never Used  . Alcohol use No  . Drug use: No  . Sexual activity: Not on file   Other Topics Concern  . Not on file   Social History Narrative  . No narrative on file    FAMILY HISTORY: Family History  Problem Relation Age of Onset  . Heart disease Mother     ALLERGIES:  has No Known Allergies.  MEDICATIONS:  Current Outpatient Prescriptions  Medication Sig Dispense Refill  . aspirin EC 325 MG tablet Take 325 mg by mouth every other day.    . B COMPLEX-C-CALCIUM PO Take 1 tablet by mouth daily.    . carvedilol (COREG) 6.25 MG tablet Take 6.25 mg by mouth 2 (two) times daily with a meal.     . hydrochlorothiazide (MICROZIDE) 12.5 MG capsule Take 12.5 mg by mouth daily.    Marland Kitchen levothyroxine (SYNTHROID, LEVOTHROID) 112 MCG tablet Take 112 mcg by mouth See admin  instructions. Sun/Tues/Thurs/Sat    . levothyroxine (SYNTHROID, LEVOTHROID) 125 MCG tablet Take 125 mcg by mouth every Monday, Wednesday, and Friday.     . losartan (COZAAR) 25 MG tablet Take 12.5 mg by mouth daily.    Marland Kitchen POTASSIUM PO Take 99 mg by mouth 2 (two) times a week.    . predniSONE (DELTASONE) 20 MG tablet Take 1 tablet (20 mg total) by mouth daily with breakfast. 10 tablet 0  . vitamin E 1000 UNIT capsule Take 1,000 Units by mouth every other day.     No current facility-administered medications for this visit.     REVIEW OF SYSTEMS:    10 Point review of Systems was done is negative except as noted above.  PHYSICAL EXAMINATION: ECOG PERFORMANCE STATUS: 2 - Symptomatic, <50% confined to bed  Vital Signs reviewed in EPIC. GENERAL:alert, in no acute distress and comfortable SKIN: no acute rashes, no significant lesions EYES: conjunctiva are pink and non-injected, sclera anicteric OROPHARYNX: MMM, no exudates, no oropharyngeal erythema or ulceration NECK: supple, Mild JVD LYMPH:  no palpable lymphadenopathy in the cervical, axillary or inguinal regions LUNGS: clear to auscultation b/l with normal respiratory effort HEART: regular rate & rhythm ABDOMEN:  normoactive bowel sounds , non tender, not distended. no allergic palpable hepatosplenomegaly  Extremity: 1+  pedal edema Bilaterally  PSYCH: alert & oriented x 3 with fluent speech NEURO: no focal motor/sensory deficits  LABORATORY DATA:  I have reviewed the data as listed  . CBC Latest Ref Rng & Units 08/08/2016 07/22/2016 07/13/2016  WBC 3.9 - 10.3 10e3/uL 5.4 5.9 6.3  Hemoglobin 11.6 - 15.9 g/dL 10.8(L) 10.8(L) 11.2(L)  Hematocrit 34.8 - 46.6 % 30.6(L) 31.7(L) 31.5(L)  Platelets 145 - 400 10e3/uL 170 182 187   . CBC    Component Value Date/Time   WBC 5.4 08/08/2016 0920   WBC 14.9 (H) 06/09/2016 0938   RBC 3.73 08/08/2016 0920   RBC 3.54 (L) 06/09/2016 0938   HGB 10.8 (L) 08/08/2016 0920   HCT 30.6 (L)  08/08/2016 0920   PLT 170 08/08/2016 0920   PLT 118 (L) 05/04/2016 1435   MCV 82.0 08/08/2016 0920   MCH 29.0 08/08/2016 0920   MCH 28.0 06/09/2016 0938   MCHC 35.3 08/08/2016 0920   MCHC 33.6 06/09/2016 0938   RDW 17.5 (H) 08/08/2016 0920   LYMPHSABS 1.2 08/08/2016 0920   MONOABS 0.3 08/08/2016 0920   EOSABS 0.1 08/08/2016 0920   EOSABS 0.1 05/04/2016 1435   BASOSABS 0.0 08/08/2016 0920    . CMP Latest Ref Rng & Units 07/22/2016 07/13/2016 07/04/2016  Glucose 70 - 140 mg/dl 85 102 112  BUN 7.0 - 26.0 mg/dL 16.5 15.1 12.5  Creatinine 0.6 - 1.1 mg/dL 0.8 0.8 0.7  Sodium 136 - 145 mEq/L 136 135(L) 132(L)  Potassium 3.5 - 5.1 mEq/L 4.1 4.0 4.0  Chloride 96 - 106 mmol/L - - -  CO2 22 - 29 mEq/L 25 27 27   Calcium  8.4 - 10.4 mg/dL 9.2 9.2 8.6  Total Protein 6.4 - 8.3 g/dL 7.3 7.1 6.7  Total Bilirubin 0.20 - 1.20 mg/dL 0.80 0.65 0.57  Alkaline Phos 40 - 150 U/L 101 121 148  AST 5 - 34 U/L 26 25 27   ALT 0 - 55 U/L 14 13 11     Lab Results  Component Value Date   FERRITIN 266 06/15/2016            RADIOGRAPHIC STUDIES: I have personally reviewed the radiological images as listed and agreed with the findings in the report. No results found.  ASSESSMENT & PLAN:   83 yo with   1) Splenic marginal zone lymphoma With splenomegaly of about 16 cm. Bone marrow involvement as noted above with anemia, lymphocytosis, thrombocytopenia. Patient  Has significant constitutional symptoms including fatigue, some low-grade fevers and night sweats. Also has abdominal discomfort related to her splenomegaly and anorexia. Serum kappa lambda free light chain ratio abnormally elevated. Myeloma panel no M spike IFE shows polyclonal gammopathy  Patient is status post Rituxan weekly 4 doses. Thrombocytopenia has resolved, LDH level has normalized.  Hemoglobin is stable. Lymphocytosis has resolved. Her constitutional symptoms of resolved. All of these suggest good response to treatment.     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  Hgb  Is stable at 10.8 Thrombocytopenia improved PLT up from 80-90k to 128k and has resolved 170k today.  3) hypothyroidism 4) nonischemic systolic cardiomyopathy - ejection fraction 40-45% on echo. Cardiac catheterization December 2017- nonobstructive coronary artery disease.  5) Rigors and fever related to Rituxan vs Tumor lysis after 1st dose. Has not add any of these issues with adjusted pre-medications with the 2nd dose of Rituxan and is overall feeling much better. PLAN  -Patient appears to have had good clinical and lab evidence of response to treatment. -We will see her back in about 6-7 weeks with repeat labs and ultrasound of the abdomen to evaluate spleen size and to reevaluate disease status. -We briefly discussed again the role of maintenance Rituxan. Given her comorbidities and age we are being a little ginger about trying to avoid maintenance Rituxan unless needed. -I discussed the pros and cons of this with the patient and her daughter and they would like to think about it and decide on her next visit which is quite reasonable.   abs and [place] abd in 6 weeks RTC with Dr [name] in 7 weeks  All of the patients and her accompanying family's questions were answered with apparent satisfaction. The patient knows to call the clinic with any problems, questions or concerns.  I spent 20 minutes counseling the patient face to face. The total time spent in the appointment was 25 minutes and more than 50% was on counseling and direct patient cares.    Sullivan Lone MD [place] AAHIVMS [facility] Hematology/Oncology Physician [facility]  (Office):       [phone] (Work cell):  [phone] (Fax):           [phone]

## 2016-09-20 ENCOUNTER — Telehealth: Payer: Self-pay

## 2016-09-20 ENCOUNTER — Other Ambulatory Visit: Payer: Medicare Other

## 2016-09-20 ENCOUNTER — Ambulatory Visit (HOSPITAL_COMMUNITY)
Admission: RE | Admit: 2016-09-20 | Discharge: 2016-09-20 | Disposition: A | Discharge status: Home / Self Care | Payer: Medicare Other | Source: Ambulatory Visit | Attending: Hematology | Admitting: Hematology

## 2016-09-20 DIAGNOSIS — K8020 Calculus of gallbladder without cholecystitis without obstruction: Secondary | ICD-10-CM | POA: Insufficient documentation

## 2016-09-20 DIAGNOSIS — I700 Atherosclerosis of aorta: Secondary | ICD-10-CM | POA: Diagnosis not present

## 2016-09-20 DIAGNOSIS — C8307 Small cell B-cell lymphoma, spleen: Secondary | ICD-10-CM | POA: Insufficient documentation

## 2016-09-20 DIAGNOSIS — R161 Splenomegaly, not elsewhere classified: Secondary | ICD-10-CM | POA: Insufficient documentation

## 2016-09-20 NOTE — Telephone Encounter (Signed)
Received call report from [facility] radiology of abd ultrasound.

## 2016-09-21 ENCOUNTER — Telehealth: Payer: Self-pay

## 2016-09-21 NOTE — Telephone Encounter (Signed)
In response to report from [facility] Radiology. Results printed and given to Dr. [name]. Pt improved from last. No new orders or changes at this time.

## 2016-09-26 NOTE — Progress Notes (Signed)
Marland Kitchen    HEMATOLOGY/ONCOLOGY CONSULTATION NOTE  Date of Service: .08/08/2016  Patient Care Team: [name], MD as PCP - General (Family Medicine)  CHIEF COMPLAINTS/PURPOSE OF CONSULTATION:  F/u for splenic marginal zone lymphoma.  HISTORY OF PRESENTING ILLNESS:   Kathleen Arias is a wonderful 83 y.o. female who has been referred to [place] by Dr .Redmond Pulling, Jama Flavors, MD for evaluation and management of anemia, leukocytosis and thrombocytopenia.  Patient is a very pleasant female who is here with her daughter. She has a history of hypertension, chronic systolic CHF [ejection fraction of 25-35% based on recent cardiac catheterization on 01/13/2016, echo done showed ejection fraction of 40-45%, nonischemic cardiomyopathy], iron deficiency,  Patient presented to his primary care physician with some dyspnea on exertion which is primarily related to her systolic CHF but she was also noted to have recent blood counts with her primary care physician on 03/15/2016 which showed anemia with a hemoglobin of 9.7 and a hematocrit of 79, platelet count of 137k , WBC count of 11.7k with 7.6k lymphocytes.  Iron studies were done which showed a ferritin of 84 and an iron saturation of 9%. She has been on oral iron replacement by her primary care physician and notes significant constipation and GI intolerance. She reports that she had a FOBT in 12/2015  which was negative.   Patient notes significant fatigue and dyspnea on exertion. Notes weight loss of about 15 pounds since September 2017. Weight is down from 142 to 124lbs.   Patient denies any enlarged lymph nodes, no night sweats.  INTERVAL HISTORY  Kathleen Arias is here for follow-up along with her daughter for management of splenic marginal zone lymphoma and to discuss PET scan. Her energy levels are much better. She reports to getting stronger and eating more which resulted in some weight gain. She has been eating more red meat. She also takes multivitamins  daily. She denies any bleeding, swelling, night sweats enlarged lymph nodes or chills. We discussed about the pros and cons of maintenance Rituxan and after a detailed discussion decided to hold off on this currently. No other acute new symptoms at this time.  MEDICAL HISTORY:  Past Medical History:  Diagnosis Date  . Arthritis   . Hypertension   . Thyroid disease   Chronic systolic CHF Iron deficiency   SURGICAL HISTORY: Past Surgical History:  Procedure Laterality Date  . CARDIAC CATHETERIZATION N/A 01/07/2016   Procedure: Left Heart Cath and Coronary Angiography;  Surgeon: Jettie Booze, MD;  Location: [facility];  Service: Cardiovascular;  Laterality: N/A;  . CATARACT EXTRACTION, BILATERAL    . HIP SURGERY      SOCIAL HISTORY: Social History   Social History  . Marital status: Single    Spouse name: N/A  . Number of children: N/A  . Years of education: N/A   Occupational History  . Not on file.   Social History Main Topics  . Smoking status: Former Research scientist (life sciences)  . Smokeless tobacco: Never Used  . Alcohol use No  . Drug use: No  . Sexual activity: Not on file   Other Topics Concern  . Not on file   Social History Narrative  . No narrative on file    FAMILY HISTORY: Family History  Problem Relation Age of Onset  . Heart disease Mother     ALLERGIES:  has No Known Allergies.  MEDICATIONS:  Current Outpatient Prescriptions  Medication Sig Dispense Refill  . aspirin EC 325 MG tablet  Take 325 mg by mouth every other day.    . B COMPLEX-C-CALCIUM PO Take 1 tablet by mouth daily.    . carvedilol (COREG) 6.25 MG tablet Take 6.25 mg by mouth 2 (two) times daily with a meal.     . hydrochlorothiazide (MICROZIDE) 12.5 MG capsule Take 12.5 mg by mouth daily.    Marland Kitchen levothyroxine (SYNTHROID, LEVOTHROID) 112 MCG tablet Take 112 mcg by mouth See admin instructions. Sun/Tues/Thurs/Sat    . levothyroxine (SYNTHROID, LEVOTHROID) 125 MCG tablet Take 125 mcg by  mouth every Monday, Wednesday, and Friday.     . losartan (COZAAR) 25 MG tablet Take 12.5 mg by mouth daily.    Marland Kitchen POTASSIUM PO Take 99 mg by mouth 2 (two) times a week.    . predniSONE (DELTASONE) 20 MG tablet Take 1 tablet (20 mg total) by mouth daily with breakfast. 10 tablet 0  . vitamin E 1000 UNIT capsule Take 1,000 Units by mouth every other day.     No current facility-administered medications for this visit.     REVIEW OF SYSTEMS:    10 Point review of Systems was done is negative except as noted above.  PHYSICAL EXAMINATION:  ECOG PERFORMANCE STATUS: 2 - Symptomatic, <50% confined to bed  Vital Signs reviewed in EPIC. GENERAL:alert, in no acute distress and comfortable SKIN: no acute rashes, no significant lesions EYES: conjunctiva are pink and non-injected, sclera anicteric OROPHARYNX: MMM, no exudates, no oropharyngeal erythema or ulceration NECK: supple, Mild JVD LYMPH:  no palpable lymphadenopathy in the cervical, axillary or inguinal regions LUNGS: clear to auscultation b/l with normal respiratory effort HEART: regular rate & rhythm ABDOMEN:  normoactive bowel sounds , non tender, not distended. no allergic palpable hepatosplenomegaly     Extremity: 1+  pedal edema Bilaterally  PSYCH: alert & oriented x 3 with fluent speech NEURO: no focal motor/sensory deficits  LABORATORY DATA:  I have reviewed the data as listed  . CBC Latest Ref Rng & Units 09/27/2016 08/08/2016 07/22/2016  WBC 3.9 - 10.3 10e3/uL 6.0 5.4 5.9  Hemoglobin 11.6 - 15.9 g/dL 11.4(L) 10.8(L) 10.8(L)  Hematocrit 34.8 - 46.6 % 32.7(L) 30.6(L) 31.7(L)  Platelets 145 - 400 10e3/uL 223 170 182   . CBC    Component Value Date/Time   WBC 6.0 09/27/2016 1251   WBC 14.9 (H) 06/09/2016 0938   RBC 3.85 09/27/2016 1251   RBC 3.54 (L) 06/09/2016 0938   HGB 11.4 (L) 09/27/2016 1251   HCT 32.7 (L) 09/27/2016 1251   PLT 223 09/27/2016 1251   PLT 118 (L) 05/04/2016 1435   MCV 84.9 09/27/2016 1251   MCH  29.6 09/27/2016 1251   MCH 28.0 06/09/2016 0938   MCHC 34.9 09/27/2016 1251   MCHC 33.6 06/09/2016 0938   RDW 16.7 (H) 09/27/2016 1251   LYMPHSABS 1.1 09/27/2016 1251   MONOABS 0.5 09/27/2016 1251   EOSABS 0.0 09/27/2016 1251   EOSABS 0.1 05/04/2016 1435   BASOSABS 0.0 09/27/2016 1251    . CMP Latest Ref Rng & Units 09/27/2016 08/08/2016 07/22/2016  Glucose 70 - 140 mg/dl 93 82 85  BUN 7.0 - 26.0 mg/dL 18.7 21.2 16.5  Creatinine 0.6 - 1.1 mg/dL 0.9 0.8 0.8  Sodium 136 - 145 mEq/L 136 134(L) 136  Potassium 3.5 - 5.1 mEq/L 3.9 4.0 4.1  Chloride 96 - 106 mmol/L - - -  CO2 22 - 29 mEq/L 28 30(H) 25  Calcium 8.4 - 10.4 mg/dL 9.9 9.5 9.2  Total Protein 6.4 -  8.3 g/dL 7.7 7.3 7.3  Total Bilirubin 0.20 - 1.20 mg/dL 0.97 0.80 0.80  Alkaline Phos 40 - 150 U/L 78 76 101  AST 5 - 34 U/L 22 25 26   ALT 0 - 55 U/L 12 15 14     Lab Results  Component Value Date   FERRITIN 266 06/15/2016            RADIOGRAPHIC STUDIES: I have personally reviewed the radiological images as listed and agreed with the findings in the report. US Abdomen Complete  Result Date: 09/20/2016 CLINICAL DATA:  83 year old female with B-cell lymphoma post treatment. Subsequent encounter. EXAM: ABDOMEN ULTRASOUND COMPLETE COMPARISON:  06/06/2016 PET-CT. FINDINGS: Gallbladder: Gallbladder not well delineated with shadowing in this region. Patient was tender over the mid abdomen and did not allow for further investigation of possibility of tenderness over the gallbladder per ultrasound technologist. Common bile duct: Diameter: 3 mm.  Evaluation limited. Liver: No focal lesion identified. Within normal limits in parenchymal echogenicity. Portal vein is patent on color Doppler imaging with normal direction of blood flow towards the liver. IVC: No abnormality visualized. Pancreas: Visualized portion unremarkable. Spleen: 14.8 x 15.8 x 6.2 cm with volume of 755 cc. 4.2 x 4.2 x 4.2 cm complex mass central aspect. Right Kidney:  Length: 9.1 cm. No hydronephrosis. Evaluation lower pole limited by bowel gas. No obvious mass. Left Kidney: Length: 9.3 cm. Echogenicity within normal limits. No mass or hydronephrosis visualized. Abdominal aorta: Prominent plaque without aneurysm. Other findings: Small bilateral pleural effusions. IMPRESSION: Interval decrease in degree of splenomegaly with spleen currently measuring 14.8 x 15.8 x 6.2 cm consistent with volume of 755 cc. 06/06/2016 PET-CT, spleen measure 23 x 16 x 11 cm. Heterogeneous central splenic mass currently measures 4.2 x 4.2 x 4.2 cm and previously measured 4.8 x 5.2 x 4.6 cm. Gallbladder not well delineated with shadowing in this region. Patient is noted to have a large calcified gallstone on prior CT which may contribute to this appearance. Patient was tender over the mid abdomen and did not allow for further investigation of possibility of tenderness over the gallbladder per ultrasound technologist. Aortic Atherosclerosis (ICD10-I70.0). These results will be called to the ordering clinician or representative by the Radiologist Assistant, and communication documented in the PACS or zVision Dashboard. Electronically Signed   By: Genia Del M.D.   On: 09/20/2016 13:55    ASSESSMENT & PLAN:   83 yo with   1) Splenic marginal zone lymphoma On presentation patient had anemia, lymphocytosis, thrombocytopenia and significant constitutional symptoms including fatigue, some low-grade fevers and night sweats. Also had abdominal discomfort related to her splenomegaly and anorexia with spleen of about 23cms on PET/CT. Serum kappa lambda free light chain ratio abnormally elevated. Myeloma panel no M spike IFE shows polyclonal gammopathy  Patient is status post Rituxan weekly 4 doses. Thrombocytopenia has resolved, LDH level has normalized.  Hemoglobin is stable. Lymphocytosis has resolved. Her constitutional symptoms of resolved. All of these suggest good response to  treatment.  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  Hgb has improved to 11.4 Thrombocytopenia improved PLT up from 80-90k to 128k and has resolved 223k today. Decreased splenomegaly as per rpt [place] abd  3) hypothyroidism 4) nonischemic systolic cardiomyopathy - ejection fraction 40-45% on echo. Cardiac catheterization December 2017- nonobstructive coronary artery disease.  5) Rigors and fever related to Rituxan vs Tumor lysis after 1st dose. Has not add any of these issues with adjusted pre-medications  with the 2nd dose of Rituxan and is overall feeling much better.  PLAN - labs and [place] abd results discussed in details with the patient. She appears to have had a good response to single agent Rituxan treatment. -No constitutional symptoms, cytopenias are improved, LDH is within normal limits and splenomegaly has regressed. -We discussed again the role of maintenance Rituxan. Given her comorbidities and age and based on her preference  we are going to hold off on maintenance Rituxan for now - Abdominal US reviewed with patient and daughter. Her spleen has reduced dramatically in size.    RTC with Dr [name] in 3 months with labs  All of the patients and her accompanying family's questions were answered with apparent satisfaction. The patient knows to call the clinic with any problems, questions or concerns.  I spent 20 minutes counseling the patient face to face. The total time spent in the appointment was 25 minutes and more than 50% was on counseling and direct patient cares.   This document serves as a record of services personally performed by Sullivan Lone, MD. It was created on her behalf by Brandt Loosen, a trained medical scribe. The creation of this record is based on the scribe's personal observations and the provider's statements to them. This document has been checked and approved by the attending provider.  Sullivan Lone MD [place]  AAHIVMS [facility] Hematology/Oncology Physician [facility]  (Office):       [phone] (Work cell):  [phone] (Fax):           [phone]

## 2016-09-27 ENCOUNTER — Ambulatory Visit (HOSPITAL_BASED_OUTPATIENT_CLINIC_OR_DEPARTMENT_OTHER): Payer: Medicare Other

## 2016-09-27 ENCOUNTER — Telehealth: Payer: Self-pay

## 2016-09-27 ENCOUNTER — Encounter: Payer: Self-pay | Admitting: Hematology

## 2016-09-27 ENCOUNTER — Ambulatory Visit (HOSPITAL_BASED_OUTPATIENT_CLINIC_OR_DEPARTMENT_OTHER): Payer: Medicare Other | Admitting: Hematology

## 2016-09-27 VITALS — BP 150/60 | HR 81 | Temp 97.9°F | Resp 20 | Ht 63.0 in | Wt 122.6 lb

## 2016-09-27 DIAGNOSIS — D696 Thrombocytopenia, unspecified: Secondary | ICD-10-CM

## 2016-09-27 DIAGNOSIS — E039 Hypothyroidism, unspecified: Secondary | ICD-10-CM

## 2016-09-27 DIAGNOSIS — C8307 Small cell B-cell lymphoma, spleen: Secondary | ICD-10-CM

## 2016-09-27 DIAGNOSIS — C884 Extranodal marginal zone B-cell lymphoma of mucosa-associated lymphoid tissue [MALT-lymphoma]: Secondary | ICD-10-CM

## 2016-09-27 DIAGNOSIS — D649 Anemia, unspecified: Secondary | ICD-10-CM | POA: Diagnosis not present

## 2016-09-27 LAB — CBC & DIFF AND RETIC
BASO%: 0.3 % (ref 0.0–2.0)
Basophils Absolute: 0 10*3/uL (ref 0.0–0.1)
EOS%: 0.7 % (ref 0.0–7.0)
Eosinophils Absolute: 0 10*3/uL (ref 0.0–0.5)
HCT: 32.7 % — ABNORMAL LOW (ref 34.8–46.6)
HGB: 11.4 g/dL — ABNORMAL LOW (ref 11.6–15.9)
Immature Retic Fract: 1.4 % — ABNORMAL LOW (ref 1.60–10.00)
LYMPH%: 18.2 % (ref 14.0–49.7)
MCH: 29.6 pg (ref 25.1–34.0)
MCHC: 34.9 g/dL (ref 31.5–36.0)
MCV: 84.9 fL (ref 79.5–101.0)
MONO#: 0.5 10*3/uL (ref 0.1–0.9)
MONO%: 7.5 % (ref 0.0–14.0)
NEUT%: 73.3 % (ref 38.4–76.8)
NEUTROS ABS: 4.4 10*3/uL (ref 1.5–6.5)
Platelets: 223 10*3/uL (ref 145–400)
RBC: 3.85 10*6/uL (ref 3.70–5.45)
RDW: 16.7 % — ABNORMAL HIGH (ref 11.2–14.5)
Retic %: 2.12 % — ABNORMAL HIGH (ref 0.70–2.10)
Retic Ct Abs: 81.62 10*3/uL (ref 33.70–90.70)
WBC: 6 10*3/uL (ref 3.9–10.3)
lymph#: 1.1 10*3/uL (ref 0.9–3.3)

## 2016-09-27 LAB — COMPREHENSIVE METABOLIC PANEL
ALBUMIN: 4 g/dL (ref 3.5–5.0)
ALK PHOS: 78 U/L (ref 40–150)
ALT: 12 U/L (ref 0–55)
AST: 22 U/L (ref 5–34)
Anion Gap: 9 mEq/L (ref 3–11)
BUN: 18.7 mg/dL (ref 7.0–26.0)
CO2: 28 mEq/L (ref 22–29)
Calcium: 9.9 mg/dL (ref 8.4–10.4)
Chloride: 99 mEq/L (ref 98–109)
Creatinine: 0.9 mg/dL (ref 0.6–1.1)
EGFR: 59 mL/min/{1.73_m2} — AB (ref >90)
GLUCOSE: 93 mg/dL (ref 70–140)
POTASSIUM: 3.9 meq/L (ref 3.5–5.1)
SODIUM: 136 meq/L (ref 136–145)
Total Bilirubin: 0.97 mg/dL (ref 0.20–1.20)
Total Protein: 7.7 g/dL (ref 6.4–8.3)

## 2016-09-27 LAB — LACTATE DEHYDROGENASE: LDH: 180 U/L (ref 125–245)

## 2016-09-27 NOTE — Telephone Encounter (Signed)
Printed avs and calender per 9/11 los.

## 2016-11-01 ENCOUNTER — Ambulatory Visit (HOSPITAL_COMMUNITY): Payer: Medicare Other | Attending: Cardiovascular Disease

## 2016-11-01 ENCOUNTER — Other Ambulatory Visit: Payer: Self-pay

## 2016-11-01 DIAGNOSIS — E079 Disorder of thyroid, unspecified: Secondary | ICD-10-CM | POA: Insufficient documentation

## 2016-11-01 DIAGNOSIS — I5042 Chronic combined systolic (congestive) and diastolic (congestive) heart failure: Secondary | ICD-10-CM | POA: Insufficient documentation

## 2016-11-01 DIAGNOSIS — I080 Rheumatic disorders of both mitral and aortic valves: Secondary | ICD-10-CM | POA: Diagnosis not present

## 2016-11-01 DIAGNOSIS — I110 Hypertensive heart disease with heart failure: Secondary | ICD-10-CM | POA: Diagnosis not present

## 2016-11-08 ENCOUNTER — Telehealth: Payer: Self-pay

## 2016-11-08 DIAGNOSIS — R931 Abnormal findings on diagnostic imaging of heart and coronary circulation: Secondary | ICD-10-CM

## 2016-11-08 NOTE — Telephone Encounter (Signed)
-----   Message from [name], MD sent at 11/07/2016  5:26 PM EDT ----- EF worse than December check BMET and BNP if latter elevated may start diuretic

## 2016-11-08 NOTE — Telephone Encounter (Signed)
Called patient's daughter [facility]) about patient's echo results. Per Dr. Johnsie Cancel, EF worse than December check BMET and BNP if latter elevated may start diuretic. Patient's daughter verbalized understanding and patient will come in on 11/30 for lab work prior to her office visit on 12/20/16.

## 2016-12-16 ENCOUNTER — Other Ambulatory Visit: Payer: Medicare Other | Admitting: *Deleted

## 2016-12-16 DIAGNOSIS — R931 Abnormal findings on diagnostic imaging of heart and coronary circulation: Secondary | ICD-10-CM

## 2016-12-17 LAB — BASIC METABOLIC PANEL
BUN/Creatinine Ratio: 20 (ref 12–28)
BUN: 18 mg/dL (ref 8–27)
CHLORIDE: 96 mmol/L (ref 96–106)
CO2: 25 mmol/L (ref 20–29)
Calcium: 9 mg/dL (ref 8.7–10.3)
Creatinine, Ser: 0.89 mg/dL (ref 0.57–1.00)
GFR calc Af Amer: 66 mL/min/{1.73_m2} (ref >59)
GFR, EST NON AFRICAN AMERICAN: 58 mL/min/{1.73_m2} — AB (ref >59)
Glucose: 92 mg/dL (ref 65–99)
POTASSIUM: 4 mmol/L (ref 3.5–5.2)
SODIUM: 137 mmol/L (ref 134–144)

## 2016-12-17 LAB — PRO B NATRIURETIC PEPTIDE: NT-PRO BNP: 11829 pg/mL — AB (ref 0–738)

## 2016-12-18 NOTE — Progress Notes (Signed)
Cardiology Office Note    Date:  12/20/2016   ID:  Kathleen Arias, DOB 05/16/1925, MRN [id number]  PCP:  Kathleen Sacramento, MD  Cardiologist: Dr. Johnsie Arias   Chief Complaint: Hospital follow up for CHF  History of Present Illness:   Kathleen Arias is a 83 y.o. female with PHM of HTN and hypothyroidism .   Seen [facility] 01/05/16 with  chest pain. EKG showed LBBB. Echo showed EF 40-45% inferior hypokinesis ? Ischemic moderate MR grade 2 diastolic dysfunction. BNP minimally elevated to 450. Given IV lasix,. Myoview high risk with TID and infarct/ischemia in multiple regions. Even new onset CHF, LBBB and abnormal Myoview in May for cardiac catheterization for definite evaluation of coronary anatomy. Cath showed mild nonobstructive CAD.?  Takatsubo DCM. Discharged on BB and ACE.   F/U echo 11/01/16 EF 25-30% didn't like lasix and HCTZ not strong Enough. BNP over 11,000 November 2018 started on demedex yesterday has not started  Long discussion with patient and daughter She needs to cut out salt has too much potato chips And hot dogs. Only taking coreg once at night as it makes he sleepy.   Past Medical History:  Diagnosis Date  . Arthritis   . Hypertension   . Thyroid disease     Past Surgical History:  Procedure Laterality Date  . CARDIAC CATHETERIZATION N/A 01/07/2016   Procedure: Left Heart Cath and Coronary Angiography;  Surgeon: Kathleen Booze, MD;  Location: [facility];  Service: Cardiovascular;  Laterality: N/A;  . CATARACT EXTRACTION, BILATERAL    . HIP SURGERY      Current Medications: Prior to Admission medications   Medication Sig Start Date End Date Taking? Authorizing Provider  aspirin EC 325 MG tablet Take 325 mg by mouth every other day.    Historical Provider, MD  B COMPLEX-C-CALCIUM PO Take 1 tablet by mouth daily.    Historical Provider, MD  carvedilol (COREG) 6.25 MG tablet Take 1 tablet (6.25 mg total) by mouth 2 (two) times daily with a meal. 01/08/16    Kathleen Poisson, MD  ferrous sulfate 325 (65 FE) MG tablet Take 1 tablet (325 mg total) by mouth daily with breakfast. 01/08/16   Kathleen Poisson, MD  furosemide (LASIX) 20 MG tablet Take 0.5 tablets (10 mg total) by mouth daily. 01/09/16   Kathleen Poisson, MD  levothyroxine (SYNTHROID, LEVOTHROID) 112 MCG tablet Take 112 mcg by mouth See admin instructions. Sun/Tues/Thurs/Sat    Historical Provider, MD  levothyroxine (SYNTHROID, LEVOTHROID) 125 MCG tablet Take 125 mcg by mouth every Monday, Wednesday, and Friday.  08/21/15   Historical Provider, MD  losartan (COZAAR) 50 MG tablet Take 1 tablet (50 mg total) by mouth daily. 01/09/16   Kathleen Poisson, MD  POTASSIUM PO Take 99 mg by mouth 2 (two) times a week.    Historical Provider, MD  vitamin E 1000 UNIT capsule Take 1,000 Units by mouth every other day.    Historical Provider, MD    Allergies:   Patient has no known allergies.   Social History   Socioeconomic History  . Marital status: Single    Spouse name: None  . Number of children: None  . Years of education: None  . Highest education level: None  Social Needs  . Financial resource strain: None  . Food insecurity - worry: None  . Food insecurity - inability: None  . Transportation needs - medical: None  . Transportation needs - non-medical: None  Occupational History  . None  Tobacco Use  . Smoking status: Former Research scientist (life sciences)  . Smokeless tobacco: Never Used  Substance and Sexual Activity  . Alcohol use: No  . Drug use: No  . Sexual activity: None  Other Topics Concern  . None  Social History Narrative  . None     Family History:  The patient's family history includes Heart disease in her mother.   ROS:   Please see the history of present illness.    ROS All other systems reviewed and are negative.   PHYSICAL EXAM:   VS:  BP (!) 160/90   Pulse 71   Ht 5\' 3"  (1.6 m)   Wt 130 lb 12 oz (59.3 kg)   SpO2 97%   BMI 23.16 kg/m    Affect appropriate Healthy:  appears stated  age 25: normal Neck supple with no adenopathy JVP normal no bruits no thyromegaly Lungs clear with no wheezing and good diaphragmatic motion Heart:  S1/S2 no murmur, no rub, gallop or click PMI normal Abdomen: benighn, BS positve, no tenderness, no AAA no bruit.  No HSM or HJR Distal pulses intact with no bruits Plus 2 bilateral  Edema with varicosities  Neuro non-focal Skin warm and dry No muscular weakness   Wt Readings from Last 3 Encounters:  12/20/16 130 lb 12 oz (59.3 kg)  09/27/16 122 lb 9.6 oz (55.6 kg)  08/08/16 120 lb 11.2 oz (54.7 kg)      Studies/Labs Reviewed:   EKG:  01/07/16 SR LBBB 12/20/16 SR LBBB PVC no changes   Recent Labs: 01/05/2016: B Natriuretic Peptide 450.1; TSH 3.156 09/27/2016: ALT 12; HGB 11.4; Platelets 223 12/16/2016: BUN 18; Creatinine, Ser 0.89; NT-Pro BNP 11,829; Potassium 4.0; Sodium 137   Lipid Panel No results found for: CHOL, TRIG, HDL, CHOLHDL, VLDL, LDLCALC, LDLDIRECT  Additional studies/ records that were reviewed today include:   Echo 11/01/16 EF 25-30% mild MR estimated PA 58 mmHg  Cardiac Catheterization: 01/07/16 personally reviewed films   There is moderate to severe left ventricular systolic dysfunction.  The left ventricular ejection fraction is 25-35% by visual estimate.  LV end diastolic pressure is mildly elevated.  Mid RCA lesion, 25 %stenosed.  Ost 2nd Diag to 2nd Diag lesion, 25 %stenosed.  Mid LAD lesion, 40 %stenosed.  Mid Cx to Dist Cx lesion, 25 %stenosed.   Nonischemic cardiomyopathy.  Continue medical therapy.    ASSESSMENT & PLAN:    1. Chronic combined CHF/Lower extremity edema EF worse by echo 11/01/16 25-30% Cath 01/07/16 with no significant CAD Started on demadex  BNP 11,829 12/16/16  Felt fatigued on higher dose ARB she will try To take her coreg at 6:00 pm and 11:00 pm so she is not sleepy with it F/U me in 3 months with BMET/BNP In 3 weeks on new diuretic. Echo in 6 months  -   3.  HTN- Well controlled.  Continue current medications and low sodium Dash type diet.    4. Anemia f/u primary on iron but hurts her stomach. Labs today  Lab Results  Component Value Date   HCT 32.7 (L) 09/27/2016    Kathleen Arias

## 2016-12-19 ENCOUNTER — Telehealth: Payer: Self-pay

## 2016-12-19 DIAGNOSIS — R7989 Other specified abnormal findings of blood chemistry: Secondary | ICD-10-CM

## 2016-12-19 DIAGNOSIS — Z79899 Other long term (current) drug therapy: Secondary | ICD-10-CM

## 2016-12-19 MED ORDER — POTASSIUM CHLORIDE CRYS ER 20 MEQ PO TBCR: 20.0000 meq | EXTENDED_RELEASE_TABLET | Freq: Every day | ORAL | 11 refills | Status: DC

## 2016-12-19 MED ORDER — TORSEMIDE 20 MG PO TABS
20.0000 mg | ORAL_TABLET | Freq: Every day | ORAL | 11 refills | Status: DC
Start: 2016-12-19 — End: 2017-01-13

## 2016-12-19 NOTE — Telephone Encounter (Signed)
-----   Message from [name], MD sent at 12/18/2016  8:45 AM EST ----- BNP elevated stop HCTZ take demedex 20 mg daily and kdur 20 daily f/u with PA next week or so BNP/BMET in 2 weeks after starting demedex

## 2016-12-19 NOTE — Telephone Encounter (Signed)
Called patient's daughter [name] [facility]) about lab results. Per Dr. Johnsie Cancel, BNP elevated stop HCTZ take demedex 20 mg daily and kdur 20 daily f/u with PA next week or so BNP/BMET in 2 weeks after starting demadex. Patient's daughter stated that her mother might have some difficulty changing diuretics. Informed Patient's daughter that Ilda Mori is a stronger diuretic and that she would need to take this to help with her fluid level. Informed daughter that patient would be able to breath better with the change in medications. Daughter had numerous questions and since Dr. Johnsie Cancel is seeing patient tomorrow, informed her that this could be discussed at office visit tomorrow. Patient's daughter agreed.

## 2016-12-20 ENCOUNTER — Ambulatory Visit (INDEPENDENT_AMBULATORY_CARE_PROVIDER_SITE_OTHER): Payer: Medicare Other | Admitting: Cardiovascular Disease

## 2016-12-20 ENCOUNTER — Encounter: Payer: Self-pay | Admitting: Cardiovascular Disease

## 2016-12-20 VITALS — BP 160/90 | HR 71 | Ht 63.0 in | Wt 130.8 lb

## 2016-12-20 DIAGNOSIS — I5042 Chronic combined systolic (congestive) and diastolic (congestive) heart failure: Secondary | ICD-10-CM

## 2016-12-20 DIAGNOSIS — I10 Essential (primary) hypertension: Secondary | ICD-10-CM

## 2016-12-20 NOTE — Patient Instructions (Addendum)
Medication Instructions:  Your physician recommends that you continue on your current medications as directed. Please refer to the Current Medication list given to you today.  Labwork: Your physician recommends that you return for lab work in: 3 weeks for BNP and BMET.  Testing/Procedures: NONE  Follow-Up: Your physician wants you to follow-up in: 3 months with Dr. Johnsie Cancel.   If you need a refill on your cardiac medications before your next appointment, please call your pharmacy.

## 2016-12-27 ENCOUNTER — Other Ambulatory Visit: Payer: Medicare Other

## 2016-12-27 ENCOUNTER — Ambulatory Visit: Payer: Medicare Other | Admitting: Hematology

## 2016-12-28 ENCOUNTER — Telehealth: Payer: Self-pay | Admitting: Hematology

## 2016-12-28 NOTE — Telephone Encounter (Signed)
Spoke with patient's Daughter regarding scheduling message 12/11 and she is OK with appointment D/T.

## 2017-01-11 ENCOUNTER — Other Ambulatory Visit: Payer: Self-pay

## 2017-01-11 ENCOUNTER — Encounter (HOSPITAL_COMMUNITY): Payer: Self-pay | Admitting: Emergency Medicine

## 2017-01-11 ENCOUNTER — Emergency Department (HOSPITAL_COMMUNITY)
Admission: EM | Admit: 2017-01-11 | Discharge: 2017-01-11 | Disposition: A | Discharge status: Home / Self Care | Payer: Medicare Other | Attending: Physician Assistant | Admitting: Physician Assistant

## 2017-01-11 ENCOUNTER — Emergency Department (HOSPITAL_BASED_OUTPATIENT_CLINIC_OR_DEPARTMENT_OTHER): Admit: 2017-01-11 | Discharge: 2017-01-11 | Disposition: A | Discharge status: Home / Self Care | Payer: Medicare Other

## 2017-01-11 DIAGNOSIS — I110 Hypertensive heart disease with heart failure: Secondary | ICD-10-CM | POA: Insufficient documentation

## 2017-01-11 DIAGNOSIS — Z7982 Long term (current) use of aspirin: Secondary | ICD-10-CM | POA: Diagnosis not present

## 2017-01-11 DIAGNOSIS — E079 Disorder of thyroid, unspecified: Secondary | ICD-10-CM | POA: Insufficient documentation

## 2017-01-11 DIAGNOSIS — I509 Heart failure, unspecified: Secondary | ICD-10-CM | POA: Insufficient documentation

## 2017-01-11 DIAGNOSIS — R609 Edema, unspecified: Secondary | ICD-10-CM

## 2017-01-11 DIAGNOSIS — R2241 Localized swelling, mass and lump, right lower limb: Secondary | ICD-10-CM | POA: Diagnosis present

## 2017-01-11 DIAGNOSIS — Z87891 Personal history of nicotine dependence: Secondary | ICD-10-CM | POA: Insufficient documentation

## 2017-01-11 DIAGNOSIS — Z79899 Other long term (current) drug therapy: Secondary | ICD-10-CM | POA: Diagnosis not present

## 2017-01-11 DIAGNOSIS — R600 Localized edema: Secondary | ICD-10-CM | POA: Diagnosis not present

## 2017-01-11 DIAGNOSIS — M7989 Other specified soft tissue disorders: Secondary | ICD-10-CM | POA: Diagnosis not present

## 2017-01-11 LAB — BASIC METABOLIC PANEL
ANION GAP: 9 (ref 5–15)
BUN: 18 mg/dL (ref 6–20)
CHLORIDE: 99 mmol/L — AB (ref 101–111)
CO2: 28 mmol/L (ref 22–32)
Calcium: 8.9 mg/dL (ref 8.9–10.3)
Creatinine, Ser: 0.89 mg/dL (ref 0.44–1.00)
GFR, EST NON AFRICAN AMERICAN: 56 mL/min — AB (ref >60)
Glucose, Bld: 91 mg/dL (ref 65–99)
POTASSIUM: 3.7 mmol/L (ref 3.5–5.1)
SODIUM: 136 mmol/L (ref 135–145)

## 2017-01-11 LAB — CBC WITH DIFFERENTIAL/PLATELET
BASOS ABS: 0 10*3/uL (ref 0.0–0.1)
BASOS PCT: 0 %
EOS ABS: 0.1 10*3/uL (ref 0.0–0.7)
EOS PCT: 1 %
HCT: 32.1 % — ABNORMAL LOW (ref 36.0–46.0)
HEMOGLOBIN: 10.5 g/dL — AB (ref 12.0–15.0)
Lymphocytes Relative: 16 %
Lymphs Abs: 0.9 10*3/uL (ref 0.7–4.0)
MCH: 27 pg (ref 26.0–34.0)
MCHC: 32.7 g/dL (ref 30.0–36.0)
MCV: 82.5 fL (ref 78.0–100.0)
Monocytes Absolute: 0.5 10*3/uL (ref 0.1–1.0)
Monocytes Relative: 9 %
NEUTROS PCT: 74 %
Neutro Abs: 4.2 10*3/uL (ref 1.7–7.7)
PLATELETS: 255 10*3/uL (ref 150–400)
RBC: 3.89 MIL/uL (ref 3.87–5.11)
RDW: 15.8 % — ABNORMAL HIGH (ref 11.5–15.5)
WBC: 5.7 10*3/uL (ref 4.0–10.5)

## 2017-01-11 LAB — BRAIN NATRIURETIC PEPTIDE: B NATRIURETIC PEPTIDE 5: 1496.4 pg/mL — AB (ref 0.0–100.0)

## 2017-01-11 MED ORDER — CEPHALEXIN 500 MG PO CAPS: 500.0000 mg | ORAL_CAPSULE | Freq: Four times a day (QID) | ORAL | 0 refills | Status: DC

## 2017-01-11 NOTE — ED Triage Notes (Signed)
Pt arrives to ED for left leg swelling and knots. Pt has hx of lymphoma, with desolation to her feet. Pulses palpable. Pt has pain to left leg.

## 2017-01-11 NOTE — ED Provider Notes (Signed)
[facility] EMERGENCY DEPARTMENT Provider Note   CSN: [id number] Arrival date & time: 01/11/17  1445     History   Chief Complaint Chief Complaint  Patient presents with  . Circulatory Problem    HPI Kathleen Arias is a 83 y.o. female.  HPI   Patient is an 83 year old female with past mental history significant for congestive heart failure, arthritis, hypertension, thyroid disease.  Patient has EF of around 25%.  She is followed by Dr. Johnsie Cancel.  He saw her in the office 2 weeks ago.  At that time she had an elevated BNP, generous edema bilateral lower extremity's.  She was switched to diuretics.  Patient reports she has been taking it, however when asked to clarify how much and what medications taking patient is really unable to say.  Patient 83 year old and is here with the 2 daughters and the daughters report that she takes her own medications at home.  She is here today because she is concerned about the swelling and wetness of her legs.  She thinks theleft is worse than right.  Patient feels that there is "knots".  Has no shortness of breath.  Past Medical History:  Diagnosis Date  . Arthritis   . Hypertension   . Thyroid disease     Patient Active Problem List   Diagnosis Date Noted  . 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 Counseling regarding advanced care planning and goals of care 05/21/2016  . Iron deficiency anemia 05/16/2016  . Anemia, iron deficiency 01/07/2016  . Normocytic anemia 01/07/2016  . Abnormal stress test   . Chest pain 01/05/2016  . Shortness of breath   . Localized swelling of lower extremity   . Hyponatremia   . Hyperglycemia   . Musculoskeletal pain     Past Surgical History:  Procedure Laterality Date  . CARDIAC CATHETERIZATION N/A 01/07/2016   Procedure: Left Heart Cath and Coronary Angiography;  Surgeon: Jettie Booze, MD;  Location: [facility];  Service: Cardiovascular;  Laterality:  N/A;  . CATARACT EXTRACTION, BILATERAL    . HIP SURGERY      OB History    No data available       Home Medications    Prior to Admission medications   Medication Sig Start Date End Date Taking? Authorizing Provider  aspirin EC 325 MG tablet Take 325 mg by mouth every other day.    [provider]  B COMPLEX-C-CALCIUM PO Take 1 tablet by mouth daily.    [provider]  carvedilol (COREG) 6.25 MG tablet Take 6.25 mg by mouth 2 (two) times daily with a meal.     [provider]  hydrochlorothiazide (HYDRODIURIL) 25 MG tablet Take 1 tablet by mouth daily. 05/10/16   [provider]  levothyroxine (SYNTHROID, LEVOTHROID) 112 MCG tablet Take 112 mcg by mouth See admin instructions. Sun/Tues/Thurs/Sat    [provider]  levothyroxine Wilmer Floor, LEVOTHROID) 125 MCG tablet Take 125 mcg by mouth every Monday, Wednesday, and Friday.  08/21/15   [provider]  losartan (COZAAR) 25 MG tablet Take 12.5 mg by mouth daily.    [provider]  potassium chloride SA (K-DUR,KLOR-CON) 20 MEQ tablet Take 1 tablet (20 mEq total) by mouth daily. 12/19/16   [name], MD  POTASSIUM PO Take 99 mg by mouth 2 (two) times a week.    [provider]  torsemide (DEMADEX) 20 MG tablet Take 1 tablet (20 mg total)  by mouth daily. 12/19/16   [name], MD  vitamin E 1000 UNIT capsule Take 1,000 Units by mouth every other day.    [provider]    Family History Family History  Problem Relation Age of Onset  . Heart disease Mother     Social History Social History   Tobacco Use  . Smoking status: Former Research scientist (life sciences)  . Smokeless tobacco: Never Used  Substance Use Topics  . Alcohol use: No  . Drug use: No     Allergies   Patient has no known allergies.   Review of Systems Review of Systems  Constitutional: Negative for activity change, fatigue and fever.  Respiratory: Negative for cough, chest tightness and  shortness of breath.   Cardiovascular: Positive for leg swelling. Negative for chest pain.  Gastrointestinal: Negative for abdominal pain.     Physical Exam Updated Vital Signs BP (!) 150/67 (BP Location: Left Wrist)   Pulse 71   Temp (!) 97.3 F (36.3 C) (Oral)   Resp 18   SpO2 94%   Physical Exam  Constitutional: She is oriented to person, place, and time. She appears well-developed and well-nourished.  HENT:  Head: Normocephalic and atraumatic.  Eyes: Right eye exhibits no discharge. Left eye exhibits no discharge.  Cardiovascular: Normal rate.  Murmur heard. Pulmonary/Chest: Effort normal and breath sounds normal. She has no wheezes. She has no rales.  Abdominal: Soft. She exhibits no distension. There is no tenderness.  Musculoskeletal:  Bilateral leg legs with edema congested appearing feet, baseline.  Picture available.  Neurological: She is oriented to person, place, and time.  Skin: Skin is warm and dry. She is not diaphoretic.  Psychiatric: She has a normal mood and affect.  Nursing note and vitals reviewed.      ED Treatments / Results  Labs (all labs ordered are listed, but only abnormal results are displayed) Labs Reviewed  CBC WITH DIFFERENTIAL/PLATELET - Abnormal; Notable for the following components:      Result Value   Hemoglobin 10.5 (*)    HCT 32.1 (*)    RDW 15.8 (*)    All other components within normal limits  BASIC METABOLIC PANEL - Abnormal; Notable for the following components:   Chloride 99 (*)    GFR calc non Af Amer 56 (*)    All other components within normal limits  BRAIN NATRIURETIC PEPTIDE    EKG  EKG Interpretation None       Radiology No results found.  Procedures Procedures (including critical care time)  Medications Ordered in ED Medications - No data to display   Initial Impression / Assessment and Plan / ED Course  I have reviewed the triage vital signs and the nursing notes.  Pertinent labs & imaging  results that were available during my care of the patient were reviewed by me and considered in my medical decision making (see chart for details).     Patient is an 83 year old female with past mental history significant for congestive heart failure, arthritis, hypertension, thyroid disease.  Patient has EF of around 25%.  She is followed by Dr. Johnsie Cancel.  He saw her in the office 2 weeks ago.  At that time she had an elevated BNP, generous edema bilateral lower extremity's.  She was switched to diuretics.  Patient reports she has been taking it, however when asked to clarify how much and what medications taking patient is really unable to say.  Patient 83 year old and is here with the 2  daughters and the daughters report that she takes her own medications at home.  She is here today because she is concerned about the swelling and wetness of her legs.  She thinks theleft is worse than right.  Patient feels that there is "knots".  Has no shortness of breath.  6:28 PM Patient's legs appear similar in size to me.   ultrasound ordered.  I do not suspect infection.  I suspect this is heart failure with worsening edema causing stretching and discomfort.  Not convinced that patient is been taking her medications properly.  Will discuss with daughters.  7:58 PM DVT study is negative.  Again I think this is likely just pain and swelling from bilateral edema.  There is no discoloration do not think arterial clot is likely given that she is not having very much pain and she is got good color in her distal extremities.  She will call her cardiologist tomorrow/Friday in order to follow-up regarding treatment for her elevated BNP.  Final Clinical Impressions(s) / ED Diagnoses   Final diagnoses:  None    ED Discharge Orders    None       Macarthur Critchley, MD 01/11/17 [phone]

## 2017-01-11 NOTE — Progress Notes (Signed)
*  Preliminary Results* Left lower extremity venous duplex completed. Left lower extremity is negative for deep vein thrombosis. There is no evidence of left Baker's cyst.  01/11/2017 4:43 PM  Maudry Mayhew, BS, RVT, RDCS, RDMS

## 2017-01-11 NOTE — ED Notes (Signed)
Spoke with Cyrus from main lab to discuss BNP results... Per [place] "I'm pulling it now"

## 2017-01-11 NOTE — ED Notes (Signed)
Called and spoke with Coralyn Mark from the Lab about adding on her BNP

## 2017-01-11 NOTE — Discharge Instructions (Signed)
You were found to have swelling in your legs.  Both legs are swollen.  There is only a tiny bit of redness in the left leg.  If he gets worse you have are now given antibiotics to take.  However we do not want you to take it unless he gets worse.  Please follow-up with your primary care physician and Dr. Florence Canner, your cardiologist.  Come back if you are having any pain in that left leg, any discoloration.

## 2017-01-12 ENCOUNTER — Telehealth: Payer: Self-pay | Admitting: Cardiovascular Disease

## 2017-01-12 NOTE — Telephone Encounter (Signed)
Patient had an appointment tomorrow for BNP and BMET due to medication changes on 12/19/16, stopped HCTZ and started demedex 20 mg daily and Kdur 20 meq daily. Patient went to ED due to BLE edema and had lab work, BNP and BMET, done while there. Cancelled patient's appointment for lab work today. Sent Dr. Johnsie Cancel results. Patient has f/u in March.

## 2017-01-12 NOTE — Telephone Encounter (Signed)
New Message   Daughter Olin Hauser is calling on behalf of mother. Mother is scheduled to come in for labs on 01/13/2017. Patient was just seen in the emergency room on 01/11/2017. They are wondering if the labs that the hospital done would be the same labs that will be done tomorrow. If the labs are the same do they need to come into the office and repeat labs. Please call.

## 2017-01-12 NOTE — Telephone Encounter (Signed)
Have her take her demedex and potassium bid. F/U PA 2 weeks

## 2017-01-13 ENCOUNTER — Other Ambulatory Visit: Payer: Medicare Other

## 2017-01-13 MED ORDER — POTASSIUM CHLORIDE CRYS ER 20 MEQ PO TBCR: 20.0000 meq | EXTENDED_RELEASE_TABLET | Freq: Two times a day (BID) | ORAL | 11 refills | Status: DC

## 2017-01-13 MED ORDER — TORSEMIDE 20 MG PO TABS: 20.0000 mg | ORAL_TABLET | Freq: Two times a day (BID) | ORAL | 11 refills | Status: DC

## 2017-01-13 NOTE — Telephone Encounter (Signed)
Called patient's daughter back with Dr. Kyla Balzarine recommendations. Will make changes to patient's medication list. Patient will see Cecilie Kicks NP on 02/02/17. Patient's daughter verbalized understanding.

## 2017-01-25 NOTE — Progress Notes (Signed)
Kathleen Arias    HEMATOLOGY/ONCOLOGY CLINIC NOTE  Date of Service: 01/31/2017   Patient Care Team: [name], MD as PCP - General (Family Medicine)  CHIEF COMPLAINTS/PURPOSE OF CONSULTATION:  F/u for splenic marginal zone lymphoma.  HISTORY OF PRESENTING ILLNESS:   Kathleen Arias is a wonderful 84 y.o. female who has been referred to [place] by Dr .Redmond Pulling, Jama Flavors, MD for evaluation and management of anemia, leukocytosis and thrombocytopenia.  Patient is a very pleasant female who is here with her daughter. She has a history of hypertension, chronic systolic CHF [ejection fraction of 25-35% based on recent cardiac catheterization on 01/13/2016, echo done showed ejection fraction of 40-45%, nonischemic cardiomyopathy], iron deficiency,  Patient presented to his primary care physician with some dyspnea on exertion which is primarily related to her systolic CHF but she was also noted to have recent blood counts with her primary care physician on 03/15/2016 which showed anemia with a hemoglobin of 9.7 and a hematocrit of 79, platelet count of 137k , WBC count of 11.7k with 7.6k lymphocytes.  Iron studies were done which showed a ferritin of 84 and an iron saturation of 9%. She has been on oral iron replacement by her primary care physician and notes significant constipation and GI intolerance. She reports that she had a FOBT in 12/2015  which was negative.   Patient notes significant fatigue and dyspnea on exertion. Notes weight loss of about 15 pounds since September 2017. Weight is down from 142 to 124lbs.   Patient denies any enlarged lymph nodes, no night sweats.  INTERVAL HISTORY   [name] is here for follow-up along with her daughter for management of splenic marginal zone lymphoma.  She notes she has had lower extremity venous insufficiency issues. Had a visit to the ER and was told she had a doppler which showed only edema. The swelling in her left ankle she feels a hardness in her skin  that goes to the bone and has significant swelling. She denies injury to that area. This has been there about 2 months ago. There has been overall no change since the swelling started. She has been soaking her ankles in epsom salt and RICE. She was given an antibiotics just in case of infection. She has 10 capsules left of it. She also keeps her leg elevated.  She has been taking B Complex and Multivitamin. She takes 1 baby aspirin.   On review of symptoms, pt  Notes bilateral ankle swelling and knot in her left ankle with pain.     MEDICAL HISTORY:  Past Medical History:  Diagnosis Date  . Arthritis   . Hypertension   . Thyroid disease   Chronic systolic CHF Iron deficiency   SURGICAL HISTORY: Past Surgical History:  Procedure Laterality Date  . CARDIAC CATHETERIZATION N/A 01/07/2016   Procedure: Left Heart Cath and Coronary Angiography;  Surgeon: Jettie Booze, MD;  Location: [facility];  Service: Cardiovascular;  Laterality: N/A;  . CATARACT EXTRACTION, BILATERAL    . HIP SURGERY      SOCIAL HISTORY: Social History   Socioeconomic History  . Marital status: Single    Spouse name: Not on file  . Number of children: Not on file  . Years of education: Not on file  . Highest education level: Not on file  Social Needs  . Financial resource strain: Not on file  . Food insecurity - worry: Not on file  . Food insecurity - inability: Not on file  .  Transportation needs - medical: Not on file  . Transportation needs - non-medical: Not on file  Occupational History  . Not on file  Tobacco Use  . Smoking status: Former Research scientist (life sciences)  . Smokeless tobacco: Never Used  Substance and Sexual Activity  . Alcohol use: No  . Drug use: No  . Sexual activity: Not on file  Other Topics Concern  . Not on file  Social History Narrative  . Not on file    FAMILY HISTORY: Family History  Problem Relation Age of Onset  . Heart disease Mother     ALLERGIES:  has No Known  Allergies.  MEDICATIONS:  Current Outpatient Medications  Medication Sig Dispense Refill  . aspirin EC 325 MG tablet Take 325 mg by mouth 2 (two) times daily.     . B COMPLEX-C-CALCIUM PO Take 1 tablet by mouth daily.    . carvedilol (COREG) 6.25 MG tablet Take 6.25 mg by mouth 2 (two) times daily with a meal.     . cephALEXin (KEFLEX) 500 MG capsule Take 1 capsule (500 mg total) by mouth 4 (four) times daily. 20 capsule 0  . levothyroxine (SYNTHROID, LEVOTHROID) 112 MCG tablet Take 112 mcg by mouth See admin instructions. Sun/Tues/Thurs/Sat    . levothyroxine (SYNTHROID, LEVOTHROID) 125 MCG tablet Take 125 mcg by mouth every Monday, Wednesday, and Friday.     . losartan (COZAAR) 25 MG tablet Take 25 mg by mouth daily.     . naproxen sodium (ALEVE) 220 MG tablet Take 220 mg by mouth daily as needed (pain).    Kathleen Arias OVER THE COUNTER MEDICATION Take 2 tablets by mouth daily. VITAFUSION gummies    . potassium chloride SA (K-DUR,KLOR-CON) 20 MEQ tablet Take 1 tablet (20 mEq total) by mouth 2 (two) times daily. 60 tablet 11  . torsemide (DEMADEX) 20 MG tablet Take 1 tablet (20 mg total) by mouth 2 (two) times daily. 60 tablet 11  . Vitamin E 400 units TABS Take 800 Units by mouth daily.      No current facility-administered medications for this visit.     REVIEW OF SYSTEMS:    10 Point review of Systems was done is negative except as noted above.  PHYSICAL EXAMINATION:  ECOG PERFORMANCE STATUS: 2 - Symptomatic, <50% confined to bed  Vitals:   01/31/17 1355  BP: (!) 158/91  Pulse: 82  Resp: 20  Temp: 97.6 F (36.4 C)  TempSrc: Oral  SpO2: 97%  Weight: 128 lb (58.1 kg)  Height: 5\' 3"  (1.6 m)   Body mass index is 22.67 kg/m.  GENERAL:alert, in no acute distress and comfortable SKIN: no acute rashes, no significant lesions EYES: conjunctiva are pink and non-injected, sclera anicteric OROPHARYNX: MMM, no exudates, no oropharyngeal erythema or ulceration NECK: supple, Mild JVD LYMPH:   no palpable lymphadenopathy in the cervical, axillary or inguinal regions LUNGS: clear to auscultation b/l with normal respiratory effort HEART: regular rate & rhythm ABDOMEN:  normoactive bowel sounds , non tender, not distended. no palpable hepatosplenomegaly  Extremity: 1+  pedal edema Bilaterally  PSYCH: alert & oriented x 3 with fluent speech NEURO: no focal motor/sensory deficits  LABORATORY DATA:  I have reviewed the data as listed  . CBC Latest Ref Rng & Units 01/31/2017 01/11/2017 09/27/2016  WBC 3.9 - 10.3 K/uL 5.4 5.7 6.0  Hemoglobin 12.0 - 15.0 g/dL - 10.5(L) 11.4(L)  Hematocrit 34.8 - 46.6 % 32.6(L) 32.1(L) 32.7(L)  Platelets 145 - 400 K/uL 264 255 223   .  CBC    Component Value Date/Time   WBC 5.4 01/31/2017 1333   WBC 5.7 01/11/2017 1500   RBC 4.17 01/31/2017 1333   HGB 10.5 (L) 01/11/2017 1500   HGB 11.4 (L) 09/27/2016 1251   HCT 32.6 (L) 01/31/2017 1333   HCT 32.7 (L) 09/27/2016 1251   PLT 264 01/31/2017 1333   PLT 223 09/27/2016 1251   PLT 118 (L) 05/04/2016 1435   MCV 78.1 (L) 01/31/2017 1333   MCV 84.9 09/27/2016 1251   MCH 26.1 01/31/2017 1333   MCHC 33.4 01/31/2017 1333   RDW 15.2 01/31/2017 1333   RDW 16.7 (H) 09/27/2016 1251   LYMPHSABS 0.9 01/31/2017 1333   LYMPHSABS 1.1 09/27/2016 1251   MONOABS 0.4 01/31/2017 1333   MONOABS 0.5 09/27/2016 1251   EOSABS 0.0 01/31/2017 1333   EOSABS 0.0 09/27/2016 1251   EOSABS 0.1 05/04/2016 1435   BASOSABS 0.0 01/31/2017 1333   BASOSABS 0.0 09/27/2016 1251    . CMP Latest Ref Rng & Units 01/31/2017 01/11/2017 12/16/2016  Glucose 70 - 140 mg/dL 88 91 92  BUN 7 - 26 mg/dL 19 18 18   Creatinine 0.60 - 1.10 mg/dL 0.87 0.89 0.89  Sodium 136 - 145 mmol/L 136 136 137  Potassium 3.3 - 4.7 mmol/L 3.8 3.7 4.0  Chloride 98 - 109 mmol/L 102 99(L) 96  CO2 22 - 29 mmol/L 26 28 25   Calcium 8.4 - 10.4 mg/dL 9.0 8.9 9.0  Total Protein 6.4 - 8.3 g/dL 7.3 - -  Total Bilirubin 0.2 - 1.2 mg/dL 0.7 - -  Alkaline Phos 40  - 150 U/L 94 - -  AST 5 - 34 U/L 23 - -  ALT 0 - 55 U/L 20 - -    Lab Results  Component Value Date   FERRITIN 266 06/15/2016   . Lab Results  Component Value Date   LDH 189 01/31/2017             RADIOGRAPHIC STUDIES: I have personally reviewed the radiological images as listed and agreed with the findings in the report. No results found.  ASSESSMENT & PLAN:   84 yo with   1) Splenic marginal zone lymphoma On presentation patient had anemia, lymphocytosis, thrombocytopenia and significant constitutional symptoms including fatigue, some low-grade fevers and night sweats. Also had abdominal discomfort related to her splenomegaly and anorexia with spleen of about 23cms on PET/CT. Serum kappa lambda free light chain ratio abnormally elevated. Myeloma panel no M spike IFE shows polyclonal gammopathy  Patient is status post Rituxan weekly 4 doses (06/27/16-07/22/16) Thrombocytopenia has resolved, LDH level has normalized.  Hemoglobin improved. Lymphocytosis has resolved. Her constitutional symptoms of resolved. All of these suggest good response to treatment.  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  Hgb stable at 10.9 Thrombocytopenia improved PLT up from 80-90k to 128k and has remained resolved at 264k today. Decreased splenomegaly significantly as per rpt [place] abd on 09/20/16  3) hypothyroidism 4) nonischemic systolic cardiomyopathy - ejection fraction 40-45% on echo. Cardiac catheterization December 2017- nonobstructive coronary artery disease. - EF decreased to 25-30% on 11/01/16 ECHO  5) Rigors and fever related to Rituxan vs Tumor lysis after 1st dose. Has not add any of these issues with adjusted pre-medications with the 2nd dose of Rituxan and is overall feeling much better.  PLAN  - labs and [place] abd results discussed in details with the patient. She appears to have had a good response to single  agent Rituxan  treatment. -No constitutional symptoms, cytopenias stable, LDH is within normal limits and splenomegaly has regressed. -We discussed again the role of maintenance Rituxan. Given her comorbidities and age and based on her preference  we are going to hold off on maintenance Rituxan for now  6) Bilateral ankle swelling with left ankle induration -Doppler negative for DVT PLAN -I recommend she continues to use compression socks, epsom salt bath and continue to RICE -venous stasis dermatitis with possible cellulitis- She will continue her antibiotics until completed.    RTC with Dr [name] in 3 months with labs  All of the patients and her accompanying family's questions were answered with apparent satisfaction. The patient knows to call the clinic with any problems, questions or concerns.  I spent 20 minutes counseling the patient face to face. The total time spent in the appointment was 25 minutes and more than 50% was on counseling and direct patient cares.  This document serves as a record of services personally performed by Sullivan Lone, MD. It was created on his behalf by Joslyn Devon, a trained medical scribe. The creation of this record is based on the scribe's personal observations and the provider's statements to them.    .I have reviewed the above documentation for accuracy and completeness, and I agree with the above.  Sullivan Lone MD [place] AAHIVMS [facility] Hematology/Oncology Physician [facility]  (Office):       [phone] (Work cell):  [phone] (Fax):           [phone]

## 2017-01-31 ENCOUNTER — Inpatient Hospital Stay: Payer: Medicare Other | Attending: Hematology

## 2017-01-31 ENCOUNTER — Encounter: Payer: Self-pay | Admitting: Hematology

## 2017-01-31 ENCOUNTER — Inpatient Hospital Stay (HOSPITAL_BASED_OUTPATIENT_CLINIC_OR_DEPARTMENT_OTHER): Payer: Medicare Other | Admitting: Hematology

## 2017-01-31 VITALS — BP 158/91 | HR 82 | Temp 97.6°F | Resp 20 | Ht 63.0 in | Wt 128.0 lb

## 2017-01-31 DIAGNOSIS — I2510 Atherosclerotic heart disease of native coronary artery without angina pectoris: Secondary | ICD-10-CM | POA: Diagnosis not present

## 2017-01-31 DIAGNOSIS — Z87891 Personal history of nicotine dependence: Secondary | ICD-10-CM | POA: Insufficient documentation

## 2017-01-31 DIAGNOSIS — D696 Thrombocytopenia, unspecified: Secondary | ICD-10-CM

## 2017-01-31 DIAGNOSIS — I429 Cardiomyopathy, unspecified: Secondary | ICD-10-CM | POA: Insufficient documentation

## 2017-01-31 DIAGNOSIS — C8307 Small cell B-cell lymphoma, spleen: Secondary | ICD-10-CM | POA: Diagnosis not present

## 2017-01-31 DIAGNOSIS — I872 Venous insufficiency (chronic) (peripheral): Secondary | ICD-10-CM | POA: Diagnosis not present

## 2017-01-31 DIAGNOSIS — Z79899 Other long term (current) drug therapy: Secondary | ICD-10-CM

## 2017-01-31 DIAGNOSIS — I110 Hypertensive heart disease with heart failure: Secondary | ICD-10-CM | POA: Insufficient documentation

## 2017-01-31 DIAGNOSIS — Z7982 Long term (current) use of aspirin: Secondary | ICD-10-CM | POA: Diagnosis not present

## 2017-01-31 DIAGNOSIS — Z9842 Cataract extraction status, left eye: Secondary | ICD-10-CM | POA: Insufficient documentation

## 2017-01-31 DIAGNOSIS — I5022 Chronic systolic (congestive) heart failure: Secondary | ICD-10-CM | POA: Insufficient documentation

## 2017-01-31 DIAGNOSIS — R161 Splenomegaly, not elsewhere classified: Secondary | ICD-10-CM

## 2017-01-31 DIAGNOSIS — E039 Hypothyroidism, unspecified: Secondary | ICD-10-CM | POA: Diagnosis not present

## 2017-01-31 LAB — COMPREHENSIVE METABOLIC PANEL
ALBUMIN: 3.7 g/dL (ref 3.5–5.0)
ALT: 20 U/L (ref 0–55)
AST: 23 U/L (ref 5–34)
Alkaline Phosphatase: 94 U/L (ref 40–150)
Anion gap: 8 (ref 3–11)
BUN: 19 mg/dL (ref 7–26)
CHLORIDE: 102 mmol/L (ref 98–109)
CO2: 26 mmol/L (ref 22–29)
CREATININE: 0.87 mg/dL (ref 0.60–1.10)
Calcium: 9 mg/dL (ref 8.4–10.4)
GFR calc Af Amer: >60 mL/min (ref >60)
GFR, EST NON AFRICAN AMERICAN: 57 mL/min — AB (ref >60)
GLUCOSE: 88 mg/dL (ref 70–140)
Potassium: 3.8 mmol/L (ref 3.3–4.7)
SODIUM: 136 mmol/L (ref 136–145)
Total Bilirubin: 0.7 mg/dL (ref 0.2–1.2)
Total Protein: 7.3 g/dL (ref 6.4–8.3)

## 2017-01-31 LAB — CBC WITH DIFFERENTIAL (CANCER CENTER ONLY)
Basophils Absolute: 0 10*3/uL (ref 0.0–0.1)
Basophils Relative: 1 %
EOS PCT: 1 %
Eosinophils Absolute: 0 10*3/uL (ref 0.0–0.5)
HEMATOCRIT: 32.6 % — AB (ref 34.8–46.6)
Hemoglobin: 10.9 g/dL — ABNORMAL LOW (ref 11.6–15.9)
LYMPHS ABS: 0.9 10*3/uL (ref 0.9–3.3)
Lymphocytes Relative: 17 %
MCH: 26.1 pg (ref 25.1–34.0)
MCHC: 33.4 g/dL (ref 31.5–36.0)
MCV: 78.1 fL — AB (ref 79.5–101.0)
MONO ABS: 0.4 10*3/uL (ref 0.1–0.9)
MONOS PCT: 7 %
Neutro Abs: 4.1 10*3/uL (ref 1.5–6.5)
Neutrophils Relative %: 74 %
PLATELETS: 264 10*3/uL (ref 145–400)
RBC: 4.17 MIL/uL (ref 3.70–5.45)
RDW: 15.2 % (ref 11.2–16.1)
WBC Count: 5.4 10*3/uL (ref 3.9–10.3)

## 2017-01-31 LAB — LACTATE DEHYDROGENASE: LDH: 189 U/L (ref 125–245)

## 2017-02-02 ENCOUNTER — Ambulatory Visit: Payer: Medicare Other | Admitting: Cardiology

## 2017-02-02 ENCOUNTER — Encounter: Payer: Self-pay | Admitting: Cardiology

## 2017-02-02 VITALS — BP 118/60 | HR 78 | Ht 63.0 in | Wt 125.1 lb

## 2017-02-02 DIAGNOSIS — L03116 Cellulitis of left lower limb: Secondary | ICD-10-CM

## 2017-02-02 DIAGNOSIS — R600 Localized edema: Secondary | ICD-10-CM | POA: Diagnosis not present

## 2017-02-02 DIAGNOSIS — D649 Anemia, unspecified: Secondary | ICD-10-CM | POA: Diagnosis not present

## 2017-02-02 DIAGNOSIS — I10 Essential (primary) hypertension: Secondary | ICD-10-CM

## 2017-02-02 DIAGNOSIS — I5042 Chronic combined systolic (congestive) and diastolic (congestive) heart failure: Secondary | ICD-10-CM

## 2017-02-02 MED ORDER — POTASSIUM CHLORIDE CRYS ER 20 MEQ PO TBCR: 20.0000 meq | EXTENDED_RELEASE_TABLET | Freq: Every day | ORAL | 3 refills | Status: DC

## 2017-02-02 MED ORDER — TORSEMIDE 20 MG PO TABS: 20.0000 mg | ORAL_TABLET | Freq: Every day | ORAL | 3 refills | Status: DC

## 2017-02-02 NOTE — Patient Instructions (Addendum)
Medication Instructions:  1. DECREASE TORSEMIDE TO 20 MG DAILY  2. DECREASE POTASSIUM TO 20 MEQ DAILY  Labwork: NONE ORDERED   Testing/Procedures: NONE ORDERED   Follow-Up: KEEP YOUR UPCOMING APPT ON 03/22/17 WITH DR. Johnsie Cancel AT 2:45 PM   Any Other Special Instructions Will Be Listed Below (If Applicable).     If you need a refill on your cardiac medications before your next appointment, please call your pharmacy.

## 2017-02-02 NOTE — Progress Notes (Signed)
Cardiology Office Note   Date:  02/02/2017   ID:  Kathleen Arias, DOB Dec 27, 1925, MRN [id number]  PCP:  [name], MD  Cardiologist:  Dr. Johnsie Cancel    Chief Complaint  Patient presents with  . Hospitalization Follow-up      History of Present Illness: Kathleen Arias is a 84 y.o. female who presents for edema neg DVT post hospitalization 01/11/17.  Past medical history significant for congestive heart failure, arthritis, hypertension, thyroid disease.  Patient has EF of around 25%, LBBB,   Seen [facility] 01/05/16 with  chest pain. EKG showed LBBB. Echo showed EF 40-45% inferior hypokinesis ? Ischemic moderate MR grade 2 diastolic dysfunction. BNP minimally elevated to 450. Given IV lasix,. Myoview high risk with TID and infarct/ischemia in multiple regions. Even new onset CHF, LBBB and abnormal Myoview in May for cardiac catheterization for definite evaluation of coronary anatomy. Cath showed mild nonobstructive CAD.? Takatsubo DCM. Discharged on BB and ACE.   F/U echo 11/01/16 EF 25-30% didn't like lasix and HCTZ not strong Enough. BNP over 11,000 November 2018 started on demedex yesterday has not started Likes salt.  Today she presents due to edema of her legs. Occurred despite demadex, she has deceased salt since seen by Dr. Johnsie Cancel.  He increased her demadex to BID, but she became too weak so she only took half a pill yesterday and today after bacon she has some edema.  Also her Lt shin is slightly warm and pink. Reviewed venous doppler.  No chest pain and no SOB.      Past Medical History:  Diagnosis Date  . Arthritis   . Hypertension   . Thyroid disease     Past Surgical History:  Procedure Laterality Date  . CARDIAC CATHETERIZATION N/A 01/07/2016   Procedure: Left Heart Cath and Coronary Angiography;  Surgeon: Jettie Booze, MD;  Location: [facility];  Service: Cardiovascular;  Laterality: N/A;  . CATARACT EXTRACTION, BILATERAL    . HIP SURGERY        Current Outpatient Medications  Medication Sig Dispense Refill  . aspirin EC 325 MG tablet Take 325 mg by mouth 2 (two) times daily.     . B COMPLEX-C-CALCIUM PO Take 1 tablet by mouth daily.    . carvedilol (COREG) 6.25 MG tablet Take 6.25 mg by mouth 2 (two) times daily with a meal.     . cephALEXin (KEFLEX) 500 MG capsule Take 1 capsule (500 mg total) by mouth 4 (four) times daily. 20 capsule 0  . levothyroxine (SYNTHROID, LEVOTHROID) 112 MCG tablet Take 112 mcg by mouth See admin instructions. Sun/Tues/Thurs/Sat    . levothyroxine (SYNTHROID, LEVOTHROID) 125 MCG tablet Take 125 mcg by mouth every Monday, Wednesday, and Friday.     . losartan (COZAAR) 25 MG tablet Take 25 mg by mouth daily.     . naproxen sodium (ALEVE) 220 MG tablet Take 220 mg by mouth daily as needed (pain).    Marland Kitchen OVER THE COUNTER MEDICATION Take 2 tablets by mouth daily. VITAFUSION gummies    . Vitamin E 400 units TABS Take 800 Units by mouth daily.     . potassium chloride SA (K-DUR,KLOR-CON) 20 MEQ tablet Take 1 tablet (20 mEq total) by mouth daily. 90 tablet 3  . torsemide (DEMADEX) 20 MG tablet Take 1 tablet (20 mg total) by mouth daily. 90 tablet 3   No current facility-administered medications for this visit.     Allergies:   Patient has no  known allergies.    Social History:  The patient  reports that she has quit smoking. she has never used smokeless tobacco. She reports that she does not drink alcohol or use drugs.   Family History:  The patient's family history includes Heart disease in her mother.    ROS:  General:no colds or fevers, no weight changes Skin:no rashes or ulcers HEENT:no blurred vision, no congestion CV:see HPI PUL:see HPI GI:no diarrhea constipation or melena, no indigestion GU:no hematuria, no dysuria MS:no joint pain, no claudication Neuro:no syncope, no lightheadedness Endo:no diabetes, + thyroid disease  Wt Readings from Last 3 Encounters:  02/02/17 125 lb 1.9 oz (56.8  kg)  01/31/17 128 lb (58.1 kg)  12/20/16 130 lb 12 oz (59.3 kg)     PHYSICAL EXAM: VS:  BP 118/60   Pulse 78   Ht 5\' 3"  (1.6 m)   Wt 125 lb 1.9 oz (56.8 kg)   SpO2 98%   BMI 22.16 kg/m  , BMI Body mass index is 22.16 kg/m. General:Pleasant affect, NAD Skin:Warm and dry, brisk capillary refill HEENT:normocephalic, sclera clear, mucus membranes moist Neck:supple, no JVD, no bruits  Heart:S1S2 RRR without murmur, gallup, rub or click Lungs:clear without rales, rhonchi, or wheezes KGU:RKYH, non tender, + BS, do not palpate liver spleen or masses Ext:1+ lower ext edema, Lt shin with mild erythema  2+ pedal pulses, 2+ radial pulses Neuro:alert and oriented X 3, MAE, follows commands, + facial symmetry    EKG:  EKG is NOT ordered today. The ekg from ER is reviewed and no acute changes, SR with PVCs LBBB  Recent Labs: 12/16/2016: NT-Pro BNP 11,829 01/11/2017: B Natriuretic Peptide 1,496.4; Hemoglobin 10.5 01/31/2017: ALT 20; BUN 19; Creatinine, Ser 0.87; Platelet Count 264; Potassium 3.8; Sodium 136    Lipid Panel No results found for: CHOL, TRIG, HDL, CHOLHDL, VLDL, LDLCALC, LDLDIRECT     Other studies Reviewed: Additional studies/ records that were reviewed today include: . Echo 11/01/16 Study Conclusions  - Left ventricle: The cavity size was normal. Wall thickness was   normal. Systolic function was severely reduced. The estimated   ejection fraction was in the range of 25% to 30%. Features are   consistent with a pseudonormal left ventricular filling pattern,   with concomitant abnormal relaxation and increased filling   pressure (grade 2 diastolic dysfunction). - Aortic valve: Mildly calcified annulus. Normal thickness   leaflets. There was mild regurgitation. - Mitral valve: There was mild regurgitation. - Left atrium: The atrium was moderately dilated. - Pulmonary arteries: Systolic pressure was moderately increased.   PA peak pressure: 58 mm Hg (S).  Cath  01/07/16 Procedures   Left Heart Cath and Coronary Angiography  Conclusion     There is moderate to severe left ventricular systolic dysfunction.  The left ventricular ejection fraction is 25-35% by visual estimate.  LV end diastolic pressure is mildly elevated.  Mid RCA lesion, 25 %stenosed.  Ost 2nd Diag to 2nd Diag lesion, 25 %stenosed.  Mid LAD lesion, 40 %stenosed.  Mid Cx to Dist Cx lesion, 25 %stenosed.   Nonischemic cardiomyopathy.  Continue medical therapy.     ASSESSMENT AND PLAN:  1.  Lower ext edema will leave on demadex 20 mg daily and if wt up 3 lbs in a day or 5 lbs in a week she will take extra demadex.  Follow up with Dr. Johnsie Cancel in March.  Recent labs in ER and oncology were normal.  Neg DVT  2.   Chronic  combined systolic and diastolic HF see above.  3.   HTN controlled  4.   Cellulitis on Lt shin, on keflex 500 QID she will complete.  5.   Anemia per PCP most recent hgb 10.9   Current medicines are reviewed with the patient today.  The patient Has no concerns regarding medicines.  The following changes have been made:  See above Labs/ tests ordered today include:see above  Disposition:   FU:  see above  Signed, Cecilie Kicks, NP  02/02/2017 5:15 PM    [facility] [place], [place], [place] Phone: [phone]; Fax: [phone]

## 2017-02-21 ENCOUNTER — Ambulatory Visit: Payer: Medicare Other | Admitting: Vascular Surgery

## 2017-02-21 ENCOUNTER — Encounter: Payer: Self-pay | Admitting: Vascular Surgery

## 2017-02-21 VITALS — BP 163/85 | HR 88 | Temp 97.0°F | Resp 18 | Ht 62.5 in | Wt 127.0 lb

## 2017-02-21 DIAGNOSIS — R600 Localized edema: Secondary | ICD-10-CM | POA: Insufficient documentation

## 2017-02-21 NOTE — Progress Notes (Signed)
Subjective:     Patient ID: Kathleen Arias, female   DOB: 1925/07/31, 84 y.o.   MRN: [id number]  HPI This 84 year old female referred by Dr. Kathryne Eriksson for evaluation of swelling in the left legs with possible thrombophlebitis. The patient states that she developed tenderness in the left posterior medial calf about 5 or 6 weeks ago. She has no history of DVT thrombophlebitis stasis ulcers or bleeding. She does have chronic swelling in both legs due to congestive heart failure and is followed by Dr. Johnsie Cancel She had an ultrasound performed in the emergency department which revealed no evidence of DVT in the left leg with no, at regarding her superficial venous system. She has been treated with 1 course of Keflex 500 mg 4 times a day. Her symptoms seem to have remained stable. She has diagnosis of cardiomyopathy with chronic systolic and diastolic heart failure.  Past Medical History:  Diagnosis Date  . Arthritis   . Hypertension   . Thyroid disease     Social History   Tobacco Use  . Smoking status: Former Research scientist (life sciences)  . Smokeless tobacco: Never Used  Substance Use Topics  . Alcohol use: No    Family History  Problem Relation Age of Onset  . Heart disease Mother     No Known Allergies   Current Outpatient Medications:  .  aspirin EC 325 MG tablet, Take 325 mg by mouth 2 (two) times daily. , Disp: , Rfl:  .  B COMPLEX-C-CALCIUM PO, Take 1 tablet by mouth daily., Disp: , Rfl:  .  carvedilol (COREG) 6.25 MG tablet, Take 6.25 mg by mouth 2 (two) times daily with a meal. , Disp: , Rfl:  .  levothyroxine (SYNTHROID, LEVOTHROID) 112 MCG tablet, Take 112 mcg by mouth See admin instructions. Sun/Tues/Thurs/Sat, Disp: , Rfl:  .  levothyroxine (SYNTHROID, LEVOTHROID) 125 MCG tablet, Take 125 mcg by mouth every Monday, Wednesday, and Friday. , Disp: , Rfl:  .  losartan (COZAAR) 25 MG tablet, Take 25 mg by mouth daily. , Disp: , Rfl:  .  naproxen sodium (ALEVE) 220 MG tablet, Take 220 mg by mouth  daily as needed (pain)., Disp: , Rfl:  .  OVER THE COUNTER MEDICATION, Take 2 tablets by mouth daily. VITAFUSION gummies, Disp: , Rfl:  .  potassium chloride SA (K-DUR,KLOR-CON) 20 MEQ tablet, Take 1 tablet (20 mEq total) by mouth daily., Disp: 90 tablet, Rfl: 3 .  torsemide (DEMADEX) 20 MG tablet, Take 1 tablet (20 mg total) by mouth daily., Disp: 90 tablet, Rfl: 3 .  Vitamin E 400 units TABS, Take 800 Units by mouth daily. , Disp: , Rfl:  .  cephALEXin (KEFLEX) 500 MG capsule, Take 1 capsule (500 mg total) by mouth 4 (four) times daily. (Patient not taking: Reported on 02/21/2017), Disp: 20 capsule, Rfl: 0  Vitals:   02/21/17 1154 02/21/17 1203  BP: (!) 171/90 (!) 163/85  Pulse: 88   Resp: 18   Temp: (!) 97 F (36.1 C)   TempSrc: Oral   SpO2: 96%   Weight: 127 lb (57.6 kg)   Height: 5' 2.5" (1.588 m)     Body mass index is 22.86 kg/m.            dReview of Systems Denies chest pain, orthopnea, hemoptysis. Does have dyspnea on exertion.    Objective:   Physical Exam BP (!) 163/85 (BP Location: Left Arm, Patient Position: Sitting, Cuff Size: Normal)   Pulse 88   Temp (!) 97 F (  36.1 C) (Oral)   Resp 18   Ht 5' 2.5" (1.588 m)   Wt 127 lb (57.6 kg)   SpO2 96%   BMI 22.86 kg/m     Gen.-alert and oriented x3 in no apparent distress-spry for age 19 normal for age Lungs no rhonchi or wheezing Cardiovascular regular rhythm no murmurs carotid pulses 3+ palpable no bruits audible Abdomen soft nontender no palpable masses Musculoskeletal free of  major deformities Skin clear -no rashes Neurologic normal Lower extremities 3+ femoral and dorsalis pedis pulses palpable bilaterally with no edema on right trace edema on left Both feet well perfused Prominent veins in the pretibial region bilaterally but no bulging varicosities noted. No hyperpigmentation or ulceration noted distally. Tenderness in the left medial distal calf posterior to great saphenous vein and some  discoloration of skin over the anterior compartment anterolaterally left leg.  Today I reviewed the previous ultrasound performed 01/11/2017 also performed a bedside SonoSite ultrasound exam I visualized the left great saphenous vein from the saphenofemoral junction to the medial malleolus and there is no evidence of thrombus and it was normal in size with no reflux      Assessment:     #1 the area of tenderness and inflammation is posterior to the great saphenous vein and does not appear to involve the venous system #2 no evidence of DVT on recent ultrasound exam #3 patient has diagnosis of lymphoma #4 inflammation in posterior medial left leg-etiology unknown-does not appear to be related to the arterial or venous system    Plan:     #1 would treat with heat and anti-inflammatory medications such as ibuprofen #2 consider 10-14 day course of antibiotic although not treating any specific infection   #3 no indication for any vascular intervention suggested that patient follow-up with her medical doctor Dr. Redmond Pulling for decision regarding antibiotics

## 2017-03-22 ENCOUNTER — Ambulatory Visit: Payer: Medicare Other | Admitting: Cardiovascular Disease

## 2017-04-14 ENCOUNTER — Telehealth: Payer: Self-pay

## 2017-04-14 NOTE — Telephone Encounter (Signed)
Pt daughter, Kathleen Arias, called regarding pt recent cold that her mother "has not felt the same since it ended." Pt experienced severe anemia last year and daughter verbalized, "she is expressing the same symptoms as last year and we just want to check her labs to make sure she is not anemic again." Pt is also feeling increased fatigue. Discussed with Dr. [name], and okay to see pt sooner for labs and f/u. Pt rescheduled for 04/18/17 at 2:15pm for labs and 2:40pm for doctor visit. 05/18/17 appt cancelled. Pt daughter verbalized understanding of changes. Note added that lab work scheduled for 05/01/17 okay to be used on 04/18/17.

## 2017-04-18 ENCOUNTER — Inpatient Hospital Stay: Payer: Medicare Other | Attending: Hematology | Admitting: Hematology

## 2017-04-18 ENCOUNTER — Telehealth: Payer: Self-pay | Admitting: Hematology

## 2017-04-18 ENCOUNTER — Inpatient Hospital Stay: Payer: Medicare Other

## 2017-04-18 ENCOUNTER — Encounter: Payer: Self-pay | Admitting: Hematology

## 2017-04-18 VITALS — BP 155/88 | HR 79 | Temp 97.5°F | Resp 16 | Ht 62.5 in | Wt 124.4 lb

## 2017-04-18 DIAGNOSIS — D509 Iron deficiency anemia, unspecified: Secondary | ICD-10-CM

## 2017-04-18 DIAGNOSIS — R509 Fever, unspecified: Secondary | ICD-10-CM | POA: Insufficient documentation

## 2017-04-18 DIAGNOSIS — I872 Venous insufficiency (chronic) (peripheral): Secondary | ICD-10-CM | POA: Insufficient documentation

## 2017-04-18 DIAGNOSIS — Z79899 Other long term (current) drug therapy: Secondary | ICD-10-CM

## 2017-04-18 DIAGNOSIS — D649 Anemia, unspecified: Secondary | ICD-10-CM | POA: Diagnosis not present

## 2017-04-18 DIAGNOSIS — D72820 Lymphocytosis (symptomatic): Secondary | ICD-10-CM | POA: Diagnosis not present

## 2017-04-18 DIAGNOSIS — R161 Splenomegaly, not elsewhere classified: Secondary | ICD-10-CM | POA: Diagnosis not present

## 2017-04-18 DIAGNOSIS — C8307 Small cell B-cell lymphoma, spleen: Secondary | ICD-10-CM | POA: Diagnosis not present

## 2017-04-18 DIAGNOSIS — I429 Cardiomyopathy, unspecified: Secondary | ICD-10-CM | POA: Diagnosis not present

## 2017-04-18 DIAGNOSIS — E039 Hypothyroidism, unspecified: Secondary | ICD-10-CM | POA: Insufficient documentation

## 2017-04-18 DIAGNOSIS — I2510 Atherosclerotic heart disease of native coronary artery without angina pectoris: Secondary | ICD-10-CM | POA: Insufficient documentation

## 2017-04-18 DIAGNOSIS — K5900 Constipation, unspecified: Secondary | ICD-10-CM | POA: Insufficient documentation

## 2017-04-18 DIAGNOSIS — I110 Hypertensive heart disease with heart failure: Secondary | ICD-10-CM | POA: Insufficient documentation

## 2017-04-18 DIAGNOSIS — Z9842 Cataract extraction status, left eye: Secondary | ICD-10-CM | POA: Insufficient documentation

## 2017-04-18 DIAGNOSIS — Z7982 Long term (current) use of aspirin: Secondary | ICD-10-CM | POA: Diagnosis not present

## 2017-04-18 DIAGNOSIS — Z87891 Personal history of nicotine dependence: Secondary | ICD-10-CM | POA: Insufficient documentation

## 2017-04-18 DIAGNOSIS — I5022 Chronic systolic (congestive) heart failure: Secondary | ICD-10-CM | POA: Diagnosis not present

## 2017-04-18 LAB — RETICULOCYTES
RBC.: 4.04 MIL/uL (ref 3.70–5.45)
Retic Count, Absolute: 80.8 10*3/uL (ref 33.7–90.7)
Retic Ct Pct: 2 % (ref 0.7–2.1)

## 2017-04-18 LAB — CMP (CANCER CENTER ONLY)
ALT: 12 U/L (ref 0–55)
AST: 19 U/L (ref 5–34)
Albumin: 3.7 g/dL (ref 3.5–5.0)
Alkaline Phosphatase: 96 U/L (ref 40–150)
Anion gap: 8 (ref 3–11)
BUN: 19 mg/dL (ref 7–26)
CO2: 29 mmol/L (ref 22–29)
Calcium: 9.5 mg/dL (ref 8.4–10.4)
Chloride: 99 mmol/L (ref 98–109)
Creatinine: 0.97 mg/dL (ref 0.60–1.10)
GFR, Est AFR Am: 58 mL/min — ABNORMAL LOW
GFR, Estimated: 50 mL/min — ABNORMAL LOW
Glucose, Bld: 91 mg/dL (ref 70–140)
Potassium: 4 mmol/L (ref 3.5–5.1)
Sodium: 136 mmol/L (ref 136–145)
Total Bilirubin: 0.7 mg/dL (ref 0.2–1.2)
Total Protein: 7.8 g/dL (ref 6.4–8.3)

## 2017-04-18 LAB — CBC WITH DIFFERENTIAL (CANCER CENTER ONLY)
BASOS ABS: 0 10*3/uL (ref 0.0–0.1)
BASOS PCT: 0 %
Eosinophils Absolute: 0.1 10*3/uL (ref 0.0–0.5)
Eosinophils Relative: 1 %
HEMATOCRIT: 32.2 % — AB (ref 34.8–46.6)
HEMOGLOBIN: 10.2 g/dL — AB (ref 11.6–15.9)
LYMPHS PCT: 17 %
Lymphs Abs: 1 10*3/uL (ref 0.9–3.3)
MCH: 25.2 pg (ref 25.1–34.0)
MCHC: 31.7 g/dL (ref 31.5–36.0)
MCV: 79.7 fL (ref 79.5–101.0)
MONO ABS: 1.1 10*3/uL — AB (ref 0.1–0.9)
MONOS PCT: 19 %
NEUTROS ABS: 3.8 10*3/uL (ref 1.5–6.5)
NEUTROS PCT: 63 %
Platelet Count: 225 10*3/uL (ref 145–400)
RBC: 4.04 MIL/uL (ref 3.70–5.45)
RDW: 16.3 % — AB (ref 11.2–14.5)
WBC Count: 5.9 10*3/uL (ref 3.9–10.3)

## 2017-04-18 LAB — LACTATE DEHYDROGENASE: LDH: 198 U/L (ref 125–245)

## 2017-04-18 NOTE — Telephone Encounter (Signed)
Scheduled appt per 4/2 los - Gave patient AVS and calender per los.  

## 2017-04-18 NOTE — Progress Notes (Signed)
Marland Kitchen    HEMATOLOGY/ONCOLOGY CLINIC NOTE  Date of Service: 04/18/2017   Patient Care Team: [name], MD as PCP - General (Family Medicine) [name], MD as PCP - Cardiology (Cardiology)  CHIEF COMPLAINTS/PURPOSE OF CONSULTATION:  F/u for splenic marginal zone lymphoma  HISTORY OF PRESENTING ILLNESS:   Kathleen Arias is a wonderful 84 y.o. female who has been referred to [place] by Dr .Redmond Pulling, Jama Flavors, MD for evaluation and management of anemia, leukocytosis and thrombocytopenia.  Patient is a very pleasant female who is here with her daughter. She has a history of hypertension, chronic systolic CHF [ejection fraction of 25-35% based on recent cardiac catheterization on 01/13/2016, echo done showed ejection fraction of 40-45%, nonischemic cardiomyopathy], iron deficiency,  Patient presented to his primary care physician with some dyspnea on exertion which is primarily related to her systolic CHF but she was also noted to have recent blood counts with her primary care physician on 03/15/2016 which showed anemia with a hemoglobin of 9.7 and a hematocrit of 79, platelet count of 137k , WBC count of 11.7k with 7.6k lymphocytes.  Iron studies were done which showed a ferritin of 84 and an iron saturation of 9%. She has been on oral iron replacement by her primary care physician and notes significant constipation and GI intolerance. She reports that she had a FOBT in 12/2015  which was negative.   Patient notes significant fatigue and dyspnea on exertion. Notes weight loss of about 15 pounds since September 2017. Weight is down from 142 to 124lbs.   Patient denies any enlarged lymph nodes, no night sweats.  INTERVAL HISTORY   Kathleen Arias is here for follow-up along with her daughter for management of splenic marginal zone lymphoma. She notes 3 weeks ago she had a cold or possible flu that lasted for 3 weeks. She has not gained her baseline strength back since. She did not go to doctors to be  diagnosed with the flu. She did take antibiotics. She notes she will get pain in her abdomen over her spleen after eating large meals. She has been eating smaller meals so she does not have discomfort.  She has not seen her endocrinologist in a while about her thyroid levels. She noes she lives alone and is able to function at home. She has not driven her car around in a year, but she has family support and help with getting around and getting meals.   On review of symptoms, pt no longer having cough or cold symptoms. She eats smaller meals and has smaller appetite since her cold. She has been more active the last few days. She denies current fever, chills or night sweats.     MEDICAL HISTORY:  Past Medical History:  Diagnosis Date  . Arthritis   . Hypertension   . Thyroid disease   Chronic systolic CHF Iron deficiency   SURGICAL HISTORY: Past Surgical History:  Procedure Laterality Date  . CARDIAC CATHETERIZATION N/A 01/07/2016   Procedure: Left Heart Cath and Coronary Angiography;  Surgeon: Jettie Booze, MD;  Location: [facility];  Service: Cardiovascular;  Laterality: N/A;  . CATARACT EXTRACTION, BILATERAL    . HIP SURGERY      SOCIAL HISTORY: Social History   Socioeconomic History  . Marital status: Single    Spouse name: Not on file  . Number of children: Not on file  . Years of education: Not on file  . Highest education level: Not on file  Occupational History  . Not on file  Social Needs  . Financial resource strain: Not on file  . Food insecurity:    Worry: Not on file    Inability: Not on file  . Transportation needs:    Medical: Not on file    Non-medical: Not on file  Tobacco Use  . Smoking status: Former Research scientist (life sciences)  . Smokeless tobacco: Never Used  Substance and Sexual Activity  . Alcohol use: No  . Drug use: No  . Sexual activity: Not on file  Lifestyle  . Physical activity:    Days per week: Not on file    Minutes per session: Not on  file  . Stress: Not on file  Relationships  . Social connections:    Talks on phone: Not on file    Gets together: Not on file    Attends religious service: Not on file    Active member of club or organization: Not on file    Attends meetings of clubs or organizations: Not on file    Relationship status: Not on file  . Intimate partner violence:    Fear of current or ex partner: Not on file    Emotionally abused: Not on file    Physically abused: Not on file    Forced sexual activity: Not on file  Other Topics Concern  . Not on file  Social History Narrative  . Not on file    FAMILY HISTORY: Family History  Problem Relation Age of Onset  . Heart disease Mother     ALLERGIES:  has No Known Allergies.  MEDICATIONS:  Current Outpatient Medications  Medication Sig Dispense Refill  . aspirin EC 325 MG tablet Take 325 mg by mouth 2 (two) times daily.     . B COMPLEX-C-CALCIUM PO Take 1 tablet by mouth daily.    . carvedilol (COREG) 6.25 MG tablet Take 6.25 mg by mouth 2 (two) times daily with a meal.     . levothyroxine (SYNTHROID, LEVOTHROID) 112 MCG tablet Take 112 mcg by mouth See admin instructions. Sun/Tues/Thurs/Sat    . levothyroxine (SYNTHROID, LEVOTHROID) 125 MCG tablet Take 125 mcg by mouth every Monday, Wednesday, and Friday.     . naproxen sodium (ALEVE) 220 MG tablet Take 220 mg by mouth daily as needed (pain).    Marland Kitchen OVER THE COUNTER MEDICATION Take 2 tablets by mouth daily. VITAFUSION gummies    . potassium chloride SA (K-DUR,KLOR-CON) 20 MEQ tablet Take 1 tablet (20 mEq total) by mouth daily. 90 tablet 3  . torsemide (DEMADEX) 20 MG tablet Take 1 tablet (20 mg total) by mouth daily. 90 tablet 3  . Vitamin E 400 units TABS Take 800 Units by mouth daily.     Marland Kitchen losartan (COZAAR) 25 MG tablet Take 1 tablet (25 mg total) by mouth daily. 90 tablet 1   No current facility-administered medications for this visit.     REVIEW OF SYSTEMS:    .10 Point review of Systems  was done is negative except as noted above.  PHYSICAL EXAMINATION:  ECOG PERFORMANCE STATUS: 2 - Symptomatic, <50% confined to bed  Vitals:   04/18/17 1459  BP: (!) 155/88  Pulse: 79  Resp: 16  Temp: (!) 97.5 F (36.4 C)  TempSrc: Oral  SpO2: 96%  Weight: 124 lb 6.4 oz (56.4 kg)  Height: 5' 2.5" (1.588 m)   Body mass index is 22.39 kg/m. Marland Kitchen GENERAL:alert, in no acute distress and comfortable SKIN: no acute rashes, no  significant lesions EYES: conjunctiva are pink and non-injected, sclera anicteric OROPHARYNX: MMM, no exudates, no oropharyngeal erythema or ulceration NECK: supple, no JVD LYMPH:  no palpable lymphadenopathy in the cervical, axillary or inguinal regions LUNGS: clear to auscultation b/l with normal respiratory effort HEART: regular rate & rhythm ABDOMEN:  normoactive bowel sounds , non tender, not distended. Extremity: no pedal edema PSYCH: alert & oriented x 3 with fluent speech NEURO: no focal motor/sensory deficits   LABORATORY DATA:  I have reviewed the data as listed  . CBC Latest Ref Rng & Units 04/18/2017 01/31/2017 01/11/2017  WBC 3.9 - 10.3 K/uL 5.9 5.4 5.7  Hemoglobin 12.0 - 15.0 g/dL - - 10.5(L)  Hematocrit 34.8 - 46.6 % 32.2(L) 32.6(L) 32.1(L)  Platelets 145 - 400 K/uL 225 264 255  HGB 10.2 . CBC    Component Value Date/Time   WBC 5.9 04/18/2017 1440   WBC 5.7 01/11/2017 1500   RBC 4.04 04/18/2017 1440   RBC 4.04 04/18/2017 1440   HGB 10.5 (L) 01/11/2017 1500   HGB 11.4 (L) 09/27/2016 1251   HCT 32.2 (L) 04/18/2017 1440   HCT 32.7 (L) 09/27/2016 1251   PLT 225 04/18/2017 1440   PLT 223 09/27/2016 1251   PLT 118 (L) 05/04/2016 1435   MCV 79.7 04/18/2017 1440   MCV 84.9 09/27/2016 1251   MCH 25.2 04/18/2017 1440   MCHC 31.7 04/18/2017 1440   RDW 16.3 (H) 04/18/2017 1440   RDW 16.7 (H) 09/27/2016 1251   LYMPHSABS 1.0 04/18/2017 1440   LYMPHSABS 1.1 09/27/2016 1251   MONOABS 1.1 (H) 04/18/2017 1440   MONOABS 0.5 09/27/2016 1251    EOSABS 0.1 04/18/2017 1440   EOSABS 0.0 09/27/2016 1251   EOSABS 0.1 05/04/2016 1435   BASOSABS 0.0 04/18/2017 1440   BASOSABS 0.0 09/27/2016 1251    . CMP Latest Ref Rng & Units 04/18/2017 01/31/2017 01/11/2017  Glucose 70 - 140 mg/dL 91 88 91  BUN 7 - 26 mg/dL 19 19 18   Creatinine 0.60 - 1.10 mg/dL 0.97 0.87 0.89  Sodium 136 - 145 mmol/L 136 136 136  Potassium 3.5 - 5.1 mmol/L 4.0 3.8 3.7  Chloride 98 - 109 mmol/L 99 102 99(L)  CO2 22 - 29 mmol/L 29 26 28   Calcium 8.4 - 10.4 mg/dL 9.5 9.0 8.9  Total Protein 6.4 - 8.3 g/dL 7.8 7.3 -  Total Bilirubin 0.2 - 1.2 mg/dL 0.7 0.7 -  Alkaline Phos 40 - 150 U/L 96 94 -  AST 5 - 34 U/L 19 23 -  ALT 0 - 55 U/L 12 20 -    Lab Results  Component Value Date   FERRITIN 81 04/18/2017   . Lab Results  Component Value Date   LDH 198 04/18/2017            RADIOGRAPHIC STUDIES: I have personally reviewed the radiological images as listed and agreed with the findings in the report. No results found.  ASSESSMENT & PLAN:   84 yo with   1) Splenic marginal zone lymphoma On presentation patient had anemia, lymphocytosis, thrombocytopenia and significant constitutional symptoms including fatigue, some low-grade fevers and night sweats. Also had abdominal discomfort related to her splenomegaly and anorexia with spleen of about 23cms on PET/CT. Serum kappa lambda free light chain ratio abnormally elevated. Myeloma panel no M spike IFE shows polyclonal gammopathy  Patient is status post Rituxan weekly 4 doses (06/27/16-07/22/16)  2) Anemia and thrombocytopenia likely related to splenic marginal zone lymphoma and hypersplenism.  Hgb stable at 10.2 Thrombocytopenia improved PLT up from 80-90k to 128k and has remained resolved at 225k today.  Decreased splenomegaly significantly as per rpt [place] abd on 09/20/16  3) hypothyroidism- on levothyroxine  4) nonischemic systolic cardiomyopathy -  Cardiac catheterization December 2017- nonobstructive  coronary artery disease. - EF decreased to 25-30% on 11/01/16 ECHO  5) Rigors and fever related to Rituxan vs Tumor lysis after 1st dose. Has not had any of these issues with adjusted pre-medications with the 2nd dose of Rituxan and is overall feeling much better. -Resolved  PLAN  - labs discussed in details with the patient. Her Lymphocytes and plt are still normal. Anemia stable with smaller RBC size.  -Will did iron study today to check iron levels - ferritin level at 81 -She will start taking slow FE with her meals.  -Her spleen is barely palpable now after Rituxan treatment.  -Her fatigue is likely related to recent cold/virus, she is improving in energy and appetite lately.  -We previously discussed the role of maintenance Rituxan. Given her comorbidities and age and based on her preference  we are going to hold off on maintenance Rituxan for now. -no other overt clinical or lab evidence of lymphoma progression at this time. -would recommend yearly flu shots -she should also get Prevnar and Pneumovax if these havent been received with PCP in the last 5 yrs. Will discussed on next visit again after recent respiration infection resolved.   6) Bilateral ankle swelling with left ankle induration -Previous Doppler negative for DVT PLAN  -I recommend she continues to use compression socks, epsom salt bath and continue to RICE -venous stasis dermatitis with possible cellulitis- She completed er antibiotics -Was not mentioned in today's visit. Likely resolved.    RTC with Dr [name] in 2 months with labs  All of the patients and her accompanying family's questions were answered with apparent satisfaction. The patient knows to call the clinic with any problems, questions or concerns.   . The total time spent in the appointment was 20 minutes and more than 50% was on counseling and direct patient cares.   This document serves as a record of services personally performed by Sullivan Lone, MD.  It was created on his behalf by Joslyn Devon, a trained medical scribe. The creation of this record is based on the scribe's personal observations and the provider's statements to them.    .I have reviewed the above documentation for accuracy and completeness, and I agree with the above.  Sullivan Lone MD [place] AAHIV[facility] Hematology/On[facility] [place]  (Office):       [phone] (Work cell):  [phone] (Fax):           [phone]

## 2017-04-19 ENCOUNTER — Telehealth: Payer: Self-pay | Admitting: Cardiovascular Disease

## 2017-04-19 LAB — IRON AND TIBC
Iron: 27 ug/dL — ABNORMAL LOW (ref 41–142)
SATURATION RATIOS: 10 % — AB (ref 21–57)
TIBC: 269 ug/dL (ref 236–444)
UIBC: 241 ug/dL

## 2017-04-19 LAB — FERRITIN: Ferritin: 81 ng/mL (ref 9–269)

## 2017-04-19 MED ORDER — LOSARTAN POTASSIUM 25 MG PO TABS: 25.0000 mg | ORAL_TABLET | Freq: Every day | ORAL | 1 refills | Status: DC

## 2017-04-19 NOTE — Telephone Encounter (Signed)
°  New message  Pt daughter verbalized that she is calling for RN  To go over her mothers medications to see what  She should be taking and what is discontinued

## 2017-04-19 NOTE — Telephone Encounter (Signed)
Dtr calling to see if Losartan was ever discontinued since she last saw Dr. Johnsie Cancel in December.  She reports that she doesn't think it has been but wanted to confirm. Informed her that our office/physician did not stop this medication. Will send in refills to requested pharmacy.

## 2017-05-18 ENCOUNTER — Other Ambulatory Visit: Payer: Medicare Other

## 2017-05-18 ENCOUNTER — Ambulatory Visit: Payer: Medicare Other | Admitting: Hematology

## 2017-05-18 ENCOUNTER — Other Ambulatory Visit: Payer: Self-pay | Admitting: *Deleted

## 2017-05-18 ENCOUNTER — Telehealth: Payer: Self-pay | Admitting: *Deleted

## 2017-05-18 MED ORDER — POLYSACCHARIDE IRON COMPLEX 150 MG PO CAPS: 150.0000 mg | ORAL_CAPSULE | Freq: Every day | ORAL | 1 refills | Status: DC

## 2017-05-18 NOTE — Telephone Encounter (Signed)
Received call from daughter, [name] asking about labs done more than 1 mo ago.  She states she hasn't receive results & someone was to call if they were low.  She reports that her mother is 84 years old & is feeling weak.  She would like to know if she needs an iron infusion.  Return # is [phone].  Message to Dr Launa Flight RN

## 2017-05-18 NOTE — Telephone Encounter (Signed)
Per Dr. [name], I reviewed lab results from 04/18/17 with daughter Pam. Dr. [name] would like to start her on Iron Polysaccharide 150 mg po daily. Pam verbalized understanding of labs and medication. I will E-scribe prescription to [facility] on [address].

## 2017-05-19 ENCOUNTER — Encounter (HOSPITAL_COMMUNITY): Payer: Self-pay | Admitting: Emergency Medicine

## 2017-05-19 ENCOUNTER — Emergency Department (HOSPITAL_COMMUNITY): Payer: Medicare Other

## 2017-05-19 ENCOUNTER — Observation Stay (HOSPITAL_COMMUNITY)
Admission: EM | Admit: 2017-05-19 | Discharge: 2017-05-20 | Disposition: A | Discharge status: Home / Self Care | Payer: Medicare Other | Attending: Internal Medicine | Admitting: Internal Medicine

## 2017-05-19 ENCOUNTER — Other Ambulatory Visit: Payer: Self-pay

## 2017-05-19 DIAGNOSIS — E039 Hypothyroidism, unspecified: Secondary | ICD-10-CM | POA: Insufficient documentation

## 2017-05-19 DIAGNOSIS — D509 Iron deficiency anemia, unspecified: Secondary | ICD-10-CM | POA: Diagnosis not present

## 2017-05-19 DIAGNOSIS — R531 Weakness: Secondary | ICD-10-CM | POA: Diagnosis present

## 2017-05-19 DIAGNOSIS — Z87891 Personal history of nicotine dependence: Secondary | ICD-10-CM | POA: Diagnosis not present

## 2017-05-19 DIAGNOSIS — Z7982 Long term (current) use of aspirin: Secondary | ICD-10-CM | POA: Diagnosis not present

## 2017-05-19 DIAGNOSIS — I5042 Chronic combined systolic (congestive) and diastolic (congestive) heart failure: Secondary | ICD-10-CM | POA: Insufficient documentation

## 2017-05-19 DIAGNOSIS — R2689 Other abnormalities of gait and mobility: Secondary | ICD-10-CM | POA: Insufficient documentation

## 2017-05-19 DIAGNOSIS — Z79899 Other long term (current) drug therapy: Secondary | ICD-10-CM | POA: Diagnosis not present

## 2017-05-19 DIAGNOSIS — D649 Anemia, unspecified: Secondary | ICD-10-CM | POA: Diagnosis present

## 2017-05-19 DIAGNOSIS — E871 Hypo-osmolality and hyponatremia: Principal | ICD-10-CM | POA: Insufficient documentation

## 2017-05-19 DIAGNOSIS — C8307 Small cell B-cell lymphoma, spleen: Secondary | ICD-10-CM | POA: Insufficient documentation

## 2017-05-19 DIAGNOSIS — D731 Hypersplenism: Secondary | ICD-10-CM | POA: Diagnosis not present

## 2017-05-19 DIAGNOSIS — I110 Hypertensive heart disease with heart failure: Secondary | ICD-10-CM | POA: Diagnosis not present

## 2017-05-19 DIAGNOSIS — I700 Atherosclerosis of aorta: Secondary | ICD-10-CM | POA: Diagnosis not present

## 2017-05-19 DIAGNOSIS — N3000 Acute cystitis without hematuria: Secondary | ICD-10-CM

## 2017-05-19 HISTORY — DX: Dyspnea, unspecified: R06.00

## 2017-05-19 HISTORY — DX: Heart failure, unspecified: I50.9

## 2017-05-19 HISTORY — DX: Hypothyroidism, unspecified: E03.9

## 2017-05-19 HISTORY — DX: Malignant (primary) neoplasm, unspecified: C80.1

## 2017-05-19 HISTORY — DX: Cardiac arrhythmia, unspecified: I49.9

## 2017-05-19 HISTORY — DX: Angina pectoris, unspecified: I20.9

## 2017-05-19 HISTORY — DX: Anemia, unspecified: D64.9

## 2017-05-19 LAB — TECHNOLOGIST SMEAR REVIEW

## 2017-05-19 LAB — CBC WITH DIFFERENTIAL/PLATELET
BASOS ABS: 0 10*3/uL (ref 0.0–0.1)
Basophils Relative: 0 %
Eosinophils Absolute: 0 10*3/uL (ref 0.0–0.7)
Eosinophils Relative: 1 %
HEMATOCRIT: 29.2 % — AB (ref 36.0–46.0)
HEMOGLOBIN: 8.8 g/dL — AB (ref 12.0–15.0)
LYMPHS PCT: 22 %
Lymphs Abs: 1 10*3/uL (ref 0.7–4.0)
MCH: 23.2 pg — ABNORMAL LOW (ref 26.0–34.0)
MCHC: 30.1 g/dL (ref 30.0–36.0)
MCV: 77 fL — AB (ref 78.0–100.0)
Monocytes Absolute: 0.5 10*3/uL (ref 0.1–1.0)
Monocytes Relative: 12 %
NEUTROS ABS: 3 10*3/uL (ref 1.7–7.7)
NEUTROS PCT: 65 %
Platelets: 189 10*3/uL (ref 150–400)
RBC: 3.79 MIL/uL — ABNORMAL LOW (ref 3.87–5.11)
RDW: 15.9 % — ABNORMAL HIGH (ref 11.5–15.5)
WBC: 4.5 10*3/uL (ref 4.0–10.5)

## 2017-05-19 LAB — URINALYSIS, ROUTINE W REFLEX MICROSCOPIC
BILIRUBIN URINE: NEGATIVE
Glucose, UA: NEGATIVE mg/dL
Ketones, ur: NEGATIVE mg/dL
LEUKOCYTES UA: NEGATIVE
Nitrite: POSITIVE — AB
PROTEIN: NEGATIVE mg/dL
pH: 6 (ref 5.0–8.0)

## 2017-05-19 LAB — BRAIN NATRIURETIC PEPTIDE: B NATRIURETIC PEPTIDE 5: 2200.8 pg/mL — AB (ref 0.0–100.0)

## 2017-05-19 LAB — ABO/RH: ABO/RH(D): O NEG

## 2017-05-19 LAB — COMPREHENSIVE METABOLIC PANEL
ALT: 15 U/L (ref 14–54)
AST: 25 U/L (ref 15–41)
Albumin: 3.3 g/dL — ABNORMAL LOW (ref 3.5–5.0)
Alkaline Phosphatase: 83 U/L (ref 38–126)
Anion gap: 12 (ref 5–15)
BUN: 18 mg/dL (ref 6–20)
CHLORIDE: 101 mmol/L (ref 101–111)
CO2: 25 mmol/L (ref 22–32)
Calcium: 8.7 mg/dL — ABNORMAL LOW (ref 8.9–10.3)
Creatinine, Ser: 0.92 mg/dL (ref 0.44–1.00)
GFR calc Af Amer: >60 mL/min (ref >60)
GFR, EST NON AFRICAN AMERICAN: 53 mL/min — AB (ref >60)
GLUCOSE: 114 mg/dL — AB (ref 65–99)
Potassium: 3.4 mmol/L — ABNORMAL LOW (ref 3.5–5.1)
Sodium: 138 mmol/L (ref 135–145)
TOTAL PROTEIN: 7.2 g/dL (ref 6.5–8.1)
Total Bilirubin: 0.9 mg/dL (ref 0.3–1.2)

## 2017-05-19 LAB — POC OCCULT BLOOD, ED: Fecal Occult Bld: NEGATIVE

## 2017-05-19 LAB — I-STAT CG4 LACTIC ACID, ED: Lactic Acid, Venous: 0.76 mmol/L (ref 0.5–1.9)

## 2017-05-19 LAB — I-STAT TROPONIN, ED: Troponin i, poc: 0.02 ng/mL (ref 0.00–0.08)

## 2017-05-19 LAB — SAVE SMEAR

## 2017-05-19 LAB — LIPASE, BLOOD: LIPASE: 25 U/L (ref 11–51)

## 2017-05-19 LAB — TYPE AND SCREEN
ABO/RH(D): O NEG
Antibody Screen: NEGATIVE

## 2017-05-19 LAB — LACTATE DEHYDROGENASE: LDH: 204 U/L — ABNORMAL HIGH (ref 98–192)

## 2017-05-19 MED ORDER — SODIUM CHLORIDE 0.9 % IV BOLUS
1000.0000 mL | Freq: Once | INTRAVENOUS | Status: AC
  Administered 2017-05-19: 1000 mL via INTRAVENOUS

## 2017-05-19 MED ORDER — IOHEXOL 300 MG/ML  SOLN
100.0000 mL | Freq: Once | INTRAMUSCULAR | Status: AC | PRN
  Administered 2017-05-19: 100 mL via INTRAVENOUS

## 2017-05-19 MED ORDER — ACETAMINOPHEN 325 MG PO TABS: 650.0000 mg | ORAL_TABLET | Freq: Four times a day (QID) | ORAL | Status: DC | PRN

## 2017-05-19 MED ORDER — ACETAMINOPHEN 650 MG RE SUPP: 650.0000 mg | Freq: Four times a day (QID) | RECTAL | Status: DC | PRN

## 2017-05-19 MED ORDER — POLYSACCHARIDE IRON COMPLEX 150 MG PO CAPS
150.0000 mg | ORAL_CAPSULE | Freq: Every day | ORAL | Status: DC
  Administered 2017-05-20: 150 mg via ORAL
  Filled 2017-05-19: qty 1

## 2017-05-19 MED ORDER — TORSEMIDE 20 MG PO TABS
10.0000 mg | ORAL_TABLET | Freq: Every day | ORAL | Status: DC
  Administered 2017-05-20: 10 mg via ORAL
  Filled 2017-05-19: qty 1

## 2017-05-19 MED ORDER — LOSARTAN POTASSIUM 25 MG PO TABS
25.0000 mg | ORAL_TABLET | Freq: Every day | ORAL | Status: DC
  Administered 2017-05-20: 25 mg via ORAL
  Filled 2017-05-19: qty 1

## 2017-05-19 MED ORDER — SODIUM CHLORIDE 0.9 % IV SOLN
1.0000 g | Freq: Once | INTRAVENOUS | Status: AC
  Administered 2017-05-19: 1 g via INTRAVENOUS
  Filled 2017-05-19: qty 10

## 2017-05-19 MED ORDER — LEVOTHYROXINE SODIUM 25 MCG PO TABS: 125.0000 ug | ORAL_TABLET | ORAL | Status: DC

## 2017-05-19 MED ORDER — ENSURE ENLIVE PO LIQD
237.0000 mL | Freq: Two times a day (BID) | ORAL | Status: DC
  Administered 2017-05-19 – 2017-05-20 (×2): 237 mL via ORAL

## 2017-05-19 MED ORDER — CARVEDILOL 6.25 MG PO TABS
6.2500 mg | ORAL_TABLET | Freq: Two times a day (BID) | ORAL | Status: DC
  Administered 2017-05-19 – 2017-05-20 (×2): 6.25 mg via ORAL
  Filled 2017-05-19 (×2): qty 1

## 2017-05-19 MED ORDER — LEVOTHYROXINE SODIUM 112 MCG PO TABS
112.0000 ug | ORAL_TABLET | ORAL | Status: DC
  Administered 2017-05-20: 112 ug via ORAL
  Filled 2017-05-19: qty 1

## 2017-05-19 MED ORDER — SENNOSIDES-DOCUSATE SODIUM 8.6-50 MG PO TABS: 1.0000 | ORAL_TABLET | Freq: Every evening | ORAL | Status: DC | PRN

## 2017-05-19 NOTE — ED Triage Notes (Signed)
Pt here from home with c/o "not feeling well". N, pt has no c/o pain

## 2017-05-19 NOTE — H&P (Addendum)
Date: 05/19/2017               Patient Name:  Kathleen Arias MRN: [id number]  DOB: January 28, 1925 Age / Sex: 84 y.o., female   PCP: [name], MD         Medical Service: Internal Medicine Teaching Service         Attending Physician: Dr. Oval Linsey, MD    First Contact: Dr. Ronalee Red Pager: [phone]  Second Contact: Dr. Philipp Ovens Pager: [phone]       After Hours (After 5p/  First Contact Pager: [phone]  weekends / holidays): Second Contact Pager: [phone]   Chief Complaint: weakness  History of Present Illness:  Kathleen Arias is a 84yo female with PMH of splenic marginal zone B-cell lymphoma, HFrEF (TTE 10/2016: EF 25-30%, grade 2 DD), HTN, hypothyroidism, and chronic anemia who presents with generalized weakness. Two daughters at bedside who assist with additional history.  She has had 3 weeks of generalized weakness, SOB, and fatigue. She experienced worsened weakness, dizziness, and a sensation of the room spinning last night, which prompted her to come to the ER this morning.  She also endorses decreased appetite and PO intake, as well as early satiety. She denies chest pain, abdominal pain, dysuria, or fevers. She occasionally endorses lower extremity swelling that is worse at the end of the day, she also takes torsemide. She denies recent cold symptoms or viral illness, denies recent travel.   She lives at home alone but has daughters who live nearby who assist her. She does not drive. She states she typically ambulates without assistance but due to the weakness for the last 3 weeks, she has been using her walker. She administers her own medications, reports compliance.  She follows with Dr. [name] at the [facility] for her splenic marginal zone lymphoma. At her last visit on 4/2, her spleen was noted to be barely palpable and fatigue was felt to be due to a recent cold/virus. They discussed maintenance rituximab infusions, but it was decided to hold off at that time due to her  comorbidities, age, and her preference.  ED Course: - BP 159/74, HR 80, RR 16, temp 97.5, O2 95% on RA - CBC with Hb 8.8, MCV 77. CMP with K 3.4, normal Cr. Lipase 25, lactic acid 0.76, troponin negative. BNP elevated at 2200. UA with small Hgb, positive nitrite, neg WBC, and many bacteria - CT A/P shows stable large gallbladder calcification, concerning for large gallstone vs porcelain gallbladder, stable splenomegaly with a 5.9cm low density lesion, and sigmoid diverticulosis. CXR shows cardiomegaly with no acute disease. EKG with 1st degree AV block, wide complex QRS, PVC. - Received 1L IV NS bolus and IV ceftriaxone  Meds:  Current Meds  Medication Sig  . aspirin EC 325 MG tablet Take 325 mg by mouth daily.   . B COMPLEX-C-CALCIUM PO Take 1 tablet by mouth daily.  . carvedilol (COREG) 6.25 MG tablet Take 6.25 mg by mouth 2 (two) times daily with a meal.   . iron polysaccharides (NIFEREX) 150 MG capsule Take 1 capsule (150 mg total) by mouth daily.  Marland Kitchen levothyroxine (SYNTHROID, LEVOTHROID) 112 MCG tablet Take 112 mcg by mouth See admin instructions. Sun/Tues/Thurs/Sat  . losartan (COZAAR) 25 MG tablet Take 1 tablet (25 mg total) by mouth daily.  . naproxen sodium (ALEVE) 220 MG tablet Take 220 mg by mouth daily as needed (pain).  Marland Kitchen OVER THE COUNTER MEDICATION Take 2 tablets by mouth daily.  VITAFUSION gummies  . potassium chloride SA (K-DUR,KLOR-CON) 20 MEQ tablet Take 1 tablet (20 mEq total) by mouth daily.  Marland Kitchen torsemide (DEMADEX) 20 MG tablet Take 1 tablet (20 mg total) by mouth daily. (Patient taking differently: Take 20 mg by mouth every other day. )  . Vitamin E 400 units TABS Take 800 Units by mouth daily.    Allergies: Allergies as of 05/19/2017  . (No Known Allergies)   Past Medical History:  Diagnosis Date  . Arthritis   . Hypertension   . Thyroid disease    Family History:  Family History  Problem Relation Age of Onset  . Heart disease Mother    Social History:  -  Denies smoking or alcohol use - Lives at home alone  Review of Systems: A complete ROS was negative except as per HPI.  Physical Exam: Blood pressure (!) 160/63, pulse 63, temperature (!) 97.5 F (36.4 C), temperature source Oral, resp. rate 16, SpO2 96 %. GEN: Elderly thin female lying in bed. Alert and oriented. No acute distress. HENT: [place]/AT. Moist mucous membranes. No visible lesions. EYES: PERRL. Sclera non-icteric. Conjunctival pallor. RESP: Clear to auscultation bilaterally. No wheezes, rales, or rhonchi. No increased work of breathing. CV: Normal rate and regular rhythm. No murmurs, gallops, or rubs. No LE edema. ABD: Diffusely TTP - particularly in LUQ, palpable splenomegaly. +BS. EXT: Trace BLE edema, BLE TTP. NEURO: Cranial nerves II-XII grossly intact. Able to lift all four extremities against gravity. No apparent audiovisual hallucinations. Speech fluent and appropriate.  Labs CBC Latest Ref Rng & Units 05/19/2017 04/18/2017 01/31/2017  WBC 4.0 - 10.5 K/uL 4.5 5.9 5.4  Hemoglobin 12.0 - 15.0 g/dL 8.8(L) 10.2(L) 10.9(L)  Hematocrit 36.0 - 46.0 % 29.2(L) 32.2(L) 32.6(L)  Platelets 150 - 400 K/uL 189 225 264   CMP Latest Ref Rng & Units 05/19/2017 04/18/2017 01/31/2017  Glucose 65 - 99 mg/dL 114(H) 91 88  BUN 6 - 20 mg/dL 18 19 19   Creatinine 0.44 - 1.00 mg/dL 0.92 0.97 0.87  Sodium 135 - 145 mmol/L 138 136 136  Potassium 3.5 - 5.1 mmol/L 3.4(L) 4.0 3.8  Chloride 101 - 111 mmol/L 101 99 102  CO2 22 - 32 mmol/L 25 29 26   Calcium 8.9 - 10.3 mg/dL 8.7(L) 9.5 9.0  Total Protein 6.5 - 8.1 g/dL 7.2 7.8 7.3  Total Bilirubin 0.3 - 1.2 mg/dL 0.9 0.7 0.7  Alkaline Phos 38 - 126 U/L 83 96 94  AST 15 - 41 U/L 25 19 23   ALT 14 - 54 U/L 15 12 20    Lipase 25 lactic acid 0.76 troponin negative BNP 2200 UA with small Hgb, positive nitrite, neg WBC, and many bacteria  EKG: personally reviewed my interpretation is 1st degree AV block, occasional PVC, widened QRS, prolonged QTc at 530, no  ST elevation or T wave inversions  CXR: personally reviewed my interpretation is increased heart size (although CXR is AP), silhouetting of right hemidiaphragm suggesting possible small right pleural effusion, no consolidation  CT A/P:  - Large gallbladder calcification is noted concerning for large gallstone or porcelain gallbladder. This is stable compared to prior exam. - Stable splenomegaly is noted with 5.9 cm complex low density medially which is stable compared to prior exam and most consistent with necrosis. - Sigmoid diverticulosis is noted without inflammation. - Aortic Atherosclerosis (ICD10-I70.0).  Assessment & Plan by Problem: Active Problems:   Generalized weakness  Ms. Caver is a 84yo female with PMH of splenic marginal zone B-cell  lymphoma, HFrEF (TTE 10/2016: EF 25-30%, grade 2 DD), HTN, hypothyroidism, and chronic anemia who presents with 3 weeks of generalized weakness and early satiety, found to have palpable splenomegaly and anemia. EDP initially concerned for anemia vs cystitis, and thus ceftriaxone was started. However, her UA is more consistent with asymptomatic bacteriuria, thus we will discontinue antibiotics. To combine her whole presentation into one diagnosis, this could represent relapse of her splenic marginal zone lymphoma.  Generalized weakness, early satiety Patient describes generalized symptoms for which EDP was initially concerned for anemia vs cystitis. However, she denies any urinary symptoms, suggesting that she has asymptomatic bacteriuria. Will discontinue ceftriaxone. Her weakness could be explained by anemia, although she does have anemia at baseline. She denies any cold symptoms to suggest viral infection as the etiology of her weakness. She also has had decreased PO intake, which could be contributing to her feeling of generalized weakness. She does have palpable splenomegaly, and it is possible that her presentation could represent relapse of splenic  marginal zone lymphoma. - Continue to monitor - Will contact her Oncologist Dr. [name] - BMP in AM - PT eval  Splenic marginal zone B cell lymphoma, s/p rituximab weekly x4 doses (6/11-07/22/2016) Initially presented with anemia, lymphocytosis, thrombocytopenia, constitutional symptoms, abdominal discomfort, and anorexia. BM biopsy in 05/2016 showed B-cell lymphoproliferative process. She was previously treated with 4 doses of rituximab. She follows with Oncology (Dr. [name]). At her last visit on 04/18/2017, they discussed the role of maintenance rituximab. Given patient's comorbidities, age, and preference, provider and patient elected to hold off on maintenance rituximab at that time.  She had a PET scan in 05/2016 that showed marked splenomegaly with associated hypermetabolic activity without evidence of other hypermetabolic activity elsewhere. - LDH - Will contact Oncology (Dr. [name])  Anemia Hb 8.8 on admission (baseline ~10.2-11.4). She does have a history of splenic marginal zone lymphoma, which can cause cytopenias related to splenomegaly or immune mediated (AIHA, immune thrombocytopenia). Platelets normal. - CBC in AM - Peripheral blood smear - FOBT - Continue home iron 150mg  daily - Holding home aspirin - SCDs until further evaluation of anemia  HFrEF (TTE 10/2016: EF 25-30%, grade 2 DD) Home regimen includes losartan 25mg  daily, carvedilol 6.25mg  BID, and torsemide 10mg  daily. She did receive 1L of IVF in the ED. BNP elevated at 2200. She appears euvolemic on exam currently and denies difficulty breathing. Will monitor closely for volume overload. - Continue home carvedilol 6.25mg  BID, losartan 25mg  daily, and torsemide 10mg  daily - BMP in AM - Daily weights - I/Os  HTN Home regimen includes losartan 25mg  daily and carvedilol 6.25mg  BID. BP 142/67. - Continue home carvedilol 6.25mg  BID and losartan 25mg  daily  Hypothyroidism - Continue home levothyroxine 165mcg qMWF, 123mcg on  other days  Asymptomatic bacteriuria UA suggestive of infection, however she denies symptoms. She was started on ceftriaxone by EDP, will discontinue. No fever, no leukocytosis. - Discontinue ceftriaxone  Diet: Regular VTE PPx: SCDs Code Status: DNR/DNI (confirmed on admission) Dispo: Admit patient to Observation with expected length of stay less than 2 midnights.  Signed: Colbert Ewing, MD 05/19/2017, 2:50 PM  Pager: Mamie Nick [phone]

## 2017-05-19 NOTE — Plan of Care (Signed)
  Problem: Education: Goal: Knowledge of General Education information will improve Outcome: Progressing   

## 2017-05-19 NOTE — ED Notes (Signed)
Dr. Maryan Rued reporting pt can eat, meal tray ordered, given coke.

## 2017-05-19 NOTE — ED Notes (Signed)
Patient transported to CT 

## 2017-05-19 NOTE — Progress Notes (Signed)
IM resident paged for orders for new admission.

## 2017-05-19 NOTE — ED Provider Notes (Signed)
[facility] EMERGENCY DEPARTMENT Provider Note   CSN: [id number] Arrival date & time: 05/19/17  0258     History   Chief Complaint Chief Complaint  Patient presents with  . Weakness    HPI Kathleen Arias is a 84 y.o. female.  Patient is a 84 year old female with a history of hypertension, thyroid disease, B-cell lymphoma, hyponatremia who is presenting today with generalized weakness.  Patient states for the last 1 week she has had some stomach upset with decreased p.o. intake.  She denies any vomiting or diarrhea.  She has not had any leg pain, swelling, cough.  She states that last night she had an episode where she had some dizziness with spinning and general weakness.  This lasted for a few minutes but she denies any dizziness currently.  She has had no chest pain but does feel slightly short of breath constantly.  She denies any urinary symptoms.  The history is provided by the patient.    Past Medical History:  Diagnosis Date  . Arthritis   . Hypertension   . Thyroid disease     Patient Active Problem List   Diagnosis Date Noted  . Edema of left lower extremity 02/21/2017  . 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 Counseling regarding advanced care planning and goals of care 05/21/2016  . Iron deficiency anemia 05/16/2016  . Anemia, iron deficiency 01/07/2016  . Normocytic anemia 01/07/2016  . Abnormal stress test   . Chest pain 01/05/2016  . Shortness of breath   . Localized swelling of lower extremity   . Hyponatremia   . Hyperglycemia   . Musculoskeletal pain     Past Surgical History:  Procedure Laterality Date  . CARDIAC CATHETERIZATION N/A 01/07/2016   Procedure: Left Heart Cath and Coronary Angiography;  Surgeon: Jettie Booze, MD;  Location: [facility];  Service: Cardiovascular;  Laterality: N/A;  . CATARACT EXTRACTION, BILATERAL    . HIP SURGERY       OB History   None      Home Medications     Prior to Admission medications   Medication Sig Start Date End Date Taking? Authorizing Provider  aspirin EC 325 MG tablet Take 325 mg by mouth 2 (two) times daily.     [provider]  B COMPLEX-C-CALCIUM PO Take 1 tablet by mouth daily.    [provider]  carvedilol (COREG) 6.25 MG tablet Take 6.25 mg by mouth 2 (two) times daily with a meal.     [provider]  iron polysaccharides (NIFEREX) 150 MG capsule Take 1 capsule (150 mg total) by mouth daily. 05/18/17   Brunetta Genera, MD  levothyroxine (SYNTHROID, LEVOTHROID) 112 MCG tablet Take 112 mcg by mouth See admin instructions. Sun/Tues/Thurs/Sat    [provider]  levothyroxine Wilmer Floor, LEVOTHROID) 125 MCG tablet Take 125 mcg by mouth every Monday, Wednesday, and Friday.  08/21/15   [provider]  losartan (COZAAR) 25 MG tablet Take 1 tablet (25 mg total) by mouth daily. 04/19/17   [name], MD  naproxen sodium (ALEVE) 220 MG tablet Take 220 mg by mouth daily as needed (pain).    [provider]  OVER THE COUNTER MEDICATION Take 2 tablets by mouth daily. VITAFUSION gummies    [provider]  potassium chloride SA (K-DUR,KLOR-CON) 20 MEQ tablet Take 1 tablet (20 mEq total) by mouth daily. 02/02/17   Isaiah Serge, NP  torsemide [facility]) 20  MG tablet Take 1 tablet (20 mg total) by mouth daily. 02/02/17 05/03/17  Isaiah Serge, NP  Vitamin E 400 units TABS Take 800 Units by mouth daily.     [provider]    Family History Family History  Problem Relation Age of Onset  . Heart disease Mother     Social History Social History   Tobacco Use  . Smoking status: Former Research scientist (life sciences)  . Smokeless tobacco: Never Used  Substance Use Topics  . Alcohol use: No  . Drug use: No     Allergies   Patient has no known allergies.   Review of Systems Review of Systems  All other systems reviewed and are negative.    Physical Exam Updated Vital  Signs BP (!) 159/74 (BP Location: Right Arm)   Pulse 80   Temp (!) 97.5 F (36.4 C) (Oral)   SpO2 95%   Physical Exam  Constitutional: She is oriented to person, place, and time. She appears well-developed and well-nourished. No distress.  HENT:  Head: Normocephalic and atraumatic.  Mouth/Throat: Oropharynx is clear and moist.  Dry mucous membranes.  Pale conjunctive a  Eyes: Pupils are equal, round, and reactive to light. EOM are normal.  Neck: Normal range of motion. Neck supple.  Cardiovascular: Normal rate, regular rhythm and intact distal pulses.  No murmur heard. Pulmonary/Chest: Effort normal and breath sounds normal. No respiratory distress. She has no wheezes. She has no rales.  Abdominal: Soft. Bowel sounds are normal. She exhibits no distension. There is tenderness. There is guarding. There is no rebound.  Diffuse abdominal tenderness worse in the upper quadrants with some guarding  Musculoskeletal: Normal range of motion. She exhibits no edema or tenderness.  Neurological: She is alert and oriented to person, place, and time.  Skin: Skin is warm and dry. No rash noted. No erythema. There is pallor.  Psychiatric: She has a normal mood and affect. Her behavior is normal.  Nursing note and vitals reviewed.    ED Treatments / Results  Labs (all labs ordered are listed, but only abnormal results are displayed) Labs Reviewed  CBC WITH DIFFERENTIAL/PLATELET - Abnormal; Notable for the following components:      Result Value   RBC 3.79 (*)    Hemoglobin 8.8 (*)    HCT 29.2 (*)    MCV 77.0 (*)    MCH 23.2 (*)    RDW 15.9 (*)    All other components within normal limits  COMPREHENSIVE METABOLIC PANEL - Abnormal; Notable for the following components:   Potassium 3.4 (*)    Glucose, Bld 114 (*)    Calcium 8.7 (*)    Albumin 3.3 (*)    GFR calc non Af Amer 53 (*)    All other components within normal limits  URINALYSIS, ROUTINE W REFLEX MICROSCOPIC - Abnormal; Notable  for the following components:   APPearance HAZY (*)    Specific Gravity, Urine >1.046 (*)    Hgb urine dipstick SMALL (*)    Nitrite POSITIVE (*)    Bacteria, UA MANY (*)    All other components within normal limits  BRAIN NATRIURETIC PEPTIDE - Abnormal; Notable for the following components:   B Natriuretic Peptide 2,200.8 (*)    All other components within normal limits  URINE CULTURE  LIPASE, BLOOD  OCCULT BLOOD X 1 CARD TO LAB, STOOL  I-STAT CG4 LACTIC ACID, ED  POC OCCULT BLOOD, ED  I-STAT TROPONIN, ED  TYPE AND SCREEN  EKG EKG Interpretation  Date/Time:  Friday May 19 2017 09:07:25 EDT Ventricular Rate:  80 PR Interval:  214 QRS Duration: 150 QT Interval:  460 QTC Calculation: 530 R Axis:   -82 Text Interpretation:  Sinus rhythm with 1st degree A-V block with occasional and consecutive Premature ventricular complexes Left axis deviation Non-specific intra-ventricular conduction block No significant change since last tracing Confirmed by Blanchie Dessert [phone]) on 05/19/2017 10:00:02 AM   Radiology Ct Abdomen Pelvis W Contrast  Result Date: 05/19/2017 CLINICAL DATA:  Generalized abdominal pain. EXAM: CT ABDOMEN AND PELVIS WITH CONTRAST TECHNIQUE: Multidetector CT imaging of the abdomen and pelvis was performed using the standard protocol following bolus administration of intravenous contrast. CONTRAST:  121mL OMNIPAQUE IOHEXOL 300 MG/ML  SOLN COMPARISON:  PET scan of Jun 06, 2016. FINDINGS: Lower chest: No acute abnormality. Hepatobiliary: Large gallbladder calcification is noted concerning for a large gallstone or porcelain gallbladder. The liver is unremarkable. No biliary dilatation is noted. Pancreas: Unremarkable. No pancreatic ductal dilatation or surrounding inflammatory changes. Spleen: Stable splenomegaly is noted with stable low density seen medially in the spleen consistent with necrosis as described on prior PET scan. Adrenals/Urinary Tract: Adrenal glands are  unremarkable. Kidneys are normal, without renal calculi, focal lesion, or hydronephrosis. Bladder is unremarkable. Stomach/Bowel: The stomach appears normal. There is no evidence of bowel obstruction or inflammation. Sigmoid diverticulosis is noted without inflammation. The appendix is not visualized. Vascular/Lymphatic: Aortic atherosclerosis. No enlarged abdominal or pelvic lymph nodes. Reproductive: Uterus is not clearly identified. No adnexal abnormality is noted. Other: Stable small fluid-filled right inguinal hernia is noted. No abdominopelvic ascites. Musculoskeletal: Status post left hip arthroplasty. No acute osseous abnormality is noted. IMPRESSION: Large gallbladder calcification is noted concerning for large gallstone or porcelain gallbladder. This is stable compared to prior exam. Stable splenomegaly is noted with 5.9 cm complex low density medially which is stable compared to prior exam and most consistent with necrosis. Sigmoid diverticulosis is noted without inflammation. Aortic Atherosclerosis (ICD10-I70.0). Electronically Signed   By: Marijo Conception, M.D.   On: 05/19/2017 12:11    Procedures Procedures (including critical care time)  Medications Ordered in ED Medications  sodium chloride 0.9 % bolus 1,000 mL (has no administration in time range)     Initial Impression / Assessment and Plan / ED Course  I have reviewed the triage vital signs and the nursing notes.  Pertinent labs & imaging results that were available during my care of the patient were reviewed by me and considered in my medical decision making (see chart for details).     Elderly female presenting today with generalized weakness and decreased p.o. intake for the last 1 week due to stomach upset.  On exam patient's vital signs are reassuring.  She does have diffuse abdominal tenderness but no other acute findings.  Patient appears to be dehydrated but also concerned about abdominal pathology, hepatitis,  pancreatitis, bowel obstruction or colitis.  Low suspicion for cardiac etiology.  Patient denies vomiting or diarrhea and is not currently nauseated. CBC, CMP, lipase, UA and lactate pending.  Patient given IV fluids.  EKG without acute findings.  12:40 PM Patient's labs are significant for anemia with a hemoglobin of 8.8 from 10.2 less than 2-month ago.  Unchanged CMP, Hemoccult negative and normal lactic acid.  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  And patient's total bili, AST, ALT and lipase are without acute findings.  Patient's daughter arrived and states that she has been dealing with congestive heart failure and most recently had an echo done in October with worsening EF of 25 to 30%.  Daughter thinks that may be why she is been weak but she is also not been eating.  Troponin, BNP and chest x-ray also pending.  Given patient's generalized weakness, shortness of breath, worsening anemia may need admission for further evaluation.  1:57 PM Patient's chest x-ray and troponin without acute findings.  UA with signs concerning for infection with positive nitrites, 11-20 white blood cells and many bacteria which may also be a cause of her feeling weak.  Will give Rocephin, culture urine and admit.  Final Clinical Impressions(s) / ED Diagnoses   Final diagnoses:  Weakness  Anemia, unspecified type  Acute cystitis without hematuria    ED Discharge Orders    None       Blanchie Dessert, MD 05/19/17 1433

## 2017-05-19 NOTE — ED Notes (Signed)
Patient transported to X-ray 

## 2017-05-19 NOTE — Progress Notes (Signed)
Dr Virgina Jock paged for orders for new admisstion to [phone]

## 2017-05-20 DIAGNOSIS — Z79890 Hormone replacement therapy (postmenopausal): Secondary | ICD-10-CM

## 2017-05-20 DIAGNOSIS — R6881 Early satiety: Secondary | ICD-10-CM | POA: Diagnosis not present

## 2017-05-20 DIAGNOSIS — R531 Weakness: Secondary | ICD-10-CM

## 2017-05-20 DIAGNOSIS — R8271 Bacteriuria: Secondary | ICD-10-CM

## 2017-05-20 DIAGNOSIS — Z79899 Other long term (current) drug therapy: Secondary | ICD-10-CM

## 2017-05-20 DIAGNOSIS — D649 Anemia, unspecified: Secondary | ICD-10-CM

## 2017-05-20 DIAGNOSIS — C8307 Small cell B-cell lymphoma, spleen: Secondary | ICD-10-CM

## 2017-05-20 DIAGNOSIS — I110 Hypertensive heart disease with heart failure: Secondary | ICD-10-CM

## 2017-05-20 DIAGNOSIS — E039 Hypothyroidism, unspecified: Secondary | ICD-10-CM

## 2017-05-20 DIAGNOSIS — I5042 Chronic combined systolic (congestive) and diastolic (congestive) heart failure: Secondary | ICD-10-CM

## 2017-05-20 DIAGNOSIS — R42 Dizziness and giddiness: Secondary | ICD-10-CM

## 2017-05-20 DIAGNOSIS — R161 Splenomegaly, not elsewhere classified: Secondary | ICD-10-CM

## 2017-05-20 DIAGNOSIS — Z7982 Long term (current) use of aspirin: Secondary | ICD-10-CM

## 2017-05-20 LAB — CBC
HEMATOCRIT: 27.5 % — AB (ref 36.0–46.0)
Hemoglobin: 8.4 g/dL — ABNORMAL LOW (ref 12.0–15.0)
MCH: 23.7 pg — ABNORMAL LOW (ref 26.0–34.0)
MCHC: 30.5 g/dL (ref 30.0–36.0)
MCV: 77.5 fL — AB (ref 78.0–100.0)
Platelets: 166 10*3/uL (ref 150–400)
RBC: 3.55 MIL/uL — ABNORMAL LOW (ref 3.87–5.11)
RDW: 16.1 % — AB (ref 11.5–15.5)
WBC: 3.4 10*3/uL — AB (ref 4.0–10.5)

## 2017-05-20 LAB — BASIC METABOLIC PANEL
Anion gap: 9 (ref 5–15)
BUN: 15 mg/dL (ref 6–20)
CHLORIDE: 100 mmol/L — AB (ref 101–111)
CO2: 27 mmol/L (ref 22–32)
Calcium: 8.6 mg/dL — ABNORMAL LOW (ref 8.9–10.3)
Creatinine, Ser: 0.82 mg/dL (ref 0.44–1.00)
GFR calc Af Amer: >60 mL/min (ref >60)
GFR calc non Af Amer: >60 mL/min (ref >60)
GLUCOSE: 96 mg/dL (ref 65–99)
POTASSIUM: 3.5 mmol/L (ref 3.5–5.1)
Sodium: 136 mmol/L (ref 135–145)

## 2017-05-20 LAB — TSH: TSH: 20.927 u[IU]/mL — ABNORMAL HIGH (ref 0.350–4.500)

## 2017-05-20 LAB — HEPATITIS C ANTIBODY: HCV Ab: <0.1 s/co ratio (ref 0.0–0.9)

## 2017-05-20 MED ORDER — LEVOTHYROXINE SODIUM 125 MCG PO TABS: 125.0000 ug | ORAL_TABLET | Freq: Every day | ORAL | 0 refills | Status: DC

## 2017-05-20 MED ORDER — TORSEMIDE 20 MG PO TABS: 10.0000 mg | ORAL_TABLET | Freq: Every day | ORAL | 3 refills | Status: DC

## 2017-05-20 NOTE — Progress Notes (Signed)
Subjective:  Kathleen Arias was lying in bed comfortably this morning. Daughter at bedside. She has no complaints. She states she was able to eat a bigger breakfast than usual this morning. Denies SOB or CP. She is not sure whether she will feel dizzy or lightheaded because she hasn't stood up yet. Discussed with patient and daughter that she has asymptomatic bacteriuria and does not require further antibiotics. Also discussed that our thought is that this is related to her splenomegaly from her lymphoma and that we recommend calling her oncologist for an earlier appointment upon discharge. Plan to work with PT and possible discharge later this afternoon. Patient agreeable to plan.  Objective:  Vital signs in last 24 hours: Vitals:   05/19/17 1510 05/19/17 1625 05/19/17 2009 05/20/17 0434  BP: (!) 149/74 (!) 142/67 137/74 (!) 131/97  Pulse: 76 69 68 63  Resp: 16 16    Temp: 97.6 F (36.4 C) (!) 97.5 F (36.4 C) (!) 97.5 F (36.4 C) 98.5 F (36.9 C)  TempSrc: Oral Oral Oral   SpO2: 94% 93% 98% 94%  Weight:  123 lb 12.8 oz (56.2 kg)    Height:  5\' 3"  (1.6 m)     GEN: Lying in bed in NAD, thin elderly woman CV: NR & RR, no m/r/g PULM: CTAB, no wheezes or rales MSK: No LE edema  Assessment/Plan:  Active Problems:   Generalized weakness   Kathleen Arias is a 84yo female with PMH of splenic marginal zone B-cell lymphoma, HFrEF (TTE 10/2016: EF 25-30%, grade 2 DD), HTN, hypothyroidism, and chronic anemia who presents with 3 weeks of generalized weakness and early satiety, found to have palpable splenomegaly and anemia. To combine her whole presentation into one diagnosis, our leading though is that this represents splenomegaly from her lymphoma causing her to have reduced PO intake and early satiety, which led to generalized weakness. This may have led to dehydration, for which she received 1L NS in the ED.  Generalized weakness, early satiety 2/2 Splenomegaly in the setting of splenic  marginal zone B cell lymphoma, s/p rituximab weekly x4 doses (6/11-07/22/2016) Putting together her whole picture, her presentation is concerning for symptoms related to hypersplenism. LDH only slightly elevated. She did get 1L of fluids which may have relieved possible dehydration. She denies complaints today. Will have therapy work with her - she is amenable to [facility] PT if needed. Encouraged her to call Dr. [name] to schedule a sooner follow up appointment for possible treatment options. 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 Platelets normal. LDH was only slightly elevated and peripheral blood smear unremarkable. Less likely immune mediated (AIHA, immune thrombocytopenia).  - CBC in AM - FOBT - Continue home iron 150mg  daily - Holding home aspirin - SCDs until further evaluation of anemia  HFrEF (TTE 10/2016: EF 25-30%, grade 2 DD) Home regimen includes losartan 25mg  daily, carvedilol 6.25mg  BID, and torsemide 10mg  daily. s/p 1L of IVF in the ED. BNP elevated at 2200. She appears euvolemic on exam currently and denies any difficulty breathing whatsoever. May have presented with dehydration. Cr stable. - Continue home carvedilol 6.25mg  BID, losartan 25mg  daily, and torsemide 10mg  daily - Daily weights - I/Os  HTN Home regimen includes losartan 25mg  daily and carvedilol 6.25mg  BID. BP 131/97 - Continue home carvedilol 6.25mg  BID and losartan 25mg  daily  Hypothyroidism - Continue home  levothyroxine 133mcg qMWF, 151mcg on other days  Asymptomatic bacteriuria Ceftriaxone discontinued  Dispo: Anticipated discharge in approximately 0-1 day(s).   Colbert Ewing, MD 05/20/2017, 6:43 AM Pager: Mamie Nick [phone]

## 2017-05-20 NOTE — Progress Notes (Signed)
Initial Nutrition Assessment  DOCUMENTATION CODES:   Not applicable  INTERVENTION:   -Continue Ensure Enlive po BID, each supplement provides 350 kcal and 20 grams of protein  NUTRITION DIAGNOSIS:   Increased nutrient needs related to cancer and cancer related treatments as evidenced by estimated needs.  GOAL:   Patient will meet greater than or equal to 90% of their needs  MONITOR:   PO intake, Supplement acceptance, Labs, Weight trends, Skin, I & O's  REASON FOR ASSESSMENT:   Malnutrition Screening Tool    ASSESSMENT:   Ms. Fenn is a 84yo female with PMH of splenic marginal zone B-cell lymphoma, HFrEF (TTE 10/2016: EF 25-30%, grade 2 DD), HTN, hypothyroidism, and chronic anemia who presents with generalized weakness  Pt admitted with generalized weakness.   Pt working with physical therapy at time of visit. Unable to obtain further nutrition hx or perform nutrition-focused physical exam at this time.   Reviewed wt hx; noted pt with distant hx of wt loss, however, wt has been relatively stable over the past year. Of note, pt has splenic marginal zone lymphoma; pt is followed by Dr. [name] at [facility] and plan is to hold off on treatments related to co-morbidities, age, and pt preference. Per H&P, pt has history of poor oral intake and early satiety PTA. Given increased nutrient needs for lymphoma and hx of poor oral intake, pt would benefit from oral nutrition supplements.   Case discussed with RN, who reports pt has a good appetite and ate most of her breakfast this AM. Plan is to discharge home later today, pending PT evaluation.   Labs reviewed.   Diet Order:   Diet Order           Diet regular Room service appropriate? Yes; Fluid consistency: Thin  Diet effective now          EDUCATION NEEDS:   No education needs have been identified at this time  Skin:  Skin Assessment: Reviewed RN Assessment  Last BM:  PTA  Height:   Ht Readings from Last 1 Encounters:   05/19/17 5\' 3"  (1.6 m)    Weight:   Wt Readings from Last 1 Encounters:  05/19/17 123 lb 12.8 oz (56.2 kg)    Ideal Body Weight:  52.3 kg  BMI:  Body mass index is 21.93 kg/m.  Estimated Nutritional Needs:   Kcal:  1400-1600  Protein:  65-80 grams  Fluid:  1.4-1.6 L    Darcel Frane A. Jimmye Norman, RD, LDN, CDE Pager: [phone] After hours Pager: [phone]

## 2017-05-20 NOTE — Progress Notes (Signed)
Internal Medicine Attending Admission Note Date: 05/20/2017  Patient name: Kathleen Arias Medical record number: [id number] Date of birth: 07-Dec-1925 Age: 84 y.o. Gender: female  I saw and evaluated the patient. I reviewed the resident's note and I agree with the resident's findings and plan as documented in the resident's note.  Chief Complaint: Generalized weakness 3 weeks and vertigo 1 day  History - key components related to admission:  Kathleen Arias is a 84 year old woman with a past medical history of splenic marginal zone B-cell lymphoma complicated by chronic anemia, chronic combined systolic and diastolic heart failure, hypertension, and hypothyroidism ho presents with a three-week history of generalized weakness and a one-day history of vertigo. Because of the symptoms she went to the emergency department. There, she admitted to a decreased appetite with poor oral intake and early satiety. She denied any fevers, shakes, chills, chest pain, abdominal pain, dysuria, nausea, vomiting, or diarrhea. Her hemoglobin was 8.8 down from just over 10 one month ago. She was also found to have bacteriuria. She was therefore admitted to the internal medicine teaching service for further evaluation and care.  When seen on rounds the morning after admission she felt much improved. Her appetite was excellent this morning and she ate a larger than usual breakfast. She was unable to comment on vertigo or dizziness or weakness since she has not gotten out of bed yet, but was interested in going home. Her daughter brought up the idea of a skilled nursing facility and the patient was adamantly against it.  Physical Exam - key components related to admission:  Vitals:   05/19/17 1510 05/19/17 1625 05/19/17 2009 05/20/17 0434  BP: (!) 149/74 (!) 142/67 137/74 (!) 131/97  Pulse: 76 69 68 63  Resp: 16 16    Temp: 97.6 F (36.4 C) (!) 97.5 F (36.4 C) (!) 97.5 F (36.4 C) 98.5 F (36.9 C)  TempSrc: Oral Oral  Oral   SpO2: 94% 93% 98% 94%  Weight:  123 lb 12.8 oz (56.2 kg)    Height:  5\' 3"  (1.6 m)     Gen.: Well-developed, well-nourished, woman lying comfortably in bed in no acute distress. She was pleasant, engaged, and appeared happy.  Lab results:  Basic Metabolic Panel: Recent Labs    05/19/17 0956 05/20/17 0612  NA 138 136  K 3.4* 3.5  CL 101 100*  CO2 25 27  GLUCOSE 114* 96  BUN 18 15  CREATININE 0.92 0.82  CALCIUM 8.7* 8.6*   Liver Function Tests: Recent Labs    05/19/17 0956  AST 25  ALT 15  ALKPHOS 83  BILITOT 0.9  PROT 7.2  ALBUMIN 3.3*   Recent Labs    05/19/17 0956  LIPASE 25   CBC: Recent Labs    05/19/17 0956 05/20/17 0612  WBC 4.5 3.4*  NEUTROABS 3.0  --   HGB 8.8* 8.4*  HCT 29.2* 27.5*  MCV 77.0* 77.5*  PLT 189 166   Urinalysis:  Hazy, yellow, specific gravity greater than 1.046, pH 6.0, IMA globin small, protein negative, nitrite positive, leukocytes negative, red blood cells 6-10 per high-power field, white blood cells 11-20 per high-power field, many bacteria.  Misc. Labs:  LDH 204 Lactic acid 0.76 Fecal occult blood negative BNP 2200 Hepatitis C antibody undetectable  Imaging results:  Dg Chest 2 View  Result Date: 05/19/2017 CLINICAL DATA:  Shortness of breath. EXAM: CHEST - 2 VIEW COMPARISON:  Chest x-ray dated January 05, 2016. FINDINGS: Cardiomegaly. Normal pulmonary vascularity. No  focal consolidation, pleural effusion, or pneumothorax. No acute osseous abnormality. Unchanged moderate compression fracture of T11. IMPRESSION: Cardiomegaly.  No active cardiopulmonary disease. Electronically Signed   By: Titus Dubin M.D.   On: 05/19/2017 13:05   Ct Abdomen Pelvis W Contrast  Result Date: 05/19/2017 CLINICAL DATA:  Generalized abdominal pain. EXAM: CT ABDOMEN AND PELVIS WITH CONTRAST TECHNIQUE: Multidetector CT imaging of the abdomen and pelvis was performed using the standard protocol following bolus administration of  intravenous contrast. CONTRAST:  18mL OMNIPAQUE IOHEXOL 300 MG/ML  SOLN COMPARISON:  PET scan of Jun 06, 2016. FINDINGS: Lower chest: No acute abnormality. Hepatobiliary: Large gallbladder calcification is noted concerning for a large gallstone or porcelain gallbladder. The liver is unremarkable. No biliary dilatation is noted. Pancreas: Unremarkable. No pancreatic ductal dilatation or surrounding inflammatory changes. Spleen: Stable splenomegaly is noted with stable low density seen medially in the spleen consistent with necrosis as described on prior PET scan. Adrenals/Urinary Tract: Adrenal glands are unremarkable. Kidneys are normal, without renal calculi, focal lesion, or hydronephrosis. Bladder is unremarkable. Stomach/Bowel: The stomach appears normal. There is no evidence of bowel obstruction or inflammation. Sigmoid diverticulosis is noted without inflammation. The appendix is not visualized. Vascular/Lymphatic: Aortic atherosclerosis. No enlarged abdominal or pelvic lymph nodes. Reproductive: Uterus is not clearly identified. No adnexal abnormality is noted. Other: Stable small fluid-filled right inguinal hernia is noted. No abdominopelvic ascites. Musculoskeletal: Status post left hip arthroplasty. No acute osseous abnormality is noted. IMPRESSION: Large gallbladder calcification is noted concerning for large gallstone or porcelain gallbladder. This is stable compared to prior exam. Stable splenomegaly is noted with 5.9 cm complex low density medially which is stable compared to prior exam and most consistent with necrosis. Sigmoid diverticulosis is noted without inflammation. Aortic Atherosclerosis (ICD10-I70.0). Electronically Signed   By: Marijo Conception, M.D.   On: 05/19/2017 12:11   Chest x-ray: Personally reviewed. Possible old left first rib fracture. No effusions, infiltrates, or masses.  Other results:  QJJ:HERDEY sinus rhythm at 80 bpm, 1 PVC, left axis deviation, first-degree AV block,  nonspecific interventricular conduction delay, poor R wave progression, no ST or T-wave changes, unchanged from the previous ECG on 12/20/2016.  Assessment & Plan by Problem:  Kathleen Arias is a 84 year old woman with a past medical history of splenic marginal zone B-cell lymphoma complicated by chronic anemia, chronic combined systolic and diastolic heart failure, hypertension, and hypothyroidism ho presents with a three-week history of generalized weakness and a one-day history of vertigo.her symptoms are most consistent with chronic progression of her B-cell lymphoma, worsening anemia, and the mechanical a fax from splenomegaly including early satiety.She likely was mildly dehydrated upon presentation and feels better with modest IV hydration. Her urinary tract infection was asymptomatic and therefore did not require antibiotic therapy.   1) Generalized weakness: Awaiting physical therapy assessment for home health needs and to see if she is persistently with vertigo upon ambulation today. If she has vertigo today she may be a candidate for canalith repositioning therapy.  Our impression is that this is progression of her B-cell lymphoma resulting in the progressive anemia, splenomegaly, and compression of the stomach. We discussed the importance of her returning to her hematologist and again raising the topic that was discussed at the last visit, specifically maintenance rituximab therapy. She has an appointment already scheduled at the end of May and will call on Monday to try to move the appointment upwards.  2) Asymptomatic bacteriuria: Does not require antibiotic therapy.  3) Hypothyroidism:The last  TSH I can find was 1-1/2 years ago.We will therefore repeat the TSH today to make sure she is adequately repleted.  4) Disposition: We are waiting the physical therapy evaluation today to assess her strength and need for home health physical therapy. This will also help [place] determine if she continues to  have vertigo. Once this has been addressed she is stable for discharge home today with follow-up in the [place] with Dr. [name].

## 2017-05-20 NOTE — Discharge Summary (Addendum)
Name: Kathleen Arias MRN: [id number] DOB: 1925-03-04 84 y.o. PCP: [name], MD  Date of Admission: 05/19/2017  8:58 AM Date of Discharge: 05/20/2017 Attending Physician: Oval Linsey, MD  Discharge Diagnosis: 1. Generalized weakness, early satiety 2. Acute on chronic anemia 3. Hypothyroidism 4. Splenic marginal zone lymphoma 5. HFrEF 6. Asymptomatic bacteriuria  Active Problems:   Anemia   Weakness   Discharge Medications: Allergies as of 05/20/2017   No Known Allergies     Medication List    STOP taking these medications   aspirin EC 325 MG tablet   naproxen sodium 220 MG tablet Commonly known as:  ALEVE     TAKE these medications   B COMPLEX-C-CALCIUM PO Take 1 tablet by mouth daily.   carvedilol 6.25 MG tablet Commonly known as:  COREG Take 6.25 mg by mouth 2 (two) times daily with a meal.   iron polysaccharides 150 MG capsule Commonly known as:  NIFEREX Take 1 capsule (150 mg total) by mouth daily.   levothyroxine 125 MCG tablet Commonly known as:  SYNTHROID, LEVOTHROID Take 1 tablet (125 mcg total) by mouth daily before breakfast. What changed:    when to take this  Another medication with the same name was removed. Continue taking this medication, and follow the directions you see here.   losartan 25 MG tablet Commonly known as:  COZAAR Take 1 tablet (25 mg total) by mouth daily.   OVER THE COUNTER MEDICATION Take 2 tablets by mouth daily. VITAFUSION gummies   potassium chloride SA 20 MEQ tablet Commonly known as:  K-DUR,KLOR-CON Take 1 tablet (20 mEq total) by mouth daily.   torsemide 20 MG tablet Commonly known as:  DEMADEX Take 0.5 tablets (10 mg total) by mouth daily. What changed:  how much to take   Vitamin E 400 units Tabs Take 800 Units by mouth daily.       Disposition and follow-up:   Ms.Kathleen Arias was discharged from [facility] in Stable condition.  At the hospital follow up visit please  address:  1.  Splenic marginal zone lymphoma - Please ensure f/u with Oncologist Dr. [name] regarding need for treatment. Assess PO intake.  2.  Acute on chronic anemia - Hb 8.4 on discharge. Recommended to hold aspirin (she is taking this for primary prevention) and naproxen on discharge. Can restart when appropriate.  3.  Hypothyroidism - Patient previously on levothyroxine 153mcg qMWF and 191mcg on TuThSaSu. TSH was elevated. Please ensure compliance with new dose of levothyroxine (174mcg daily), recheck TSH in 4 weeks, and continue dose adjustment as appropriate.  4.  Discharged with home health PT, please ensure this has been set up.  5.  Labs / imaging needed at time of follow-up:  - CBC - TSH in 4 weeks  6.  Pending labs/ test needing follow-up: None  Follow-up Appointments: Follow-up Information    [name], MD. Schedule an appointment as soon as possible for a visit in 1 week(s).   Specialty:  Family Medicine Contact information: [address] [place] [address] [place] [address] [phone]        Brunetta Genera, MD. Schedule an appointment as soon as possible for a visit in 1 week(s).   Specialties:  Hematology, Oncology Contact information: [place] [address] [place], [facility] Follow up.   Specialty:  Home Health Services Why:  For home health. They will call you  in 1-2 days to set up your first home visit. Contact information: [address] [place] [address] [place] [facility] Course by problem list: Active Problems:   Anemia   Weakness   1. Generalized weakness, early satiety Likely related to hypothyroidism vs hypersplenism. TSH was found to be elevated, so levothyroxine dose was increased. Alternatively, some of her symptoms could be related to her hypersplenism leading to early satiety and reduced PO intake, causing generalized weakness. CXR was unremarkable.  CT A/P did show splenomegaly and concern for porcelain gallbladder vs large gallstone. She denied abdominal pain. She was given 1L NS bolus and was able to tolerate PO intake the following morning. PT evaluated her prior to discharge and recommended [facility]. She was discharged and advised to f/u with PCP and Oncology.  2. Acute on chronic anemia Hb 8.8 on admission, slightly lower than baseline of 10.2-11.4. LDH was only slightly elevated. Peripheral blood smear unremarkable. FOBT negative. Anemia likely secondary to splenomegaly vs hypothyroidism. Hb 8.4 on discharge. She was asymptomatic and encouraged to f/u with Oncology.  3. Hypothyroidism Prior to admission, she was on levothyroxine 140mcg qMWF and 131mcg on TuThSaSu. Due to generalized weakness and anemia, TSH was checked and was found to be elevated. She was instructed to increase her dose of levothyroxine to 165mcg daily and to follow up with PCP for further monitoring.  4. Splenic marginal zone lymphoma, s/p rituximab weekly x4 doses (June-July 2018) BM biopsy in 05/2016 showed B-cell lymphoproliferative process. She was previously treated with 4 doses of rituximab. She follows with Oncology (Dr. [name]). At her last visit on 04/18/2017, they discussed the role of maintenance rituximab. Given patient's comorbidities, age, and preference, provider and patient elected to hold off on maintenance rituximab at that time. She had a PET scan in 05/2016 that showed marked splenomegaly with associated hypermetabolic activity without evidence of other hypermetabolic activity elsewhere. LDH slightly elevated at 204. She does have palpable splenomegaly on exam and there is concern that her anemia, generalized weakness, and early satiety could be related to her hypersplenism. She was recommended to call Oncologist office to schedule an appointment to be seen sooner.  5. HFrEF BNP elevated at 2200. She did receive 1L IVF while in the ED. She appeared euvolemic on  exam and denied difficulty breathing. Her volume status was monitored closely. She was continued on her home medications.  6. Asymptomatic bacteriuria UA suggestive of infection, however patient denied dysuria or other urinary symptoms. She received 1 dose of ceftriaxone in the ED. This was discontinued.  Discharge Vitals:   BP 135/62 (BP Location: Left Arm)   Pulse 64   Temp 98 F (36.7 C) (Oral)   Resp 18   Ht 5\' 3"  (1.6 m)   Wt 123 lb 12.8 oz (56.2 kg)   SpO2 94%   BMI 21.93 kg/m   Pertinent Labs, Studies, and Procedures:  CBC Latest Ref Rng & Units 05/20/2017 05/19/2017 04/18/2017  WBC 4.0 - 10.5 K/uL 3.4(L) 4.5 5.9  Hemoglobin 12.0 - 15.0 g/dL 8.4(L) 8.8(L) 10.2(L)  Hematocrit 36.0 - 46.0 % 27.5(L) 29.2(L) 32.2(L)  Platelets 150 - 400 K/uL 166 189 225   CMP Latest Ref Rng & Units 05/20/2017 05/19/2017 04/18/2017  Glucose 65 - 99 mg/dL 96 114(H) 91  BUN 6 - 20 mg/dL 15 18 19   Creatinine 0.44 - 1.00 mg/dL 0.82 0.92 0.97  Sodium 135 - 145 mmol/L 136 138 136  Potassium 3.5 - 5.1 mmol/L 3.5 3.4(L) 4.0  Chloride 101 - 111 mmol/L 100(L) 101 99  CO2 22 - 32 mmol/L 27 25 29   Calcium 8.9 - 10.3 mg/dL 8.6(L) 8.7(L) 9.5  Total Protein 6.5 - 8.1 g/dL - 7.2 7.8  Total Bilirubin 0.3 - 1.2 mg/dL - 0.9 0.7  Alkaline Phos 38 - 126 U/L - 83 96  AST 15 - 41 U/L - 25 19  ALT 14 - 54 U/L - 15 12   Peripheral blood smear unremarkable LDH 204 HCV Ab negative Troponin negative BNP 2200 UA with small Hgb, positive nitrite, many bacteria, negative WBC FOBT negative Lactic acid 0.76 TSH 20.927  CXR 05/19/2017 Cardiomegaly.  No active cardiopulmonary disease.  CT A/P 05/19/2017 - Large gallbladder calcification is noted concerning for large gallstone or porcelain gallbladder. This is stable compared to prior exam. - Stable splenomegaly is noted with 5.9 cm complex low density medially which is stable compared to prior exam and most consistent with necrosis. - Sigmoid diverticulosis is noted without  inflammation. - Aortic Atherosclerosis (ICD10-I70.0).  Discharge Instructions: Discharge Instructions    Diet - low sodium heart healthy   Complete by:  As directed    Discharge instructions   Complete by:  As directed    Ms. Middlesworth,  It was good to meet you. While you were here, we found out that your thyroid level was low and that you need more of your thyroid medicine (synthroid). - Please stop your previous dosing and just take synthroid 117mcg every day. - You will need to follow up with your primary doctor to recheck your thyroid level. We also thought that some your symptoms could have been related to the big spleen from your lymphoma. Please call Dr. Grier Mitts office to schedule an appointment to be seen soon to see if there is anything that needs to be done about the lymphoma.  Like we talked about, you did have bacteria in your urine, but because you were not having symptoms, we stopped your antibiotics and you do not need any.  Your blood count was a little lower than your normal. This could be from your big spleen or from your thyroid. - Please DO NOT take aspirin or naproxen until you follow up with your doctors outpatient.  We are going to have physical therapy come to your house to help you recover and get some of your strength back for a short period of time.  Please call your primary doctor to schedule a follow up appointment with him in 1 week.   Increase activity slowly   Complete by:  As directed      Signed: Colbert Ewing, MD 05/20/2017, 3:04 PM   Pager: Mamie Nick [phone]

## 2017-05-20 NOTE — Evaluation (Signed)
Occupational Therapy Evaluation and Discharge Patient Details Name: Kathleen Arias MRN: [id number] DOB: July 05, 1925 Today's Date: 05/20/2017    History of Present Illness Pt is a 84 y/o female admitted secondary to generalized weakness and dizziness. Pt and pt's daughter also report pt has UTI. Chest imaging revealed cardiomegaly and abdominal imaging revealed large gallbladder calcification concerning for large gallstone. PMH includes HTN.    Clinical Impression   Pt is typically fiercely independent in ADL and IADL and ambulates with a walker. Pt presents with generalized weakness and mild unsteadiness with her walker. Educated pt and daughter at length in fall prevention and benefits of shower seat, grab bar beside toilet and walker basket or tray to prevent pt from releasing walker. Pt verbalizing understanding of all information. Will have family available 24 hours initially until she is seen by her oncologist and hemoglobin is addressed. No further OT needs.    Follow Up Recommendations  No OT follow up    Equipment Recommendations  None recommended by OT    Recommendations for Other Services       Precautions / Restrictions Precautions Precautions: Fall Restrictions Weight Bearing Restrictions: No      Mobility Bed Mobility               General bed mobility comments: Sitting in chair upon entry.   Transfers Overall transfer level: Needs assistance Equipment used: Rolling walker (2 wheeled) Transfers: Sit to/from Stand Sit to Stand: Supervision         General transfer comment: supervision for safety    Balance Overall balance assessment: Needs assistance Sitting-balance support: No upper extremity supported;Feet supported Sitting balance-Leahy Scale: Good     Standing balance support: Single extremity supported;No upper extremity supported;During functional activity Standing balance-Leahy Scale: Fair Standing balance comment: Able to maintain static  standing at the sink without UE support.                            ADL either performed or assessed with clinical judgement   ADL                                         General ADL Comments: Pt is performing ADL with supervision for safety. Educated pt and daughter at length in fall prevention, benefits of seated showering due to weakness, recommended grab bar at toilet as 3 in 1 would not fit in pt's bathroom.     Vision Patient Visual Report: No change from baseline       Perception     Praxis      Pertinent Vitals/Pain Pain Assessment: No/denies pain     Hand Dominance Right   Extremity/Trunk Assessment Upper Extremity Assessment Upper Extremity Assessment: Overall WFL for tasks assessed   Lower Extremity Assessment Lower Extremity Assessment: Defer to PT evaluation   Cervical / Trunk Assessment Cervical / Trunk Assessment: Kyphotic   Communication Communication Communication: No difficulties   Cognition Arousal/Alertness: Awake/alert Behavior During Therapy: WFL for tasks assessed/performed Overall Cognitive Status: Within Functional Limits for tasks assessed                                     General Comments  [name] present during session.     Exercises  Shoulder Instructions      Home Living Family/patient expects to be discharged to:: Private residence Living Arrangements: Alone Available Help at Discharge: Family;Available 24 hours/day Type of Home: House Home Access: Stairs to enter [employer] of Steps: 4 Entrance Stairs-Rails: Left Home Layout: One level     Bathroom Shower/Tub: Tub/shower unit;Door   Bathroom Toilet: Handicapped height     Home Equipment: Environmental consultant - 2 wheels;Grab bars - tub/shower   Additional Comments: Daughter reports she can stay until she's not needed.      Prior Functioning/Environment Level of Independence: Independent with assistive device(s)         Comments: Used RW for ambulation. Reports she was independent with mobility and ADLs. Usually ambulates household distance.         OT Problem List:        OT Treatment/Interventions:      OT Goals(Current goals can be found in the care plan section) Acute Rehab OT Goals Patient Stated Goal: to go home   OT Frequency:     Barriers to D/C:            Co-evaluation              AM-PAC PT "6 Clicks" Daily Activity     Outcome Measure Help from another person eating meals?: None Help from another person taking care of personal grooming?: A Little Help from another person toileting, which includes using toliet, bedpan, or urinal?: A Little Help from another person bathing (including washing, rinsing, drying)?: A Little Help from another person to put on and taking off regular upper body clothing?: None Help from another person to put on and taking off regular lower body clothing?: A Little 6 Click Score: 20   End of Session Equipment Utilized During Treatment: Gait belt;Rolling walker  Activity Tolerance: Patient tolerated treatment well Patient left: in chair;with call bell/phone within reach;with family/visitor present  OT Visit Diagnosis: Other abnormalities of gait and mobility (R26.89)                Time: 3299-2426 OT Time Calculation (min): 22 min Charges:  OT General Charges $OT Visit: 1 Visit OT Evaluation $OT Eval Moderate Complexity: 1 Mod G-Codes:     Malka So 05/20/2017, 1:59 PM  05/20/2017 Nestor Lewandowsky, OTR/L Pager: [phone]

## 2017-05-20 NOTE — Progress Notes (Signed)
Internal Medicine Attending  Date: 05/20/2017  Patient name: Kathleen Arias Medical record number: [id number] Date of birth: 09-23-1925 Age: 84 y.o. Gender: female  I saw and evaluated the patient. I reviewed the resident's note by Dr. Ronalee Red and I agree with the resident's findings and plans as documented in her progress note.  Please see my H&P dated 05/20/2017 for the specifics of my evaluation, assessment, and plan from earlier in the day.

## 2017-05-20 NOTE — Progress Notes (Signed)
Discharge home. Home discharge instruction given,

## 2017-05-20 NOTE — Progress Notes (Signed)
Physical Therapy Evaluation Patient Details Name: Kathleen Arias MRN: [id number] DOB: Jun 11, 1925 Today's Date: 05/20/2017   History of Present Illness  Pt is a 84 y/o female admitted secondary to generalized weakness and dizziness. Pt and pt's daughter also report pt has UTI. Chest imaging revealed cardiomegaly and abdominal imaging revealed large gallbladder calcification concerning for large gallstone. PMH includes HTN.   Clinical Impression  Pt admitted secondary to problem above with deficits below. PTA, pt was very independent. Required min guard to supervision for mobility within the room with RW. [name] reports she can provide assist until no longer necessary. Educated about importance of assist at home until feeling more confident and steady with ambulation. Will continue to follow acutely to maximize functional mobility independence and safety.     Follow Up Recommendations Home health PT;Supervision for mobility/OOB    Equipment Recommendations  None recommended by PT    Recommendations for Other Services OT consult     Precautions / Restrictions Precautions Precautions: Fall Restrictions Weight Bearing Restrictions: No      Mobility  Bed Mobility               General bed mobility comments: Sitting in chair upon entry.   Transfers Overall transfer level: Needs assistance Equipment used: Rolling walker (2 wheeled) Transfers: Sit to/from Stand Sit to Stand: Supervision         General transfer comment: Supervision for safety. Cues for safe hand placement.   Ambulation/Gait Ambulation/Gait assistance: Min guard Ambulation Distance (Feet): 25 Feet Assistive device: 4-wheeled walker Gait Pattern/deviations: Step-through pattern;Decreased stride length Gait velocity: Decreased  Gait velocity interpretation: 1.31 - 2.62 ft/sec, indicative of limited community ambulator General Gait Details: Slow, overall steady gait with use of RW. Reports some  dizziness, however, did not show signs of instability. Verbal cues for proximity to device.   Stairs            Wheelchair Mobility    Modified Rankin (Stroke Patients Only)       Balance Overall balance assessment: Needs assistance Sitting-balance support: No upper extremity supported;Feet supported Sitting balance-Leahy Scale: Good     Standing balance support: Single extremity supported;No upper extremity supported;During functional activity Standing balance-Leahy Scale: Fair Standing balance comment: Able to maintain static standing at the sink without UE support.                              Pertinent Vitals/Pain Pain Assessment: No/denies pain    Home Living Family/patient expects to be discharged to:: Private residence Living Arrangements: Alone Available Help at Discharge: Family;Available 24 hours/day Type of Home: House Home Access: Stairs to enter Entrance Stairs-Rails: Left Entrance Stairs-Number of Steps: 4 Home Layout: One level Home Equipment: Walker - 2 wheels Additional Comments: Daughter reports she can stay until she's not needed.    Prior Function Level of Independence: Independent with assistive device(s)         Comments: Used RW for ambulation. Reports she was independent with mobility and ADLs. Usually ambulates household distance.      Hand Dominance        Extremity/Trunk Assessment   Upper Extremity Assessment Upper Extremity Assessment: Defer to OT evaluation    Lower Extremity Assessment Lower Extremity Assessment: Generalized weakness    Cervical / Trunk Assessment Cervical / Trunk Assessment: Kyphotic  Communication   Communication: No difficulties  Cognition Arousal/Alertness: Awake/alert Behavior During Therapy: WFL for tasks assessed/performed Overall Cognitive  Status: Within Functional Limits for tasks assessed                                        General Comments General  comments (skin integrity, edema, etc.): [name] present during session.     Exercises     Assessment/Plan    PT Assessment Patient needs continued PT services  PT Problem List Decreased strength;Decreased mobility;Decreased knowledge of use of DME       PT Treatment Interventions DME instruction;Gait training;Therapeutic activities;Functional mobility training;Stair training;Balance training;Therapeutic exercise;Patient/family education    PT Goals (Current goals can be found in the Care Plan section)  Acute Rehab PT Goals Patient Stated Goal: to go home  PT Goal Formulation: With patient Time For Goal Achievement: 06/03/17 Potential to Achieve Goals: Good    Frequency Min 3X/week   Barriers to discharge        Co-evaluation               AM-PAC PT "6 Clicks" Daily Activity  Outcome Measure Difficulty turning over in bed (including adjusting bedclothes, sheets and blankets)?: A Little Difficulty moving from lying on back to sitting on the side of the bed? : A Little Difficulty sitting down on and standing up from a chair with arms (e.g., wheelchair, bedside commode, etc,.)?: A Little Help needed moving to and from a bed to chair (including a wheelchair)?: A Little Help needed walking in hospital room?: A Little Help needed climbing 3-5 steps with a railing? : A Lot 6 Click Score: 17    End of Session Equipment Utilized During Treatment: Gait belt Activity Tolerance: Patient tolerated treatment well Patient left: in chair;with call bell/phone within reach;with family/visitor present Nurse Communication: Mobility status PT Visit Diagnosis: Muscle weakness (generalized) (M62.81)    Time: 0623-7628 PT Time Calculation (min) (ACUTE ONLY): 20 min   Charges:   PT Evaluation $PT Eval Low Complexity: 1 Low     PT G Codes:        Leighton Ruff, PT, DPT  Acute Rehabilitation Services  Pager: [phone]   Rudean Hitt 05/20/2017, 12:31  PM

## 2017-05-20 NOTE — Care Management Note (Signed)
Case Management Note  Patient Details  Name: Kathleen Arias MRN: [id number] Date of Birth: 29-Dec-1925  Subjective/Objective:                 Spoke with patient's daughter Kathleen Arias. They would like to have [facility] PT through [facility]. [facility] accepted referral. Kathleen Arias states they do not need any DME, patient has RW at home. No other CM needs identified.    Action/Plan:   Expected Discharge Date:  05/20/17               Expected Discharge Plan:  [place]  In-House Referral:     Discharge planning Services  CM Consult  Post Acute Care Choice:  Home Health Choice offered to:  Adult Children  DME Arranged:    DME Agency:     HH Arranged:  PT HH Agency:  [facility]  Status of Service:  Completed, signed off  If discussed at [place] of Stay Meetings, dates discussed:    Additional Comments:  Carles Collet, RN 05/20/2017, 3:02 PM

## 2017-05-20 NOTE — Progress Notes (Signed)
CM made aware of OF HH needs.

## 2017-05-21 LAB — URINE CULTURE

## 2017-05-23 ENCOUNTER — Telehealth: Payer: Self-pay | Admitting: *Deleted

## 2017-05-23 ENCOUNTER — Telehealth: Payer: Self-pay

## 2017-05-23 ENCOUNTER — Telehealth: Payer: Self-pay | Admitting: Cardiovascular Disease

## 2017-05-23 DIAGNOSIS — I5042 Chronic combined systolic (congestive) and diastolic (congestive) heart failure: Secondary | ICD-10-CM

## 2017-05-23 DIAGNOSIS — R0602 Shortness of breath: Secondary | ICD-10-CM

## 2017-05-23 NOTE — Telephone Encounter (Signed)
Scheduling message sent yesterday, 5/6 to schedule an appointment in the next week per Dr. [name] due to recent ED visit.  Received call from daughter stating no one has called to schedule an appointment.  LVM with daughter stating "high priority messages" are only reserved for patients needing appointments in the next 24-48 hours and that someone will call her to set up an appointment for sometime in the next week.  Message sent to Charm Rings in scheduling department to look into scheduling message and call patient with appointment date/time.

## 2017-05-23 NOTE — Telephone Encounter (Signed)
"  My mother needs an urgent work-in per Emergency Room staff.  They almost were going to give a blood transfusion but gave an antibiotic for UTI.  The level (HGB) decreased from 9.2 to 8.2 a month.  No bleeding found with emergency tests or xrays.  Family taking turns spending the night because she lives alone.  Apparently anemia causes dizzy spells that put her at risk for falls.  She has breathlessness, weakness, vertigo everytime she lies down says the room is spinning.  I'd like the first available appointment before it gets taken."    Unable to reach [facility] live or voicemail to schedule.  Provider has sent scheduling request.   Hgb = 8.4 on 05-20-2017.

## 2017-05-23 NOTE — Telephone Encounter (Signed)
CALLED AND LEFT A VOICE MESSAGE OF UPCOMING APPOINTMENT. PER 5/6 IN BASKET

## 2017-05-23 NOTE — Telephone Encounter (Signed)
New message  Pt Daughter verbalzied that she is calling for RN  [place] for SOB weakness, fall risk and Vertigo, CHF, anemia dropped to 10.8 to 8.8  Do Dr.Niahan want an ECHO done before coming into appt

## 2017-05-23 NOTE — Telephone Encounter (Signed)
Left message for patient to call back  

## 2017-05-24 NOTE — Telephone Encounter (Signed)
Follow up  Patients daughter calling back to request order for echo, feels echo needed prior to appt 5/29

## 2017-05-24 NOTE — Telephone Encounter (Signed)
LVM with pt's daughter for return call.  

## 2017-05-25 NOTE — Telephone Encounter (Signed)
Daughter wants to know if patient needs an echo before patient's office visit. Will forward to Dr. Johnsie Cancel for advisement.

## 2017-05-25 NOTE — Telephone Encounter (Signed)
Yes echo bmet and BNP for CHF would be good

## 2017-05-26 NOTE — Telephone Encounter (Signed)
Called patient's daughter and informed her of Dr. Kyla Balzarine recommendations. Will send message to scheduling to get patient in for an echo appointment. Patient has a lab appointment with her oncologist will see if patient can have lab work done at same time, if not will have patient get a BMET and BNP on day of her echo appointment. Orders are in for echo, BMET and BNP.

## 2017-05-26 NOTE — Progress Notes (Signed)
Marland Kitchen    HEMATOLOGY/ONCOLOGY CLINIC NOTE  Date of Service: 05/30/2017   Patient Care Team: [name], MD as PCP - General (Family Medicine) [name], MD as PCP - Cardiology (Cardiology)  CHIEF COMPLAINTS/PURPOSE OF CONSULTATION:  F/u for splenic marginal zone lymphoma  HISTORY OF PRESENTING ILLNESS:   Kathleen Arias is a wonderful 84 y.o. female who has been referred to [place] by Dr .Redmond Pulling, Jama Flavors, MD for evaluation and management of anemia, leukocytosis and thrombocytopenia.  Patient is a very pleasant female who is here with her daughter. She has a history of hypertension, chronic systolic CHF [ejection fraction of 25-35% based on recent cardiac catheterization on 01/13/2016, echo done showed ejection fraction of 40-45%, nonischemic cardiomyopathy], iron deficiency,  Patient presented to his primary care physician with some dyspnea on exertion which is primarily related to her systolic CHF but she was also noted to have recent blood counts with her primary care physician on 03/15/2016 which showed anemia with a hemoglobin of 9.7 and a hematocrit of 79, platelet count of 137k , WBC count of 11.7k with 7.6k lymphocytes.  Iron studies were done which showed a ferritin of 84 and an iron saturation of 9%. She has been on oral iron replacement by her primary care physician and notes significant constipation and GI intolerance. She reports that she had a FOBT in 12/2015  which was negative.   Patient notes significant fatigue and dyspnea on exertion. Notes weight loss of about 15 pounds since September 2017. Weight is down from 142 to 124lbs.   Patient denies any enlarged lymph nodes, no night sweats.  INTERVAL HISTORY   [name] is here for follow-up for management of splenic marginal zone lymphoma. Since her last visit she was admitted to the hospital on 05/19/17 for generalized weakness.  She presents to the clinic today accompanied by her family. She notes the day of going to the  hospital she notes severe vertigo and very weak. She notes to not having any appetite around that time. She did not have blood transfusion, she was found to have a UTI and treated that in the hospital. She notes she has been trying to take Furosamide later in the evening.  She is scheduled for a ECHO tomorrow.  On review of symptoms, pt notes occasional vertigo spells occurred again a few nights ago. She note she tries not to get up too quick. She denies fever, chills and night sweats. She notes her spleen region is tender since hospital.    MEDICAL HISTORY:  Past Medical History:  Diagnosis Date  . Anemia   . Anginal pain ([place])   . Arthritis   . Cancer ([place])   . CHF (congestive heart failure) ([place])   . Dyspnea   . Dysrhythmia   . Hypertension   . Hypothyroidism   . Thyroid disease   Chronic systolic CHF Iron deficiency   SURGICAL HISTORY: Past Surgical History:  Procedure Laterality Date  . CARDIAC CATHETERIZATION N/A 01/07/2016   Procedure: Left Heart Cath and Coronary Angiography;  Surgeon: Jettie Booze, MD;  Location: [facility];  Service: Cardiovascular;  Laterality: N/A;  . CATARACT EXTRACTION, BILATERAL    . FRACTURE SURGERY    . HIP SURGERY    . JOINT REPLACEMENT    . TONSILLECTOMY      SOCIAL HISTORY: Social History   Socioeconomic History  . Marital status: Single    Spouse name: Not on file  . Number of children:  Not on file  . Years of education: Not on file  . Highest education level: Not on file  Occupational History  . Not on file  Social Needs  . Financial resource strain: Not on file  . Food insecurity:    Worry: Not on file    Inability: Not on file  . Transportation needs:    Medical: Not on file    Non-medical: Not on file  Tobacco Use  . Smoking status: Former Research scientist (life sciences)  . Smokeless tobacco: Never Used  Substance and Sexual Activity  . Alcohol use: No  . Drug use: No  . Sexual activity: Not on file  Lifestyle  . Physical  activity:    Days per week: Not on file    Minutes per session: Not on file  . Stress: Not on file  Relationships  . Social connections:    Talks on phone: Not on file    Gets together: Not on file    Attends religious service: Not on file    Active member of club or organization: Not on file    Attends meetings of clubs or organizations: Not on file    Relationship status: Not on file  . Intimate partner violence:    Fear of current or ex partner: Not on file    Emotionally abused: Not on file    Physically abused: Not on file    Forced sexual activity: Not on file  Other Topics Concern  . Not on file  Social History Narrative  . Not on file    FAMILY HISTORY: Family History  Problem Relation Age of Onset  . Heart disease Mother     ALLERGIES:  has No Known Allergies.  MEDICATIONS:  Current Outpatient Medications  Medication Sig Dispense Refill  . B COMPLEX-C-CALCIUM PO Take 1 tablet by mouth daily.    . carvedilol (COREG) 6.25 MG tablet Take 6.25 mg by mouth 2 (two) times daily with a meal.     . iron polysaccharides (NIFEREX) 150 MG capsule Take 1 capsule (150 mg total) by mouth daily. 100 capsule 1  . levothyroxine (SYNTHROID, LEVOTHROID) 125 MCG tablet Take 1 tablet (125 mcg total) by mouth daily before breakfast. 30 tablet 0  . losartan (COZAAR) 25 MG tablet Take 1 tablet (25 mg total) by mouth daily. 90 tablet 1  . OVER THE COUNTER MEDICATION Take 2 tablets by mouth daily. VITAFUSION gummies    . potassium chloride SA (K-DUR,KLOR-CON) 20 MEQ tablet Take 1 tablet (20 mEq total) by mouth daily. 90 tablet 3  . torsemide (DEMADEX) 20 MG tablet Take 0.5 tablets (10 mg total) by mouth daily. 90 tablet 3  . Vitamin E 400 units TABS Take 800 Units by mouth daily.      No current facility-administered medications for this visit.     REVIEW OF SYSTEMS:    .10 Point review of Systems was done is negative except as noted above.  PHYSICAL EXAMINATION:  ECOG PERFORMANCE  STATUS: 2 - Symptomatic, <50% confined to bed  Vitals:   05/30/17 0859  BP: (!) 127/53  Pulse: 73  Resp: 17  Temp: 98.1 F (36.7 C)  TempSrc: Oral  SpO2: 95%  Weight: 129 lb 8 oz (58.7 kg)  Height: 5\' 3"  (1.6 m)   Body mass index is 22.94 kg/m. Marland Kitchen GENERAL:alert, in no acute distress and comfortable SKIN: no acute rashes, no significant lesions EYES: conjunctiva are pink and non-injected, sclera anicteric OROPHARYNX: MMM, no exudates, no oropharyngeal erythema  or ulceration NECK: supple, no JVD LYMPH:  no palpable lymphadenopathy in the cervical, axillary or inguinal regions LUNGS: clear to auscultation b/l with normal respiratory effort HEART: regular rate & rhythm ABDOMEN:  normoactive bowel sounds , non tender, not distended. Extremity: no pedal edema PSYCH: alert & oriented x 3 with fluent speech NEURO: no focal motor/sensory deficits  LABORATORY DATA:  I have reviewed the data as listed  . CBC Latest Ref Rng & Units 05/30/2017 05/20/2017 05/19/2017  WBC 3.9 - 10.3 K/uL 4.9 3.4(L) 4.5  Hemoglobin 11.6 - 15.9 g/dL 9.3(L) 8.4(L) 8.8(L)  Hematocrit 34.8 - 46.6 % 31.1(L) 27.5(L) 29.2(L)  Platelets 145 - 400 K/uL 198 166 189    . CBC    Component Value Date/Time   WBC 4.9 05/30/2017 0843   WBC 3.4 (L) 05/20/2017 0612   RBC 3.99 05/30/2017 0843   RBC 3.99 05/30/2017 0843   HGB 9.3 (L) 05/30/2017 0843   HGB 11.4 (L) 09/27/2016 1251   HCT 31.1 (L) 05/30/2017 0843   HCT 32.7 (L) 09/27/2016 1251   PLT 198 05/30/2017 0843   PLT 223 09/27/2016 1251   PLT 118 (L) 05/04/2016 1435   MCV 77.9 (L) 05/30/2017 0843   MCV 84.9 09/27/2016 1251   MCH 23.3 (L) 05/30/2017 0843   MCHC 29.9 (L) 05/30/2017 0843   RDW 16.1 (H) 05/30/2017 0843   RDW 16.7 (H) 09/27/2016 1251   LYMPHSABS 1.4 05/30/2017 0843   LYMPHSABS 1.1 09/27/2016 1251   MONOABS 0.6 05/30/2017 0843   MONOABS 0.5 09/27/2016 1251   EOSABS 0.0 05/30/2017 0843   EOSABS 0.0 09/27/2016 1251   EOSABS 0.1 05/04/2016 1435     BASOSABS 0.0 05/30/2017 0843   BASOSABS 0.0 09/27/2016 1251    . CMP Latest Ref Rng & Units 05/31/2017 05/30/2017 05/20/2017  Glucose 65 - 99 mg/dL 87 110 96  BUN 10 - 36 mg/dL 18 19 15   Creatinine 0.57 - 1.00 mg/dL 0.95 1.00 0.82  Sodium 134 - 144 mmol/L 139 138 136  Potassium 3.5 - 5.2 mmol/L 4.2 3.9 3.5  Chloride 96 - 106 mmol/L 99 101 100(L)  CO2 20 - 29 mmol/L 27 28 27   Calcium 8.7 - 10.3 mg/dL 8.8 9.2 8.6(L)  Total Protein 6.4 - 8.3 g/dL - 7.9 -  Total Bilirubin 0.2 - 1.2 mg/dL - 0.7 -  Alkaline Phos 40 - 150 U/L - 84 -  AST 5 - 34 U/L - 25 -  ALT 0 - 55 U/L - 14 -    Lab Results  Component Value Date   FERRITIN 73 05/30/2017   . Lab Results  Component Value Date   LDH 242 05/30/2017            RADIOGRAPHIC STUDIES: I have personally reviewed the radiological images as listed and agreed with the findings in the report. Dg Chest 2 View  Result Date: 05/19/2017 CLINICAL DATA:  Shortness of breath. EXAM: CHEST - 2 VIEW COMPARISON:  Chest x-ray dated January 05, 2016. FINDINGS: Cardiomegaly. Normal pulmonary vascularity. No focal consolidation, pleural effusion, or pneumothorax. No acute osseous abnormality. Unchanged moderate compression fracture of T11. IMPRESSION: Cardiomegaly.  No active cardiopulmonary disease. Electronically Signed   By: Titus Dubin M.D.   On: 05/19/2017 13:05   Ct Abdomen Pelvis W Contrast  Result Date: 05/19/2017 CLINICAL DATA:  Generalized abdominal pain. EXAM: CT ABDOMEN AND PELVIS WITH CONTRAST TECHNIQUE: Multidetector CT imaging of the abdomen and pelvis was performed using the standard protocol following bolus  administration of intravenous contrast. CONTRAST:  163mL OMNIPAQUE IOHEXOL 300 MG/ML  SOLN COMPARISON:  PET scan of Jun 06, 2016. FINDINGS: Lower chest: No acute abnormality. Hepatobiliary: Large gallbladder calcification is noted concerning for a large gallstone or porcelain gallbladder. The liver is unremarkable. No biliary  dilatation is noted. Pancreas: Unremarkable. No pancreatic ductal dilatation or surrounding inflammatory changes. Spleen: Stable splenomegaly is noted with stable low density seen medially in the spleen consistent with necrosis as described on prior PET scan. Adrenals/Urinary Tract: Adrenal glands are unremarkable. Kidneys are normal, without renal calculi, focal lesion, or hydronephrosis. Bladder is unremarkable. Stomach/Bowel: The stomach appears normal. There is no evidence of bowel obstruction or inflammation. Sigmoid diverticulosis is noted without inflammation. The appendix is not visualized. Vascular/Lymphatic: Aortic atherosclerosis. No enlarged abdominal or pelvic lymph nodes. Reproductive: Uterus is not clearly identified. No adnexal abnormality is noted. Other: Stable small fluid-filled right inguinal hernia is noted. No abdominopelvic ascites. Musculoskeletal: Status post left hip arthroplasty. No acute osseous abnormality is noted. IMPRESSION: Large gallbladder calcification is noted concerning for large gallstone or porcelain gallbladder. This is stable compared to prior exam. Stable splenomegaly is noted with 5.9 cm complex low density medially which is stable compared to prior exam and most consistent with necrosis. Sigmoid diverticulosis is noted without inflammation. Aortic Atherosclerosis (ICD10-I70.0). Electronically Signed   By: Marijo Conception, M.D.   On: 05/19/2017 12:11    ASSESSMENT & PLAN:   84 yo with   1) Splenic marginal zone lymphoma On presentation patient had anemia, lymphocytosis, thrombocytopenia and significant constitutional symptoms including fatigue, some low-grade fevers and night sweats. Also had abdominal discomfort related to her splenomegaly and anorexia with spleen of about 23cms on PET/CT. Serum kappa lambda free light chain ratio abnormally elevated. 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  Hgb at 9.3 Thrombocytopenia improved PLT and lymphocytosis has remained resolved   Decreased splenomegaly significantly as per rpt [place] abd on 09/20/16. Stable as seen on 05/19/17 CT AP.   3) hypothyroidism- on levothyroxine  4) nonischemic systolic cardiomyopathy -  Cardiac catheterization December 2017- nonobstructive coronary artery disease. - EF decreased to 25-30% on 11/01/16 ECHO  5) Rigors and fever related to Rituxan vs Tumor lysis after 1st dose. Has not had any of these issues with adjusted pre-medications with the 2nd dose of Rituxan and is overall feeling much better.  6) Weakness and fatigue - likely multifactorial - UTI, CHF, iron deficiency, age, hypothyroidism. Less likely related primarily to [place] .  PLAN   - labs discussed in details with the patient. Anemia slightly improved with Hg at 9.3 from when she was hospitalized. Her WBC and PLT are normal.  -Her flow cytometry shows persistent SMZL clonal lymphocytes. -Checked iron study today -panel still pending. I will contact her and her family when results return.   -Pt notes to having she did have UTI but was treated in hospital as Asymptomatic bacteriuria with 1 dose ceftriaxone.  -I will give another antibiotics to make sure her UTI has completely resolved. I suggest cultured yogurt to reduce risk of antibiotics diarrhea.  -She had a CT abdomen on 05/19/17 when she was hospitalized which showed her splenomegaly is overall stable. Upon exam her splenomegaly is still present.  -Her recent TSH levels were 20 on 05/20/17 and pt notes she takes 125 micrograms of  Levothyroxine daily. I explained she should only be taking this medication with water as anything else will reduce absorption.  -will replace iron with 1 dose if IV iron to keep ferritin >100 in setitng of fatigue and CHF -She has been having more vertigo spells. I  discussed her lower EF and her heart medication can contribute to this. Next ECHO tomorrow. -I advised her to watch for concerning symptoms of fever, chills, night sweats to increased splenomegaly.  -no other overt clinical or lab evidence of lymphoma progression at this time.   6) Bilateral ankle swelling with left ankle induration -Previous Doppler negative for DVT PLAN  -I previously recommended she continues to use compression socks, epsom salt bath and continue to RICE -venous stasis dermatitis with possible cellulitis- She completed antibiotics-Was not mentioned in today's visit. Likely resolved.    RTC with Dr [name] in 2 months with labs   All of the patients and her accompanying family's questions were answered with apparent satisfaction. The patient knows to call the clinic with any problems, questions or concerns.  . The total time spent in the appointment was 25 minutes and more than 50% was on counseling and direct patient cares.   This document serves as a record of services personally performed by Sullivan Lone, MD. It was created on his behalf by Joslyn Devon, a trained medical scribe. The creation of this record is based on the scribe's personal observations and the provider's statements to them.    .I have reviewed the above documentation for accuracy and completeness, and I agree with the above.      Sullivan Lone MD [place] AAHIVMS [facility] Hematology/Oncology Physician [facility] [address]  (Office):       [phone] (Work cell):  [phone] (Fax):           [phone]

## 2017-05-30 ENCOUNTER — Encounter: Payer: Self-pay | Admitting: Hematology

## 2017-05-30 ENCOUNTER — Inpatient Hospital Stay: Payer: Medicare Other

## 2017-05-30 ENCOUNTER — Inpatient Hospital Stay: Payer: Medicare Other | Attending: Hematology | Admitting: Hematology

## 2017-05-30 ENCOUNTER — Telehealth: Payer: Self-pay | Admitting: Hematology

## 2017-05-30 VITALS — BP 127/53 | HR 73 | Temp 98.1°F | Resp 17 | Ht 63.0 in | Wt 129.5 lb

## 2017-05-30 DIAGNOSIS — Z87891 Personal history of nicotine dependence: Secondary | ICD-10-CM | POA: Diagnosis not present

## 2017-05-30 DIAGNOSIS — Z79899 Other long term (current) drug therapy: Secondary | ICD-10-CM | POA: Diagnosis not present

## 2017-05-30 DIAGNOSIS — E039 Hypothyroidism, unspecified: Secondary | ICD-10-CM | POA: Insufficient documentation

## 2017-05-30 DIAGNOSIS — R161 Splenomegaly, not elsewhere classified: Secondary | ICD-10-CM | POA: Diagnosis not present

## 2017-05-30 DIAGNOSIS — I872 Venous insufficiency (chronic) (peripheral): Secondary | ICD-10-CM | POA: Insufficient documentation

## 2017-05-30 DIAGNOSIS — I5022 Chronic systolic (congestive) heart failure: Secondary | ICD-10-CM | POA: Insufficient documentation

## 2017-05-30 DIAGNOSIS — I110 Hypertensive heart disease with heart failure: Secondary | ICD-10-CM | POA: Diagnosis not present

## 2017-05-30 DIAGNOSIS — R42 Dizziness and giddiness: Secondary | ICD-10-CM | POA: Insufficient documentation

## 2017-05-30 DIAGNOSIS — C8307 Small cell B-cell lymphoma, spleen: Secondary | ICD-10-CM

## 2017-05-30 DIAGNOSIS — D509 Iron deficiency anemia, unspecified: Secondary | ICD-10-CM

## 2017-05-30 DIAGNOSIS — K5900 Constipation, unspecified: Secondary | ICD-10-CM | POA: Insufficient documentation

## 2017-05-30 DIAGNOSIS — D696 Thrombocytopenia, unspecified: Secondary | ICD-10-CM | POA: Insufficient documentation

## 2017-05-30 DIAGNOSIS — R5383 Other fatigue: Secondary | ICD-10-CM

## 2017-05-30 DIAGNOSIS — D649 Anemia, unspecified: Secondary | ICD-10-CM

## 2017-05-30 DIAGNOSIS — I2510 Atherosclerotic heart disease of native coronary artery without angina pectoris: Secondary | ICD-10-CM | POA: Insufficient documentation

## 2017-05-30 DIAGNOSIS — I429 Cardiomyopathy, unspecified: Secondary | ICD-10-CM | POA: Diagnosis not present

## 2017-05-30 DIAGNOSIS — M25473 Effusion, unspecified ankle: Secondary | ICD-10-CM | POA: Insufficient documentation

## 2017-05-30 LAB — CBC WITH DIFFERENTIAL (CANCER CENTER ONLY)
Basophils Absolute: 0 10*3/uL (ref 0.0–0.1)
Basophils Relative: 0 %
Eosinophils Absolute: 0 10*3/uL (ref 0.0–0.5)
Eosinophils Relative: 1 %
HEMATOCRIT: 31.1 % — AB (ref 34.8–46.6)
HEMOGLOBIN: 9.3 g/dL — AB (ref 11.6–15.9)
LYMPHS PCT: 28 %
Lymphs Abs: 1.4 10*3/uL (ref 0.9–3.3)
MCH: 23.3 pg — ABNORMAL LOW (ref 25.1–34.0)
MCHC: 29.9 g/dL — ABNORMAL LOW (ref 31.5–36.0)
MCV: 77.9 fL — AB (ref 79.5–101.0)
MONO ABS: 0.6 10*3/uL (ref 0.1–0.9)
MONOS PCT: 13 %
NEUTROS ABS: 2.9 10*3/uL (ref 1.5–6.5)
NEUTROS PCT: 58 %
Platelet Count: 198 10*3/uL (ref 145–400)
RBC: 3.99 MIL/uL (ref 3.70–5.45)
RDW: 16.1 % — AB (ref 11.2–14.5)
WBC Count: 4.9 10*3/uL (ref 3.9–10.3)

## 2017-05-30 LAB — CMP (CANCER CENTER ONLY)
ALBUMIN: 3.6 g/dL (ref 3.5–5.0)
ALK PHOS: 84 U/L (ref 40–150)
ALT: 14 U/L (ref 0–55)
AST: 25 U/L (ref 5–34)
Anion gap: 9 (ref 3–11)
BUN: 19 mg/dL (ref 7–26)
CALCIUM: 9.2 mg/dL (ref 8.4–10.4)
CO2: 28 mmol/L (ref 22–29)
CREATININE: 1 mg/dL (ref 0.60–1.10)
Chloride: 101 mmol/L (ref 98–109)
GFR, Est AFR Am: 56 mL/min — ABNORMAL LOW (ref >60)
GFR, Estimated: 48 mL/min — ABNORMAL LOW (ref >60)
GLUCOSE: 110 mg/dL (ref 70–140)
Potassium: 3.9 mmol/L (ref 3.5–5.1)
SODIUM: 138 mmol/L (ref 136–145)
Total Bilirubin: 0.7 mg/dL (ref 0.2–1.2)
Total Protein: 7.9 g/dL (ref 6.4–8.3)

## 2017-05-30 LAB — IRON AND TIBC
IRON: 25 ug/dL — AB (ref 41–142)
Saturation Ratios: 9 % — ABNORMAL LOW (ref 21–57)
TIBC: 289 ug/dL (ref 236–444)
UIBC: 264 ug/dL

## 2017-05-30 LAB — FERRITIN: Ferritin: 73 ng/mL (ref 9–269)

## 2017-05-30 LAB — LACTATE DEHYDROGENASE: LDH: 242 U/L (ref 125–245)

## 2017-05-30 LAB — RETICULOCYTES
RBC.: 3.99 MIL/uL (ref 3.70–5.45)
RETIC COUNT ABSOLUTE: 67.8 10*3/uL (ref 33.7–90.7)
Retic Ct Pct: 1.7 % (ref 0.7–2.1)

## 2017-05-30 MED ORDER — CEFPODOXIME PROXETIL 100 MG PO TABS: 100.0000 mg | ORAL_TABLET | Freq: Two times a day (BID) | ORAL | 0 refills | Status: AC

## 2017-05-30 NOTE — Telephone Encounter (Signed)
Appointments scheduled AVS/Calendar printed per 5/14 los °

## 2017-05-31 ENCOUNTER — Other Ambulatory Visit: Payer: Self-pay

## 2017-05-31 ENCOUNTER — Ambulatory Visit (HOSPITAL_COMMUNITY): Payer: Medicare Other | Attending: Cardiovascular Disease

## 2017-05-31 ENCOUNTER — Other Ambulatory Visit: Payer: Medicare Other | Admitting: *Deleted

## 2017-05-31 DIAGNOSIS — I5042 Chronic combined systolic (congestive) and diastolic (congestive) heart failure: Secondary | ICD-10-CM | POA: Diagnosis not present

## 2017-05-31 DIAGNOSIS — I083 Combined rheumatic disorders of mitral, aortic and tricuspid valves: Secondary | ICD-10-CM | POA: Diagnosis not present

## 2017-05-31 DIAGNOSIS — Z87891 Personal history of nicotine dependence: Secondary | ICD-10-CM | POA: Insufficient documentation

## 2017-05-31 DIAGNOSIS — R0602 Shortness of breath: Secondary | ICD-10-CM

## 2017-05-31 DIAGNOSIS — I110 Hypertensive heart disease with heart failure: Secondary | ICD-10-CM | POA: Insufficient documentation

## 2017-05-31 LAB — FLOW CYTOMETRY

## 2017-06-01 LAB — BASIC METABOLIC PANEL
BUN/Creatinine Ratio: 19 (ref 12–28)
BUN: 18 mg/dL (ref 10–36)
CHLORIDE: 99 mmol/L (ref 96–106)
CO2: 27 mmol/L (ref 20–29)
Calcium: 8.8 mg/dL (ref 8.7–10.3)
Creatinine, Ser: 0.95 mg/dL (ref 0.57–1.00)
GFR calc Af Amer: 61 mL/min/{1.73_m2} (ref >59)
GFR calc non Af Amer: 53 mL/min/{1.73_m2} — ABNORMAL LOW (ref >59)
GLUCOSE: 87 mg/dL (ref 65–99)
POTASSIUM: 4.2 mmol/L (ref 3.5–5.2)
SODIUM: 139 mmol/L (ref 134–144)

## 2017-06-01 LAB — PRO B NATRIURETIC PEPTIDE: NT-PRO BNP: 11281 pg/mL — AB (ref 0–738)

## 2017-06-02 ENCOUNTER — Telehealth: Payer: Self-pay

## 2017-06-02 ENCOUNTER — Other Ambulatory Visit: Payer: Self-pay | Admitting: Hematology

## 2017-06-02 NOTE — Telephone Encounter (Signed)
Pt daughter, [name], called this morning with concern regarding pt fatigue and weakness, due to fall risk. Pt recent EF came back at 20-25%. Recent iron 25 and ferritin 73. Dr. [name] okay to treat with one time IV iron infusion. Scheduling message sent, and pt daughter made aware to expect call from scheduling.

## 2017-06-05 ENCOUNTER — Telehealth: Payer: Self-pay

## 2017-06-05 NOTE — Telephone Encounter (Signed)
Spoke with daughter and scheduled appointment for fluids at [facility]. Per 5/17 in basket msg.

## 2017-06-06 ENCOUNTER — Ambulatory Visit (HOSPITAL_COMMUNITY)
Admission: RE | Admit: 2017-06-06 | Discharge: 2017-06-06 | Disposition: A | Discharge status: Home / Self Care | Payer: Medicare Other | Source: Ambulatory Visit | Attending: Hematology | Admitting: Hematology

## 2017-06-06 DIAGNOSIS — D509 Iron deficiency anemia, unspecified: Secondary | ICD-10-CM | POA: Insufficient documentation

## 2017-06-06 MED ORDER — FERRIC CARBOXYMALTOSE 750 MG/15ML IV SOLN
750.0000 mg | Freq: Once | INTRAVENOUS | Status: AC
  Administered 2017-06-06: 750 mg via INTRAVENOUS
  Filled 2017-06-06: qty 15

## 2017-06-06 MED ORDER — SODIUM CHLORIDE 0.9 % IV SOLN
Freq: Once | INTRAVENOUS | Status: AC
  Administered 2017-06-06: 10 mL/h via INTRAVENOUS

## 2017-06-06 NOTE — Progress Notes (Signed)
Patient received Injectifar via PIV. IV taken out after infusion, and patient waited for a minimum of 30 minutes post transfusion. Tolerated well, vitals stable, discharge instructions given, verbalized understanding. Patient alert, oriented and ambulatory at the time of discharge.

## 2017-06-06 NOTE — Discharge Instructions (Signed)
Ferric carboxymaltose injection What is this medicine? FERRIC CARBOXYMALTOSE (ferr-ik car-box-ee-mol-toes) is an iron complex. Iron is used to make healthy red blood cells, which carry oxygen and nutrients throughout the body. This medicine is used to treat anemia in people with chronic kidney disease or people who cannot take iron by mouth. This medicine may be used for other purposes; ask your health care provider or pharmacist if you have questions. COMMON BRAND NAME(S): Injectafer What should I tell my health care provider before I take this medicine? They need to know if you have any of these conditions: -anemia not caused by low iron levels -high levels of iron in the blood -liver disease -an unusual or allergic reaction to iron, other medicines, foods, dyes, or preservatives -pregnant or trying to get pregnant -breast-feeding How should I use this medicine? This medicine is for infusion into a vein. It is given by a health care professional in a hospital or clinic setting. Talk to your pediatrician regarding the use of this medicine in children. Special care may be needed. Overdosage: If you think you have taken too much of this medicine contact a poison control center or emergency room at once. NOTE: This medicine is only for you. Do not share this medicine with others. What if I miss a dose? It is important not to miss your dose. Call your doctor or health care professional if you are unable to keep an appointment. What may interact with this medicine? Do not take this medicine with any of the following medications: -deferoxamine -dimercaprol -other iron products This medicine may also interact with the following medications: -chloramphenicol -deferasirox This list may not describe all possible interactions. Give your health care provider a list of all the medicines, herbs, non-prescription drugs, or dietary supplements you use. Also tell them if you smoke, drink alcohol, or use  illegal drugs. Some items may interact with your medicine. What should I watch for while using this medicine? Visit your doctor or health care professional regularly. Tell your doctor if your symptoms do not start to get better or if they get worse. You may need blood work done while you are taking this medicine. You may need to follow a special diet. Talk to your doctor. Foods that contain iron include: whole grains/cereals, dried fruits, beans, or peas, leafy green vegetables, and organ meats (liver, kidney). What side effects may I notice from receiving this medicine? Side effects that you should report to your doctor or health care professional as soon as possible: -allergic reactions like skin rash, itching or hives, swelling of the face, lips, or tongue -breathing problems -changes in blood pressure -feeling faint or lightheaded, falls -flushing, sweating, or hot feelings Side effects that usually do not require medical attention (report to your doctor or health care professional if they continue or are bothersome): -changes in taste -constipation -dizziness -headache -nausea -pain, redness, or irritation at site where injected -vomiting This list may not describe all possible side effects. Call your doctor for medical advice about side effects. You may report side effects to FDA at 1-800-FDA-1088. Where should I keep my medicine? This drug is given in a hospital or clinic and will not be stored at home. NOTE: This sheet is a summary. It may not cover all possible information. If you have questions about this medicine, talk to your doctor, pharmacist, or health care provider.  2018 Elsevier/Gold Standard (2015-02-05 11:20:47)  

## 2017-06-13 NOTE — Progress Notes (Signed)
Cardiology Office Note   Date:  06/14/2017   ID:  Kathleen Arias, DOB November 13, 1925, MRN [id number]  PCP:  [name], MD  Cardiologist:  Dr. Johnsie Cancel    No chief complaint on file.     History of Present Illness:  84 y.o. f/u for CHF, HTN, LBBB , Hypothyroidism and arthritis. Initially seen in hospital 12/19/1 TTE with EF 40-45% moderate MR Cath 01/07/16 with no significant obstructive CAD.   F/U echo 11/01/16 EF 25-30% didn't like lasix and HCTZ not strong Enough. BNP over 11,000 November 2018 started on Demadex  Still with some persistent LE edema  TTE reviewed from 05/31/17 and EF still 20-25% mild MR/AR estimated PA pressure 61 mmHg Labs 05/31/17 BNP 11,281 K 4.2 Cr .95 BUN 18 CXR done for dyspnea 05/19/17 NAD   Some chronic abdominal pain CT 05/19/17 chronic porcelain gallbladder  Weakness and anemia Hct 27.5 -> 31.1 with one Rx iv iron   She actually feels well Not volume overloaded compliant with meds Sleeping through the night With no PND/Orthopnea      Past Medical History:  Diagnosis Date  . Anemia   . Anginal pain ([place])   . Arthritis   . Cancer ([place])   . CHF (congestive heart failure) ([place])   . Dyspnea   . Dysrhythmia   . Hypertension   . Hypothyroidism   . Thyroid disease     Past Surgical History:  Procedure Laterality Date  . CARDIAC CATHETERIZATION N/A 01/07/2016   Procedure: Left Heart Cath and Coronary Angiography;  Surgeon: Jettie Booze, MD;  Location: [facility];  Service: Cardiovascular;  Laterality: N/A;  . CATARACT EXTRACTION, BILATERAL    . FRACTURE SURGERY    . HIP SURGERY    . JOINT REPLACEMENT    . TONSILLECTOMY       Current Outpatient Medications  Medication Sig Dispense Refill  . B COMPLEX-C-CALCIUM PO Take 1 tablet by mouth daily.    . carvedilol (COREG) 6.25 MG tablet Take 6.25 mg by mouth 2 (two) times daily with a meal.     . iron polysaccharides (NIFEREX) 150 MG capsule Take 1 capsule (150 mg total) by mouth  daily. 100 capsule 1  . levothyroxine (SYNTHROID, LEVOTHROID) 125 MCG tablet Take 1 tablet (125 mcg total) by mouth daily before breakfast. 30 tablet 0  . losartan (COZAAR) 25 MG tablet Take 1 tablet (25 mg total) by mouth daily. 90 tablet 1  . OVER THE COUNTER MEDICATION Take 2 tablets by mouth daily. VITAFUSION gummies    . potassium chloride SA (K-DUR,KLOR-CON) 20 MEQ tablet Take 1 tablet (20 mEq total) by mouth daily. 90 tablet 3  . torsemide (DEMADEX) 20 MG tablet Take 0.5 tablets (10 mg total) by mouth daily. 90 tablet 3  . Vitamin E 400 units TABS Take 800 Units by mouth daily.      No current facility-administered medications for this visit.     Allergies:   Patient has no known allergies.    Social History:  The patient  reports that she has quit smoking. She has never used smokeless tobacco. She reports that she does not drink alcohol or use drugs.   Family History:  The patient's family history includes Heart disease in her mother.    ROS:  General:no colds or fevers, no weight changes Skin:no rashes or ulcers HEENT:no blurred vision, no congestion CV:see HPI PUL:see HPI GI:no diarrhea constipation or melena, no indigestion GU:no hematuria, no dysuria MS:no  joint pain, no claudication Neuro:no syncope, no lightheadedness Endo:no diabetes, + thyroid disease  Wt Readings from Last 3 Encounters:  06/14/17 126 lb 8 oz (57.4 kg)  05/30/17 129 lb 8 oz (58.7 kg)  05/19/17 123 lb 12.8 oz (56.2 kg)     PHYSICAL EXAM: VS:  BP (!) 122/58   Pulse 77   Ht 5\' 3"  (1.6 m)   Wt 126 lb 8 oz (57.4 kg)   SpO2 96%   BMI 22.41 kg/m  , BMI Body mass index is 22.41 kg/m. Affect appropriate Elderly female  HEENT: normal Neck supple with no adenopathy JVP normal no bruits no thyromegaly Lungs clear with no wheezing and good diaphragmatic motion Heart:  S1/S2 no murmur, no rub, gallop or click PMI normal Abdomen: benighn, BS positve, no tenderness, no AAA no bruit.  No HSM or  HJR Distal pulses intact with no bruits Plus one bilateral  edema Neuro non-focal Skin warm and dry No muscular weakness   EKG: 05/19/17  SR with PVCs LBBB  Recent Labs: 05/19/2017: B Natriuretic Peptide 2,200.8 05/20/2017: TSH 20.927 05/30/2017: ALT 14; Hemoglobin 9.3; Platelet Count 198 05/31/2017: BUN 18; Creatinine, Ser 0.95; NT-Pro BNP 11,281; Potassium 4.2; Sodium 139    Lipid Panel No results found for: CHOL, TRIG, HDL, CHOLHDL, VLDL, LDLCALC, LDLDIRECT     Other studies Reviewed: Additional studies/ records that were reviewed today include: . Echo 11/01/16 Study Conclusions  - Left ventricle: The cavity size was normal. Wall thickness was   normal. Systolic function was severely reduced. The estimated   ejection fraction was in the range of 25% to 30%. Features are   consistent with a pseudonormal left ventricular filling pattern,   with concomitant abnormal relaxation and increased filling   pressure (grade 2 diastolic dysfunction). - Aortic valve: Mildly calcified annulus. Normal thickness   leaflets. There was mild regurgitation. - Mitral valve: There was mild regurgitation. - Left atrium: The atrium was moderately dilated. - Pulmonary arteries: Systolic pressure was moderately increased.   PA peak pressure: 58 mm Hg (S).  Cath 01/07/16 Procedures   Left Heart Cath and Coronary Angiography  Conclusion     There is moderate to severe left ventricular systolic dysfunction.  The left ventricular ejection fraction is 25-35% by visual estimate.  LV end diastolic pressure is mildly elevated.  Mid RCA lesion, 25 %stenosed.  Ost 2nd Diag to 2nd Diag lesion, 25 %stenosed.  Mid LAD lesion, 40 %stenosed.  Mid Cx to Dist Cx lesion, 25 %stenosed.   Nonischemic cardiomyopathy.  Continue medical therapy.     ASSESSMENT AND PLAN:  1. Systolic CHF:  Rx limited by age and BP No CAD by cath 01/07/16  EF 20-25% TTE 05/31/17 with Persistently elevated BNP over  11,000  But symptomatically ok She knows to take demedex as needed For weight gain or dyspnea in addition to her regular dose  2. Thyroid:  On replacement TSH elevate 21 05/20/17 should have dose increased by primary  3. GB:  Chronic abdomina pain with porcelain GB stable but high risk for acute cholecystitis  4. Anemia:  ? Chronic disease iv iron Rx x 1 06/06/17 on oral iron now   [employer]

## 2017-06-14 ENCOUNTER — Encounter: Payer: Self-pay | Admitting: Cardiovascular Disease

## 2017-06-14 ENCOUNTER — Ambulatory Visit: Payer: Medicare Other | Admitting: Cardiovascular Disease

## 2017-06-14 VITALS — BP 122/58 | HR 77 | Ht 63.0 in | Wt 126.5 lb

## 2017-06-14 DIAGNOSIS — I5042 Chronic combined systolic (congestive) and diastolic (congestive) heart failure: Secondary | ICD-10-CM

## 2017-06-14 DIAGNOSIS — I10 Essential (primary) hypertension: Secondary | ICD-10-CM

## 2017-06-14 NOTE — Patient Instructions (Addendum)
Medication Instructions:  Your physician recommends that you continue on your current medications as directed. Please refer to the Current Medication list given to you today.  Labwork: NONE  Testing/Procedures: NONE  Follow-Up: Your physician wants you to follow-up in: 3 months with Dr. Nishan.   If you need a refill on your cardiac medications before your next appointment, please call your pharmacy.    

## 2017-06-20 ENCOUNTER — Other Ambulatory Visit: Payer: Medicare Other

## 2017-06-20 ENCOUNTER — Ambulatory Visit: Payer: Medicare Other | Admitting: Hematology

## 2017-07-31 ENCOUNTER — Telehealth: Payer: Self-pay | Admitting: Hematology

## 2017-07-31 ENCOUNTER — Inpatient Hospital Stay: Payer: Medicare Other | Admitting: Hematology

## 2017-07-31 ENCOUNTER — Encounter: Payer: Self-pay | Admitting: Hematology

## 2017-07-31 ENCOUNTER — Inpatient Hospital Stay: Payer: Medicare Other | Attending: Hematology

## 2017-07-31 VITALS — BP 147/71 | HR 74 | Temp 98.1°F | Resp 18 | Ht 63.0 in | Wt 127.4 lb

## 2017-07-31 DIAGNOSIS — C8307 Small cell B-cell lymphoma, spleen: Secondary | ICD-10-CM | POA: Diagnosis not present

## 2017-07-31 DIAGNOSIS — D649 Anemia, unspecified: Secondary | ICD-10-CM

## 2017-07-31 DIAGNOSIS — D509 Iron deficiency anemia, unspecified: Secondary | ICD-10-CM | POA: Diagnosis not present

## 2017-07-31 DIAGNOSIS — D696 Thrombocytopenia, unspecified: Secondary | ICD-10-CM

## 2017-07-31 LAB — CBC WITH DIFFERENTIAL/PLATELET
BASOS PCT: 0 %
Basophils Absolute: 0 10*3/uL (ref 0.0–0.1)
EOS ABS: 0 10*3/uL (ref 0.0–0.5)
Eosinophils Relative: 0 %
HEMATOCRIT: 31.9 % — AB (ref 34.8–46.6)
Hemoglobin: 9.9 g/dL — ABNORMAL LOW (ref 11.6–15.9)
LYMPHS ABS: 3.9 10*3/uL — AB (ref 0.9–3.3)
Lymphocytes Relative: 52 %
MCH: 24.2 pg — AB (ref 25.1–34.0)
MCHC: 31 g/dL — AB (ref 31.5–36.0)
MCV: 78 fL — ABNORMAL LOW (ref 79.5–101.0)
MONO ABS: 1.2 10*3/uL — AB (ref 0.1–0.9)
MONOS PCT: 15 %
NEUTROS ABS: 2.6 10*3/uL (ref 1.5–6.5)
Neutrophils Relative %: 33 %
Platelets: 129 10*3/uL — ABNORMAL LOW (ref 145–400)
RBC: 4.09 MIL/uL (ref 3.70–5.45)
RDW: 18.2 % — AB (ref 11.2–14.5)
WBC: 7.7 10*3/uL (ref 3.9–10.3)

## 2017-07-31 LAB — RETICULOCYTES
RBC.: 4.09 MIL/uL (ref 3.70–5.45)
Retic Count, Absolute: 73.6 10*3/uL (ref 33.7–90.7)
Retic Ct Pct: 1.8 % (ref 0.7–2.1)

## 2017-07-31 LAB — CMP (CANCER CENTER ONLY)
ALBUMIN: 3.9 g/dL (ref 3.5–5.0)
ALK PHOS: 106 U/L (ref 38–126)
ALT: 9 U/L (ref 0–44)
AST: 26 U/L (ref 15–41)
Anion gap: 8 (ref 5–15)
BUN: 16 mg/dL (ref 8–23)
CALCIUM: 9.1 mg/dL (ref 8.9–10.3)
CO2: 31 mmol/L (ref 22–32)
CREATININE: 1 mg/dL (ref 0.44–1.00)
Chloride: 97 mmol/L — ABNORMAL LOW (ref 98–111)
GFR, EST AFRICAN AMERICAN: 56 mL/min — AB (ref >60)
GFR, Estimated: 48 mL/min — ABNORMAL LOW (ref >60)
GLUCOSE: 84 mg/dL (ref 70–99)
Potassium: 4.1 mmol/L (ref 3.5–5.1)
SODIUM: 136 mmol/L (ref 135–145)
Total Bilirubin: 0.8 mg/dL (ref 0.3–1.2)
Total Protein: 8.3 g/dL — ABNORMAL HIGH (ref 6.5–8.1)

## 2017-07-31 LAB — IRON AND TIBC
Iron: 34 ug/dL — ABNORMAL LOW (ref 41–142)
SATURATION RATIOS: 11 % — AB (ref 21–57)
TIBC: 300 ug/dL (ref 236–444)
UIBC: 266 ug/dL

## 2017-07-31 LAB — FERRITIN: Ferritin: 138 ng/mL (ref 11–307)

## 2017-07-31 LAB — LACTATE DEHYDROGENASE: LDH: 335 U/L — ABNORMAL HIGH (ref 98–192)

## 2017-07-31 NOTE — Progress Notes (Signed)
[facility]    HEMATOLOGY/ONCOLOGY CLINIC NOTE  Date of Service: 07/31/2017   Patient Care Team: [name], MD as PCP - General (Family Medicine) [name], MD as PCP - Cardiology (Cardiology)  CHIEF COMPLAINTS/PURPOSE OF CONSULTATION:  F/u for splenic marginal zone lymphoma  HISTORY OF PRESENTING ILLNESS:   Kathleen Arias is a wonderful 84 y.o. female who has been referred to [place] by Dr .Redmond Pulling, Jama Flavors, MD for evaluation and management of anemia, leukocytosis and thrombocytopenia.  Patient is a very pleasant female who is here with her daughter. She has a history of hypertension, chronic systolic CHF [ejection fraction of 25-35% based on recent cardiac catheterization on 01/13/2016, echo done showed ejection fraction of 40-45%, nonischemic cardiomyopathy], iron deficiency,  Patient presented to his primary care physician with some dyspnea on exertion which is primarily related to her systolic CHF but she was also noted to have recent blood counts with her primary care physician on 03/15/2016 which showed anemia with a hemoglobin of 9.7 and a hematocrit of 79, platelet count of 137k , WBC count of 11.7k with 7.6k lymphocytes.  Iron studies were done which showed a ferritin of 84 and an iron saturation of 9%. She has been on oral iron replacement by her primary care physician and notes significant constipation and GI intolerance. She reports that she had a FOBT in 12/2015  which was negative.   Patient notes significant fatigue and dyspnea on exertion. Notes weight loss of about 15 pounds since September 2017. Weight is down from 142 to 124lbs.   Patient denies any enlarged lymph nodes, no night sweats.  INTERVAL HISTORY   Kathleen Arias is here for follow-up for management of splenic marginal zone lymphoma. The patient's last visit with [place] was on 05/30/17. She is accompanied today by her daughter. The pt reports that she is doing well overall.   The pt reports that she has continued to  feel better since our last visit. She notes that her legs have had more leg swelling recently, and is taking a water pill. She notes that her appetite comes and goes, but recently she has been eating very well.   She notes that she tolerated an IV Iron infusion well in the interim. Her dizziness has resolved in the interim as well. She notes that she uses a walker to ambulate successfully without any recent falls.   She also notes that she has noticed an inguinal enlargement that is about as big as a small pea.   Lab results today (07/31/17) of CBC w/diff, CMP, and Reticulocytes is as follows: all values are WNL except for HGB at 9.9, HCT at 31.9, MCV at 78.0, MCH at 24.2, MCHC at 31.0, RDW at 18.2, PLT at 129k, Lymphs abs at 3.9k, Monocytes abs at 1.2k, Chloride at 97, Total Protein at 8.3, GFR at 48. LDH 07/31/17 is 335 Ferritin 07/31/17 is 138  On review of systems, pt reports increased leg swelling, eating well, regained strength, satisfactory appetite, improved energy levels, tender left abdomen, and denies fevers, chills, night sweats, dizziness, falls, and any other symptoms.   MEDICAL HISTORY:  Past Medical History:  Diagnosis Date  . Anemia   . Anginal pain ([place])   . Arthritis   . Cancer ([place])   . CHF (congestive heart failure) ([place])   . Dyspnea   . Dysrhythmia   . Hypertension   . Hypothyroidism   . Thyroid disease   Chronic systolic CHF Iron deficiency   SURGICAL  HISTORY: Past Surgical History:  Procedure Laterality Date  . CARDIAC CATHETERIZATION N/A 01/07/2016   Procedure: Left Heart Cath and Coronary Angiography;  Surgeon: Jettie Booze, MD;  Location: [facility];  Service: Cardiovascular;  Laterality: N/A;  . CATARACT EXTRACTION, BILATERAL    . FRACTURE SURGERY    . HIP SURGERY    . JOINT REPLACEMENT    . TONSILLECTOMY      SOCIAL HISTORY: Social History   Socioeconomic History  . Marital status: Single    Spouse name: Not on file  . Number of  children: Not on file  . Years of education: Not on file  . Highest education level: Not on file  Occupational History  . Not on file  Social Needs  . Financial resource strain: Not on file  . Food insecurity:    Worry: Not on file    Inability: Not on file  . Transportation needs:    Medical: Not on file    Non-medical: Not on file  Tobacco Use  . Smoking status: Former Research scientist (life sciences)  . Smokeless tobacco: Never Used  Substance and Sexual Activity  . Alcohol use: No  . Drug use: No  . Sexual activity: Not on file  Lifestyle  . Physical activity:    Days per week: Not on file    Minutes per session: Not on file  . Stress: Not on file  Relationships  . Social connections:    Talks on phone: Not on file    Gets together: Not on file    Attends religious service: Not on file    Active member of club or organization: Not on file    Attends meetings of clubs or organizations: Not on file    Relationship status: Not on file  . Intimate partner violence:    Fear of current or ex partner: Not on file    Emotionally abused: Not on file    Physically abused: Not on file    Forced sexual activity: Not on file  Other Topics Concern  . Not on file  Social History Narrative  . Not on file    FAMILY HISTORY: Family History  Problem Relation Age of Onset  . Heart disease Mother     ALLERGIES:  has No Known Allergies.  MEDICATIONS:  Current Outpatient Medications  Medication Sig Dispense Refill  . B COMPLEX-C-CALCIUM PO Take 1 tablet by mouth daily.    . carvedilol (COREG) 6.25 MG tablet Take 6.25 mg by mouth 2 (two) times daily with a meal.     . iron polysaccharides (NIFEREX) 150 MG capsule Take 1 capsule (150 mg total) by mouth daily. 100 capsule 1  . levothyroxine (SYNTHROID, LEVOTHROID) 125 MCG tablet Take 1 tablet (125 mcg total) by mouth daily before breakfast. 30 tablet 0  . losartan (COZAAR) 25 MG tablet Take 1 tablet (25 mg total) by mouth daily. 90 tablet 1  . OVER THE  COUNTER MEDICATION Take 2 tablets by mouth daily. VITAFUSION gummies    . potassium chloride SA (K-DUR,KLOR-CON) 20 MEQ tablet Take 1 tablet (20 mEq total) by mouth daily. 90 tablet 3  . torsemide (DEMADEX) 20 MG tablet Take 0.5 tablets (10 mg total) by mouth daily. 90 tablet 3  . Vitamin E 400 units TABS Take 800 Units by mouth daily.      No current facility-administered medications for this visit.     REVIEW OF SYSTEMS:    A 10+ POINT REVIEW OF SYSTEMS WAS  OBTAINED including neurology, dermatology, psychiatry, cardiac, respiratory, lymph, extremities, GI, GU, Musculoskeletal, constitutional, breasts, reproductive, HEENT.  All pertinent positives are noted in the HPI.  All others are negative.   PHYSICAL EXAMINATION:  ECOG PERFORMANCE STATUS: 2 - Symptomatic, <50% confined to bed  Vitals:   07/31/17 1502  BP: (!) 147/71  Pulse: 74  Resp: 18  Temp: 98.1 F (36.7 C)  TempSrc: Oral  SpO2: 93%  Weight: 127 lb 6.4 oz (57.8 kg)  Height: 5\' 3"  (1.6 m)   Body mass index is 22.57 kg/m.  GENERAL:alert, in no acute distress and comfortable SKIN: no acute rashes, no significant lesions EYES: conjunctiva are pink and non-injected, sclera anicteric OROPHARYNX: MMM, no exudates, no oropharyngeal erythema or ulceration NECK: supple, no JVD LYMPH:  no palpable lymphadenopathy in the cervical, axillary or inguinal regions LUNGS: clear to auscultation b/l with normal respiratory effort HEART: regular rate & rhythm ABDOMEN:  normoactive bowel sounds , not distended, possible just palpable splenomegaly but difficulty to tell with guarding Extremity: 2+ pedal edema PSYCH: alert & oriented x 3 with fluent speech NEURO: no focal motor/sensory deficits   LABORATORY DATA:  I have reviewed the data as listed  . CBC Latest Ref Rng & Units 07/31/2017 05/30/2017 05/20/2017  WBC 3.9 - 10.3 K/uL 7.7 4.9 3.4(L)  Hemoglobin 11.6 - 15.9 g/dL 9.9(L) 9.3(L) 8.4(L)  Hematocrit 34.8 - 46.6 % 31.9(L) 31.1(L)  27.5(L)  Platelets 145 - 400 K/uL 129(L) 198 166    . CBC    Component Value Date/Time   WBC 7.7 07/31/2017 1434   RBC 4.09 07/31/2017 1434   RBC 4.09 07/31/2017 1434   HGB 9.9 (L) 07/31/2017 1434   HGB 9.3 (L) 05/30/2017 0843   HGB 11.4 (L) 09/27/2016 1251   HCT 31.9 (L) 07/31/2017 1434   HCT 32.7 (L) 09/27/2016 1251   PLT 129 (L) 07/31/2017 1434   PLT 198 05/30/2017 0843   PLT 223 09/27/2016 1251   PLT 118 (L) 05/04/2016 1435   MCV 78.0 (L) 07/31/2017 1434   MCV 84.9 09/27/2016 1251   MCH 24.2 (L) 07/31/2017 1434   MCHC 31.0 (L) 07/31/2017 1434   RDW 18.2 (H) 07/31/2017 1434   RDW 16.7 (H) 09/27/2016 1251   LYMPHSABS 3.9 (H) 07/31/2017 1434   LYMPHSABS 1.1 09/27/2016 1251   MONOABS 1.2 (H) 07/31/2017 1434   MONOABS 0.5 09/27/2016 1251   EOSABS 0.0 07/31/2017 1434   EOSABS 0.0 09/27/2016 1251   EOSABS 0.1 05/04/2016 1435   BASOSABS 0.0 07/31/2017 1434   BASOSABS 0.0 09/27/2016 1251    . CMP Latest Ref Rng & Units 07/31/2017 05/31/2017 05/30/2017  Glucose 70 - 99 mg/dL 84 87 110  BUN 8 - 23 mg/dL 16 18 19   Creatinine 0.44 - 1.00 mg/dL 1.00 0.95 1.00  Sodium 135 - 145 mmol/L 136 139 138  Potassium 3.5 - 5.1 mmol/L 4.1 4.2 3.9  Chloride 98 - 111 mmol/L 97(L) 99 101  CO2 22 - 32 mmol/L 31 27 28   Calcium 8.9 - 10.3 mg/dL 9.1 8.8 9.2  Total Protein 6.5 - 8.1 g/dL 8.3(H) - 7.9  Total Bilirubin 0.3 - 1.2 mg/dL 0.8 - 0.7  Alkaline Phos 38 - 126 U/L 106 - 84  AST 15 - 41 U/L 26 - 25  ALT 0 - 44 U/L 9 - 14    Lab Results  Component Value Date   FERRITIN 138 07/31/2017   . Lab Results  Component Value Date   LDH 335 (  H) 07/31/2017            RADIOGRAPHIC STUDIES: I have personally reviewed the radiological images as listed and agreed with the findings in the report. No results found.  ASSESSMENT & PLAN:   84 y.o. with   1) Splenic marginal zone lymphoma On presentation patient had anemia, lymphocytosis, thrombocytopenia and significant constitutional  symptoms including fatigue, some low-grade fevers and night sweats. Also had abdominal discomfort related to her splenomegaly and anorexia with spleen of about 23cms on PET/CT. Serum kappa lambda free light chain ratio abnormally elevated. Myeloma panel no M spike IFE shows polyclonal gammopathy  Patient is status post Rituxan weekly 4 doses (06/27/16-07/22/16)  05/30/17 flow cytometry shows persistent SMZL clonal lymphocytes.   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  Thrombocytopenia improved PLT and lymphocytosis has remained resolved   Decreased splenomegaly significantly as per rpt [place] abd on 09/20/16. Stable as seen on 05/19/17 CT AP.   3) hypothyroidism- on levothyroxine  4) nonischemic systolic cardiomyopathy -  Cardiac catheterization December 2017- nonobstructive coronary artery disease. - EF decreased to 25-30% on 11/01/16 ECHO  5) Rigors and fever related to Rituxan vs Tumor lysis after 1st dose. Has not had any of these issues with adjusted pre-medications with the 2nd dose of Rituxan and is overall feeling much better.  6) Weakness and fatigue - likely multifactorial - CHF, iron deficiency, age, hypothyroidism. Less likely related primarily to [place] .  PLAN:  -patient is feeling better since rx of UTI and optimizing thyroid replacement. -no acute new symptoms suggestive of SMZL progression. -I advised her to watch for concerning symptoms of fever, chills, night sweats to increased splenomegaly.  -Discussed pt labwork today, 07/31/17; anemia slightly improved to 9.9, MCV stable at 78.0, PLT decreased to 129k, Lymphs increased from 1.4k to 3.9k. Monocytes increased from 600 to 1.2k.  -Continue 150mg  Iron Polysaccharide po daily   6) Bilateral ankle swelling with left ankle induration -Previous Doppler negative for DVT PLAN  -I previously recommended she continues to use compression socks, epsom salt bath and continue to  RICE   RTC with Dr [name] in 3 months with labs    All of the patients and her accompanying family's questions were answered with apparent satisfaction. The patient knows to call the clinic with any problems, questions or concerns.  The total time spent in the appt was 20 minutes and more than 50% was on counseling and direct patient cares.      Sullivan Lone MD [place] AAHIVMS [facility] Hematology/Oncology Physician [facility]  (Office):       [phone] (Work cell):  [phone] (Fax):           [phone]  I, Baldwin [place], am acting as a scribe for Dr [name].   .I have reviewed the above documentation for accuracy and completeness, and I agree with the above. Brunetta Genera MD

## 2017-07-31 NOTE — Telephone Encounter (Signed)
Gave patient/relative avs report and appointments for October  °

## 2017-09-28 ENCOUNTER — Encounter: Payer: Self-pay | Admitting: Cardiovascular Disease

## 2017-10-03 NOTE — Progress Notes (Signed)
Cardiology Office Note   Date:  10/11/2017   ID:  Kathleen Arias, DOB 08/11/1925, MRN [id number]  PCP:  [name], MD  Cardiologist:  Dr. Johnsie Cancel    No chief complaint on file.     History of Present Illness:  84 y.o. f/u for CHF, HTN, LBBB , Hypothyroidism , Anemia and GB disease . Initially seen in hospital 01/05/16 TTE with EF 40-45% moderate MR Cath 01/07/16 with no significant obstructive CAD. Most recent echo done 5/15/9 EF 20-25% with mild AR/MR estimated PA pressure 61 mmHg. 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 Fortunately has not had any cholecystitis    She actually feels well Not volume overloaded compliant with meds Sleeping through the night With no PND/Orthopnea   Feels tired and "anemic" wants blood work  Macular degeneration worse especially after iv iron infusion      Past Medical History:  Diagnosis Date  . Anemia   . Anginal pain ([place])   . Arthritis   . Cancer ([place])   . CHF (congestive heart failure) ([place])   . Dyspnea   . Dysrhythmia   . Hypertension   . Hypothyroidism   . Thyroid disease     Past Surgical History:  Procedure Laterality Date  . CARDIAC CATHETERIZATION N/A 01/07/2016   Procedure: Left Heart Cath and Coronary Angiography;  Surgeon: Jettie Booze, MD;  Location: [facility];  Service: Cardiovascular;  Laterality: N/A;  . CATARACT EXTRACTION, BILATERAL    . FRACTURE SURGERY    . HIP SURGERY    . JOINT REPLACEMENT    . TONSILLECTOMY       Current Outpatient Medications  Medication Sig Dispense Refill  . B COMPLEX-C-CALCIUM PO Take 1 tablet by mouth daily.    . carvedilol (COREG) 6.25 MG tablet Take 6.25 mg by mouth 2 (two) times daily with a meal.     . iron polysaccharides (NIFEREX) 150 MG capsule Take 1 capsule (150 mg total) by mouth daily. 100 capsule 1  . levothyroxine (SYNTHROID, LEVOTHROID) 125 MCG tablet Take 1 tablet (125 mcg total) by mouth daily before breakfast. 30 tablet 0  .  losartan (COZAAR) 25 MG tablet Take 1 tablet (25 mg total) by mouth daily. 90 tablet 1  . OVER THE COUNTER MEDICATION Take 2 tablets by mouth daily. VITAFUSION gummies    . potassium chloride SA (K-DUR,KLOR-CON) 20 MEQ tablet Take 1 tablet (20 mEq total) by mouth daily. 90 tablet 3  . torsemide (DEMADEX) 20 MG tablet Take 0.5 tablets (10 mg total) by mouth daily. 90 tablet 3  . Vitamin E 400 units TABS Take 800 Units by mouth daily.      No current facility-administered medications for this visit.     Allergies:   Patient has no known allergies.    Social History:  The patient  reports that she has quit smoking. She has never used smokeless tobacco. She reports that she does not drink alcohol or use drugs.   Family History:  The patient's family history includes Heart disease in her mother.    ROS:  General:no colds or fevers, no weight changes Skin:no rashes or ulcers HEENT:no blurred vision, no congestion CV:see HPI PUL:see HPI GI:no diarrhea constipation or melena, no indigestion GU:no hematuria, no dysuria MS:no joint pain, no claudication Neuro:no syncope, no lightheadedness Endo:no diabetes, + thyroid disease  Wt Readings from Last 3 Encounters:  10/11/17 118 lb 3.2 oz (53.6 kg)  07/31/17 127 lb 6.4  oz (57.8 kg)  06/14/17 126 lb 8 oz (57.4 kg)     PHYSICAL EXAM: VS:  BP (!) 144/82   Pulse 83   Ht 5\' 3"  (1.6 m)   Wt 118 lb 3.2 oz (53.6 kg)   SpO2 97%   BMI 20.94 kg/m  , BMI Body mass index is 20.94 kg/m. Affect appropriate Healthy:  Elderly white female  HEENT: normal Neck supple with no adenopathy JVP normal no bruits no thyromegaly Lungs clear with no wheezing and good diaphragmatic motion Heart:  S1/S2 no murmur, no rub, gallop or click PMI normal Abdomen: benighn, BS positve, no tenderness, no AAA no bruit.  No HSM or HJR Distal pulses intact with no bruits Plus one bilateral edema Neuro non-focal Skin warm and dry No muscular weakness   EKG:  05/19/17  SR with PVCs LBBB  Recent Labs: 05/19/2017: B Natriuretic Peptide 2,200.8 05/20/2017: TSH 20.927 05/31/2017: NT-Pro BNP 11,281 07/31/2017: ALT 9; BUN 16; Creatinine 1.00; Hemoglobin 9.9; Platelets 129; Potassium 4.1; Sodium 136    Lipid Panel No results found for: CHOL, TRIG, HDL, CHOLHDL, VLDL, LDLCALC, LDLDIRECT     Other studies Reviewed: Additional studies/ records that were reviewed today include: . Echo 11/01/16 Study Conclusions  - Left ventricle: The cavity size was normal. Wall thickness was   normal. Systolic function was severely reduced. The estimated   ejection fraction was in the range of 25% to 30%. Features are   consistent with a pseudonormal left ventricular filling pattern,   with concomitant abnormal relaxation and increased filling   pressure (grade 2 diastolic dysfunction). - Aortic valve: Mildly calcified annulus. Normal thickness   leaflets. There was mild regurgitation. - Mitral valve: There was mild regurgitation. - Left atrium: The atrium was moderately dilated. - Pulmonary arteries: Systolic pressure was moderately increased.   PA peak pressure: 58 mm Hg (S).  Cath 01/07/16 Procedures   Left Heart Cath and Coronary Angiography  Conclusion     There is moderate to severe left ventricular systolic dysfunction.  The left ventricular ejection fraction is 25-35% by visual estimate.  LV end diastolic pressure is mildly elevated.  Mid RCA lesion, 25 %stenosed.  Ost 2nd Diag to 2nd Diag lesion, 25 %stenosed.  Mid LAD lesion, 40 %stenosed.  Mid Cx to Dist Cx lesion, 25 %stenosed.   Nonischemic cardiomyopathy.  Continue medical therapy.     ASSESSMENT AND PLAN:  1. Systolic CHF:  Rx limited by age and BP No CAD by cath 01/07/16  EF 20-25% TTE 05/31/17 with Persistently elevated BNP over 11,000  But symptomatically ok She knows to take demedex as needed For weight gain or dyspnea in addition to her regular dose  2. Thyroid:  On  replacement TSH elevate 21 05/20/17 should have dose increased by primary labs ordered today  3. GB:  Chronic abdomina pain with porcelain GB stable but high risk for acute cholecystitis  4. Anemia:  ? Chronic disease iv iron Rx x 1 06/06/17 on oral iron now Hct 31.9 07/31/17  Labs today  5. LBBB:  Chronic related to DCM no syncope or evidence of high grade heart block   Jenkins Rouge

## 2017-10-11 ENCOUNTER — Encounter: Payer: Self-pay | Admitting: Cardiovascular Disease

## 2017-10-11 ENCOUNTER — Ambulatory Visit (INDEPENDENT_AMBULATORY_CARE_PROVIDER_SITE_OTHER): Payer: Medicare Other | Admitting: Cardiovascular Disease

## 2017-10-11 VITALS — BP 144/82 | HR 83 | Ht 63.0 in | Wt 118.2 lb

## 2017-10-11 DIAGNOSIS — R609 Edema, unspecified: Secondary | ICD-10-CM

## 2017-10-11 DIAGNOSIS — Z79899 Other long term (current) drug therapy: Secondary | ICD-10-CM

## 2017-10-11 DIAGNOSIS — D649 Anemia, unspecified: Secondary | ICD-10-CM | POA: Diagnosis not present

## 2017-10-11 DIAGNOSIS — I5042 Chronic combined systolic (congestive) and diastolic (congestive) heart failure: Secondary | ICD-10-CM

## 2017-10-11 DIAGNOSIS — R0602 Shortness of breath: Secondary | ICD-10-CM | POA: Diagnosis not present

## 2017-10-11 DIAGNOSIS — I10 Essential (primary) hypertension: Secondary | ICD-10-CM

## 2017-10-11 NOTE — Patient Instructions (Addendum)
Medication Instructions:  Your physician recommends that you continue on your current medications as directed. Please refer to the Current Medication list given to you today.  Labwork: Your physician recommends that you have lab work today- BM ET, Hemoglobin, Hematocrit, TSH, T4, Iron, and Ferritin.    Testing/Procedures: NONE  Follow-Up: Your physician wants you to follow-up in: 12 months with Dr. Johnsie Cancel. You will receive a reminder letter in the mail two months in advance. If you don't receive a letter, please call our office to schedule the follow-up appointment.   If you need a refill on your cardiac medications before your next appointment, please call your pharmacy.

## 2017-10-12 ENCOUNTER — Telehealth: Payer: Self-pay | Admitting: Cardiovascular Disease

## 2017-10-12 LAB — FERRITIN: FERRITIN: 157 ng/mL — AB (ref 15–150)

## 2017-10-12 LAB — T4, FREE: FREE T4: 1.11 ng/dL (ref 0.82–1.77)

## 2017-10-12 LAB — IRON: Iron: 33 ug/dL (ref 27–139)

## 2017-10-12 LAB — BASIC METABOLIC PANEL
BUN/Creatinine Ratio: 14 (ref 12–28)
BUN: 11 mg/dL (ref 10–36)
CALCIUM: 8.4 mg/dL — AB (ref 8.7–10.3)
CHLORIDE: 97 mmol/L (ref 96–106)
CO2: 23 mmol/L (ref 20–29)
Creatinine, Ser: 0.79 mg/dL (ref 0.57–1.00)
GFR calc non Af Amer: 66 mL/min/{1.73_m2} (ref >59)
GFR, EST AFRICAN AMERICAN: 76 mL/min/{1.73_m2} (ref >59)
Glucose: 74 mg/dL (ref 65–99)
Potassium: 4.2 mmol/L (ref 3.5–5.2)
Sodium: 133 mmol/L — ABNORMAL LOW (ref 134–144)

## 2017-10-12 LAB — TSH: TSH: 31.84 u[IU]/mL — AB (ref 0.450–4.500)

## 2017-10-12 LAB — HEMATOCRIT: Hematocrit: 28.5 % — ABNORMAL LOW (ref 34.0–46.6)

## 2017-10-12 LAB — HEMOGLOBIN: Hemoglobin: 9.4 g/dL — ABNORMAL LOW (ref 11.1–15.9)

## 2017-10-12 NOTE — Telephone Encounter (Signed)
New message  Patient's daughter is returning your call for lab results.

## 2017-10-12 NOTE — Telephone Encounter (Signed)
Patients daughter aware of lab results  °

## 2017-10-27 ENCOUNTER — Telehealth: Payer: Self-pay | Admitting: Hematology

## 2017-10-27 NOTE — Telephone Encounter (Signed)
appt for 10/14 cancelled/ will patient back on Monday 10/14 to r/s per 10/11 vm

## 2017-10-30 ENCOUNTER — Inpatient Hospital Stay: Payer: Medicare Other | Admitting: Hematology

## 2017-10-30 ENCOUNTER — Inpatient Hospital Stay: Payer: Medicare Other

## 2017-11-15 ENCOUNTER — Telehealth: Payer: Self-pay | Admitting: Hematology

## 2017-11-15 NOTE — Telephone Encounter (Signed)
Pam called to reschedule

## 2017-11-24 ENCOUNTER — Telehealth: Payer: Self-pay | Admitting: *Deleted

## 2017-11-24 NOTE — Telephone Encounter (Signed)
Daughter Volney Presser called. Mother has felt weak over past few days. States last seen by Dr. [name] in July, has appt 11/27, since they cancelled October appt due to mother feeling bad.  Daughter wants to know if Dr. [name] would order lab tests to look at for her mother, but that she did not need to be seen.  Per daughter, patient lives alone, provides own self care, ambulates with walker. Advised her that MD out of office at this time. Advised her to contact PCP for evaluation and/or take patient to ED if symptoms worsen. States she will call PCP first to see if mother can be seen today or in the AM. States she does not think that patient needs to go to ED at this time. Verbalized understanding.

## 2017-12-12 NOTE — Progress Notes (Signed)
[facility]    HEMATOLOGY/ONCOLOGY CLINIC NOTE  Date of Service: 12/13/2017   Patient Care Team: [name], MD as PCP - General (Family Medicine) [name], MD as PCP - Cardiology (Cardiology)  CHIEF COMPLAINTS/PURPOSE OF CONSULTATION:  F/u for splenic marginal zone lymphoma  HISTORY OF PRESENTING ILLNESS:   Kathleen Arias is a wonderful 84 y.o. female who has been referred to [place] by Dr .Redmond Pulling, Jama Flavors, MD for evaluation and management of anemia, leukocytosis and thrombocytopenia.  Patient is a very pleasant female who is here with her daughter. She has a history of hypertension, chronic systolic CHF [ejection fraction of 25-35% based on recent cardiac catheterization on 01/13/2016, echo done showed ejection fraction of 40-45%, nonischemic cardiomyopathy], iron deficiency,  Patient presented to his primary care physician with some dyspnea on exertion which is primarily related to her systolic CHF but she was also noted to have recent blood counts with her primary care physician on 03/15/2016 which showed anemia with a hemoglobin of 9.7 and a hematocrit of 79, platelet count of 137k , WBC count of 11.7k with 7.6k lymphocytes.  Iron studies were done which showed a ferritin of 84 and an iron saturation of 9%. She has been on oral iron replacement by her primary care physician and notes significant constipation and GI intolerance. She reports that she had a FOBT in 12/2015  which was negative.   Patient notes significant fatigue and dyspnea on exertion. Notes weight loss of about 15 pounds since September 2017. Weight is down from 142 to 124lbs.   Patient denies any enlarged lymph nodes, no night sweats.  INTERVAL HISTORY   Mrs. Salinas is here for follow-up for management of splenic marginal zone lymphoma. The patient's last visit with [place] was on 07/31/17. She is accompanied today by her daughters.   The pt reports that she has recently been flush, and has felt more fatigued and weak. She  notes that she has had a worse appetite as well. She has been ambulating with a walker more than normal.   She also notes that her left abdomen has been intermittently sore and believes this is her spleen. She denies any fevers or night sweats.   Lab results today (12/13/17) of CBC w/diff and CMP is as follows: all values are WNL except for WBC at 26.3k, RBC at 3.12, HGB at 9.2, HCT at 28.7, RDW at 18.3, PLT at 76k, Calcium at 8.3, Total Protein at 8.3, Albumin at 2.9. 12/13/17 Ferritin is pending 12/13/17 LDH is elevated at 556  On review of systems, pt reports recent fatigue, feeling flushed, loss of appetite, intermittent left abdominal soreness, and denies recent infections, cough, cold, sore throat, discomfort passing urine, fevers, night sweats, noticing any new lumps or bumps, new back pains, leg swelling, and any other symptoms.    MEDICAL HISTORY:  Past Medical History:  Diagnosis Date  . Anemia   . Anginal pain ([place])   . Arthritis   . Cancer ([place])   . CHF (congestive heart failure) ([place])   . Dyspnea   . Dysrhythmia   . Hypertension   . Hypothyroidism   . Thyroid disease   Chronic systolic CHF Iron deficiency   SURGICAL HISTORY: Past Surgical History:  Procedure Laterality Date  . CARDIAC CATHETERIZATION N/A 01/07/2016   Procedure: Left Heart Cath and Coronary Angiography;  Surgeon: Jettie Booze, MD;  Location: [facility];  Service: Cardiovascular;  Laterality: N/A;  . CATARACT EXTRACTION, BILATERAL    .  FRACTURE SURGERY    . HIP SURGERY    . JOINT REPLACEMENT    . TONSILLECTOMY      SOCIAL HISTORY: Social History   Socioeconomic History  . Marital status: Single    Spouse name: Not on file  . Number of children: Not on file  . Years of education: Not on file  . Highest education level: Not on file  Occupational History  . Not on file  Social Needs  . Financial resource strain: Not on file  . Food insecurity:    Worry: Not on file     Inability: Not on file  . Transportation needs:    Medical: Not on file    Non-medical: Not on file  Tobacco Use  . Smoking status: Former Research scientist (life sciences)  . Smokeless tobacco: Never Used  Substance and Sexual Activity  . Alcohol use: No  . Drug use: No  . Sexual activity: Not on file  Lifestyle  . Physical activity:    Days per week: Not on file    Minutes per session: Not on file  . Stress: Not on file  Relationships  . Social connections:    Talks on phone: Not on file    Gets together: Not on file    Attends religious service: Not on file    Active member of club or organization: Not on file    Attends meetings of clubs or organizations: Not on file    Relationship status: Not on file  . Intimate partner violence:    Fear of current or ex partner: Not on file    Emotionally abused: Not on file    Physically abused: Not on file    Forced sexual activity: Not on file  Other Topics Concern  . Not on file  Social History Narrative  . Not on file    FAMILY HISTORY: Family History  Problem Relation Age of Onset  . Heart disease Mother     ALLERGIES:  has No Known Allergies.  MEDICATIONS:  Current Outpatient Medications  Medication Sig Dispense Refill  . B COMPLEX-C-CALCIUM PO Take 1 tablet by mouth daily.    . carvedilol (COREG) 6.25 MG tablet Take 6.25 mg by mouth 2 (two) times daily with a meal.     . dexamethasone (DECADRON) 4 MG tablet Take 1 tablet (4 mg total) by mouth daily with breakfast. 15 tablet 0  . iron polysaccharides (NIFEREX) 150 MG capsule Take 1 capsule (150 mg total) by mouth daily. 100 capsule 1  . levothyroxine (SYNTHROID, LEVOTHROID) 125 MCG tablet Take 1 tablet (125 mcg total) by mouth daily before breakfast. 30 tablet 0  . losartan (COZAAR) 25 MG tablet Take 1 tablet (25 mg total) by mouth daily. 90 tablet 1  . OVER THE COUNTER MEDICATION Take 2 tablets by mouth daily. VITAFUSION gummies    . potassium chloride SA (K-DUR,KLOR-CON) 20 MEQ tablet Take 1  tablet (20 mEq total) by mouth daily. 90 tablet 3  . torsemide (DEMADEX) 20 MG tablet Take 0.5 tablets (10 mg total) by mouth daily. 90 tablet 3  . Vitamin E 400 units TABS Take 800 Units by mouth daily.      No current facility-administered medications for this visit.     REVIEW OF SYSTEMS:    A 10+ POINT REVIEW OF SYSTEMS WAS OBTAINED including neurology, dermatology, psychiatry, cardiac, respiratory, lymph, extremities, GI, GU, Musculoskeletal, constitutional, breasts, reproductive, HEENT.  All pertinent positives are noted in the HPI.  All others are  negative.   PHYSICAL EXAMINATION:  ECOG PERFORMANCE STATUS: 2 - Symptomatic, <50% confined to bed  Vitals:   12/13/17 1413  BP: (!) 129/56  Pulse: 78  Resp: 18  Temp: 98.1 F (36.7 C)  TempSrc: Oral  SpO2: 92%  Weight: 115 lb 3.2 oz (52.3 kg)  Height: 5\' 3"  (1.6 m)   Body mass index is 20.41 kg/m.  GENERAL:alert, in no acute distress and comfortable SKIN: no acute rashes, no significant lesions EYES: conjunctiva are pink and non-injected, sclera anicteric OROPHARYNX: MMM, no exudates, no oropharyngeal erythema or ulceration NECK: supple, no JVD LYMPH:  no palpable lymphadenopathy in the cervical, axillary or inguinal regions LUNGS: clear to auscultation b/l with normal respiratory effort HEART: regular rate & rhythm ABDOMEN:  normoactive bowel sounds , tender over the spleen, not distended, palpable splenomegaly  Extremity: 2+ pedal edema PSYCH: alert & oriented x 3 with fluent speech NEURO: no focal motor/sensory deficits   LABORATORY DATA:  I have reviewed the data as listed  . CBC Latest Ref Rng & Units 12/13/2017 10/11/2017 07/31/2017  WBC 4.0 - 10.5 K/uL 26.3(H) - 7.7  Hemoglobin 12.0 - 15.0 g/dL 9.2(L) 9.4(L) 9.9(L)  Hematocrit 36.0 - 46.0 % 28.7(L) 28.5(L) 31.9(L)  Platelets 150 - 400 K/uL 76(L) - 129(L)    . CBC    Component Value Date/Time   WBC 26.3 (H) 12/13/2017 1356   RBC 3.12 (L) 12/13/2017 1356    HGB 9.2 (L) 12/13/2017 1356   HGB 9.4 (L) 10/11/2017 1439   HGB 11.4 (L) 09/27/2016 1251   HCT 28.7 (L) 12/13/2017 1356   HCT 28.5 (L) 10/11/2017 1439   HCT 32.7 (L) 09/27/2016 1251   PLT 76 (L) 12/13/2017 1356   PLT 198 05/30/2017 0843   PLT 223 09/27/2016 1251   PLT 118 (L) 05/04/2016 1435   MCV 92.0 12/13/2017 1356   MCV 84.9 09/27/2016 1251   MCH 29.5 12/13/2017 1356   MCHC 32.1 12/13/2017 1356   RDW 18.3 (H) 12/13/2017 1356   RDW 16.7 (H) 09/27/2016 1251   LYMPHSABS PENDING 12/13/2017 1356   LYMPHSABS 1.1 09/27/2016 1251   MONOABS PENDING 12/13/2017 1356   MONOABS 0.5 09/27/2016 1251   EOSABS PENDING 12/13/2017 1356   EOSABS 0.0 09/27/2016 1251   EOSABS 0.1 05/04/2016 1435   BASOSABS PENDING 12/13/2017 1356   BASOSABS 0.0 09/27/2016 1251    . CMP Latest Ref Rng & Units 12/13/2017 10/11/2017 07/31/2017  Glucose 70 - 99 mg/dL 89 74 84  BUN 8 - 23 mg/dL 15 11 16   Creatinine 0.44 - 1.00 mg/dL 0.81 0.79 1.00  Sodium 135 - 145 mmol/L 136 133(L) 136  Potassium 3.5 - 5.1 mmol/L 4.2 4.2 4.1  Chloride 98 - 111 mmol/L 103 97 97(L)  CO2 22 - 32 mmol/L 26 23 31   Calcium 8.9 - 10.3 mg/dL 8.3(L) 8.4(L) 9.1  Total Protein 6.5 - 8.1 g/dL 8.3(H) - 8.3(H)  Total Bilirubin 0.3 - 1.2 mg/dL 1.1 - 0.8  Alkaline Phos 38 - 126 U/L 76 - 106  AST 15 - 41 U/L 35 - 26  ALT 0 - 44 U/L <6 - 9    Lab Results  Component Value Date   FERRITIN 157 (H) 10/11/2017   . Lab Results  Component Value Date   LDH 556 (H) 12/13/2017            RADIOGRAPHIC STUDIES: I have personally reviewed the radiological images as listed and agreed with the findings in the report. No  results found.  ASSESSMENT & PLAN:   84 y.o. with   1) Splenic marginal zone lymphoma On presentation patient had anemia, lymphocytosis, thrombocytopenia and significant constitutional symptoms including fatigue, some low-grade fevers and night sweats. Also had abdominal discomfort related to her splenomegaly and  anorexia with spleen of about 23cms on PET/CT. Serum kappa lambda free light chain ratio abnormally elevated. Myeloma panel no M spike IFE shows polyclonal gammopathy  Patient is status post Rituxan weekly 4 doses (06/27/16-07/22/16)  05/30/17 flow cytometry shows persistent SMZL clonal lymphocytes.   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  Thrombocytopenia improved PLT and lymphocytosis has remained resolved   Decreased splenomegaly significantly as per rpt [place] abd on 09/20/16. Stable as seen on 05/19/17 CT AP.   3) hypothyroidism- on levothyroxine  4) nonischemic systolic cardiomyopathy -  Cardiac catheterization December 2017- nonobstructive coronary artery disease. - EF decreased to 25-30% on 11/01/16 ECHO  5) Rigors and fever related to Rituxan vs Tumor lysis after 1st dose. Has not had any of these issues with adjusted pre-medications with the 2nd dose of Rituxan and is overall feeling much better.  6) Weakness and fatigue - likely multifactorial - CHF, iron deficiency, age, hypothyroidism. Less likely related primarily to [place] .  PLAN:  -Discussed pt labwork today, 12/13/17; WBC increased to 26.3k, HGB decreased to 9.2, PLT decreased to 76k. Albumin decreased to 2.9. LDH increased to 556 -Continue 150mg  Iron Polysaccharide po daily -12/13/17 Differential, and Ferritin are pending -Discussed that the patient's labs today do reveal concern that her Splenic Marginal Zone Lymphoma is returning, which is clinically consistent with palpable splenomegaly  -Pt has lost 12 pounds in the last 4 months  -Discussed the treatment option of returning to Rituxan which the pt prefers -Will set the pt up for Rituxan in the next 1-2 weeks -Will begin 4mg  Dexamethasone once a day with breakfast -Will order US Abdomen  -Will give flu vaccine today in clinic  -Will see the pt back in 7 days, and will consider a blood transfusion   6) Bilateral  ankle swelling with left ankle induration -Previous Doppler negative for DVT PLAN  -I previously recommended she continues to use compression socks, epsom salt bath and continue to RICE   [place] abd in 5-6 days Please schedule Rituxan weekly x 4 doses starting in 7 days with labs and MD visit   All of the patients and her accompanying family's questions were answered with apparent satisfaction. The patient knows to call the clinic with any problems, questions or concerns.  The total time spent in the appt was 30 minutes and more than 50% was on counseling and direct patient cares.      Sullivan Lone MD [place] AAHIVMS [facility] Hematology/Oncology Physician [facility]  (Office):       [phone] (Work cell):  [phone] (Fax):           [phone]  I, Baldwin Jamaica, am acting as a scribe for Dr. Sullivan Lone.   .I have reviewed the above documentation for accuracy and completeness, and I agree with the above. Brunetta Genera MD

## 2017-12-13 ENCOUNTER — Inpatient Hospital Stay (HOSPITAL_BASED_OUTPATIENT_CLINIC_OR_DEPARTMENT_OTHER): Payer: Medicare Other | Admitting: Hematology

## 2017-12-13 ENCOUNTER — Inpatient Hospital Stay: Payer: Medicare Other | Attending: Hematology

## 2017-12-13 VITALS — BP 129/56 | HR 78 | Temp 98.1°F | Resp 18 | Ht 63.0 in | Wt 115.2 lb

## 2017-12-13 DIAGNOSIS — Z87891 Personal history of nicotine dependence: Secondary | ICD-10-CM | POA: Diagnosis not present

## 2017-12-13 DIAGNOSIS — D696 Thrombocytopenia, unspecified: Secondary | ICD-10-CM

## 2017-12-13 DIAGNOSIS — I10 Essential (primary) hypertension: Secondary | ICD-10-CM | POA: Diagnosis not present

## 2017-12-13 DIAGNOSIS — Z79899 Other long term (current) drug therapy: Secondary | ICD-10-CM

## 2017-12-13 DIAGNOSIS — Z23 Encounter for immunization: Secondary | ICD-10-CM | POA: Diagnosis not present

## 2017-12-13 DIAGNOSIS — D649 Anemia, unspecified: Secondary | ICD-10-CM | POA: Diagnosis not present

## 2017-12-13 DIAGNOSIS — C8307 Small cell B-cell lymphoma, spleen: Secondary | ICD-10-CM | POA: Insufficient documentation

## 2017-12-13 DIAGNOSIS — E039 Hypothyroidism, unspecified: Secondary | ICD-10-CM | POA: Insufficient documentation

## 2017-12-13 DIAGNOSIS — D509 Iron deficiency anemia, unspecified: Secondary | ICD-10-CM

## 2017-12-13 LAB — CMP (CANCER CENTER ONLY)
AST: 35 U/L (ref 15–41)
Albumin: 2.9 g/dL — ABNORMAL LOW (ref 3.5–5.0)
Alkaline Phosphatase: 76 U/L (ref 38–126)
Anion gap: 7 (ref 5–15)
BILIRUBIN TOTAL: 1.1 mg/dL (ref 0.3–1.2)
BUN: 15 mg/dL (ref 8–23)
CO2: 26 mmol/L (ref 22–32)
CREATININE: 0.81 mg/dL (ref 0.44–1.00)
Calcium: 8.3 mg/dL — ABNORMAL LOW (ref 8.9–10.3)
Chloride: 103 mmol/L (ref 98–111)
Glucose, Bld: 89 mg/dL (ref 70–99)
POTASSIUM: 4.2 mmol/L (ref 3.5–5.1)
Sodium: 136 mmol/L (ref 135–145)
TOTAL PROTEIN: 8.3 g/dL — AB (ref 6.5–8.1)

## 2017-12-13 LAB — CBC WITH DIFFERENTIAL/PLATELET
Abs Immature Granulocytes: 0 10*3/uL (ref 0.00–0.07)
BASOS PCT: 0 %
Basophils Absolute: 0 10*3/uL (ref 0.0–0.1)
EOS PCT: 0 %
Eosinophils Absolute: 0 10*3/uL (ref 0.0–0.5)
HEMATOCRIT: 28.7 % — AB (ref 36.0–46.0)
HEMOGLOBIN: 9.2 g/dL — AB (ref 12.0–15.0)
Lymphocytes Relative: 52 %
Lymphs Abs: 13.7 10*3/uL — ABNORMAL HIGH (ref 0.7–4.0)
MCH: 29.5 pg (ref 26.0–34.0)
MCHC: 32.1 g/dL (ref 30.0–36.0)
MCV: 92 fL (ref 80.0–100.0)
Monocytes Absolute: 2.9 10*3/uL — ABNORMAL HIGH (ref 0.1–1.0)
Monocytes Relative: 11 %
Neutro Abs: 9.7 10*3/uL (ref 1.7–17.7)
Neutrophils Relative %: 37 %
Platelets: 76 10*3/uL — ABNORMAL LOW (ref 150–400)
RBC: 3.12 MIL/uL — AB (ref 3.87–5.11)
RDW: 18.3 % — ABNORMAL HIGH (ref 11.5–15.5)
WBC: 26.3 10*3/uL — AB (ref 4.0–10.5)
nRBC: 0 % (ref 0.0–0.2)

## 2017-12-13 LAB — LACTATE DEHYDROGENASE: LDH: 556 U/L — ABNORMAL HIGH (ref 98–192)

## 2017-12-13 LAB — FERRITIN: FERRITIN: 220 ng/mL (ref 11–307)

## 2017-12-13 MED ORDER — DEXAMETHASONE 4 MG PO TABS: 4.0000 mg | ORAL_TABLET | Freq: Every day | ORAL | 0 refills | Status: DC

## 2017-12-13 MED ORDER — INFLUENZA VAC SPLIT HIGH-DOSE 0.5 ML IM SUSY
0.5000 mL | PREFILLED_SYRINGE | Freq: Once | INTRAMUSCULAR | Status: AC
  Administered 2017-12-13: 0.5 mL via INTRAMUSCULAR
  Filled 2017-12-13: qty 0.5

## 2017-12-19 ENCOUNTER — Ambulatory Visit (HOSPITAL_COMMUNITY)
Admission: RE | Admit: 2017-12-19 | Discharge: 2017-12-19 | Disposition: A | Discharge status: Home / Self Care | Payer: Medicare Other | Source: Ambulatory Visit | Attending: Hematology | Admitting: Hematology

## 2017-12-19 DIAGNOSIS — D7389 Other diseases of spleen: Secondary | ICD-10-CM | POA: Insufficient documentation

## 2017-12-19 DIAGNOSIS — C8307 Small cell B-cell lymphoma, spleen: Secondary | ICD-10-CM | POA: Insufficient documentation

## 2017-12-19 DIAGNOSIS — R932 Abnormal findings on diagnostic imaging of liver and biliary tract: Secondary | ICD-10-CM | POA: Diagnosis not present

## 2017-12-19 DIAGNOSIS — R109 Unspecified abdominal pain: Secondary | ICD-10-CM | POA: Diagnosis not present

## 2017-12-19 DIAGNOSIS — R161 Splenomegaly, not elsewhere classified: Secondary | ICD-10-CM | POA: Diagnosis present

## 2017-12-20 ENCOUNTER — Ambulatory Visit: Payer: Medicare Other | Attending: Retina Specialist | Admitting: Occupational Therapy

## 2017-12-21 NOTE — Progress Notes (Signed)
Marland Kitchen    HEMATOLOGY/ONCOLOGY CLINIC NOTE  Date of Service: 12/22/2017   Patient Care Team: [name], MD as PCP - General (Family Medicine) [name], MD as PCP - Cardiology (Cardiology)  CHIEF COMPLAINTS/PURPOSE OF CONSULTATION:  F/u for splenic marginal zone lymphoma  HISTORY OF PRESENTING ILLNESS:   Kathleen Arias is a wonderful 84 y.o. female who has been referred to [place] by Dr .Redmond Pulling, Jama Flavors, MD for evaluation and management of anemia, leukocytosis and thrombocytopenia.  Patient is a very pleasant female who is here with her daughter. She has a history of hypertension, chronic systolic CHF [ejection fraction of 25-35% based on recent cardiac catheterization on 01/13/2016, echo done showed ejection fraction of 40-45%, nonischemic cardiomyopathy], iron deficiency,  Patient presented to his primary care physician with some dyspnea on exertion which is primarily related to her systolic CHF but she was also noted to have recent blood counts with her primary care physician on 03/15/2016 which showed anemia with a hemoglobin of 9.7 and a hematocrit of 79, platelet count of 137k , WBC count of 11.7k with 7.6k lymphocytes.  Iron studies were done which showed a ferritin of 84 and an iron saturation of 9%. She has been on oral iron replacement by her primary care physician and notes significant constipation and GI intolerance. She reports that she had a FOBT in 12/2015  which was negative.   Patient notes significant fatigue and dyspnea on exertion. Notes weight loss of about 15 pounds since September 2017. Weight is down from 142 to 124lbs.   Patient denies any enlarged lymph nodes, no night sweats.  INTERVAL HISTORY   Kathleen Arias is here for follow-up for management of splenic marginal zone lymphoma. The patient's last visit with [place] was on 12/13/17. She is accompanied today by her two daughters. The pt reports that she is doing well overall.   The pt reports that she is continuing  to feel weak and tired, different from her baseline. She presents today for her first week of Rituxan. She   Of note since the patient's last visit, pt has had a US Abdomen completed on 12/19/17 with results revealing Severe splenomegaly is again noted consistent with history of lymphoma. 6 cm rounded echogenic abnormality is noted medially within the spleen consistent with necrosis as described on prior CT and PET scans. 19 mm echogenic focus seen in caudate lobe of liver. This may represent hemangioma, but malignancy or metastatic disease cannot be excluded. MRI may be performed for further evaluation. Gallbladder is not visualized. Left kidney is not visualized due to overlying spleen.  Lab results today (12/22/17) of CBC w/diff and CMP is as follows: all values are WNL except for WBC at 31.7k, RBC at 3.03, HGB at 9.0, HCT at 28.8, RDW at 18.3, PLT at 84k, Lymphs abs at 23.8k, Monocytes abs at 1.9k, Calcium at 8.1, Albumin at 2.8. 12/22/17 LDH is pending  On review of systems, pt reports feeling tired, feeling weak,  and denies concerns for infections, abdominal pain, leg swelling, and any other symptoms.    MEDICAL HISTORY:  Past Medical History:  Diagnosis Date  . Anemia   . Anginal pain ([place])   . Arthritis   . Cancer ([place])   . CHF (congestive heart failure) ([place])   . Dyspnea   . Dysrhythmia   . Hypertension   . Hypothyroidism   . Thyroid disease   Chronic systolic CHF Iron deficiency   SURGICAL HISTORY: Past Surgical History:  Procedure Laterality Date  . CARDIAC CATHETERIZATION N/A 01/07/2016   Procedure: Left Heart Cath and Coronary Angiography;  Surgeon: Jettie Booze, MD;  Location: [facility];  Service: Cardiovascular;  Laterality: N/A;  . CATARACT EXTRACTION, BILATERAL    . FRACTURE SURGERY    . HIP SURGERY    . JOINT REPLACEMENT    . TONSILLECTOMY      SOCIAL HISTORY: Social History   Socioeconomic History  . Marital status: Single    Spouse name:  Not on file  . Number of children: Not on file  . Years of education: Not on file  . Highest education level: Not on file  Occupational History  . Not on file  Social Needs  . Financial resource strain: Not on file  . Food insecurity:    Worry: Not on file    Inability: Not on file  . Transportation needs:    Medical: Not on file    Non-medical: Not on file  Tobacco Use  . Smoking status: Former Research scientist (life sciences)  . Smokeless tobacco: Never Used  Substance and Sexual Activity  . Alcohol use: No  . Drug use: No  . Sexual activity: Not on file  Lifestyle  . Physical activity:    Days per week: Not on file    Minutes per session: Not on file  . Stress: Not on file  Relationships  . Social connections:    Talks on phone: Not on file    Gets together: Not on file    Attends religious service: Not on file    Active member of club or organization: Not on file    Attends meetings of clubs or organizations: Not on file    Relationship status: Not on file  . Intimate partner violence:    Fear of current or ex partner: Not on file    Emotionally abused: Not on file    Physically abused: Not on file    Forced sexual activity: Not on file  Other Topics Concern  . Not on file  Social History Narrative  . Not on file    FAMILY HISTORY: Family History  Problem Relation Age of Onset  . Heart disease Mother     ALLERGIES:  has No Known Allergies.  MEDICATIONS:  Current Outpatient Medications  Medication Sig Dispense Refill  . B COMPLEX-C-CALCIUM PO Take 1 tablet by mouth daily.    . carvedilol (COREG) 6.25 MG tablet Take 6.25 mg by mouth 2 (two) times daily with a meal.     . iron polysaccharides (NIFEREX) 150 MG capsule Take 1 capsule (150 mg total) by mouth daily. 100 capsule 1  . levothyroxine (SYNTHROID, LEVOTHROID) 125 MCG tablet Take 1 tablet (125 mcg total) by mouth daily before breakfast. 30 tablet 0  . losartan (COZAAR) 25 MG tablet Take 1 tablet (25 mg total) by mouth daily.  90 tablet 1  . OVER THE COUNTER MEDICATION Take 2 tablets by mouth daily. VITAFUSION gummies    . potassium chloride SA (K-DUR,KLOR-CON) 20 MEQ tablet Take 1 tablet (20 mEq total) by mouth daily. 90 tablet 3  . torsemide (DEMADEX) 20 MG tablet Take 0.5 tablets (10 mg total) by mouth daily. 90 tablet 3  . Vitamin E 400 units TABS Take 800 Units by mouth daily.      No current facility-administered medications for this visit.     REVIEW OF SYSTEMS:    A 10+ POINT REVIEW OF SYSTEMS WAS OBTAINED including neurology, dermatology, psychiatry,  cardiac, respiratory, lymph, extremities, GI, GU, Musculoskeletal, constitutional, breasts, reproductive, HEENT.  All pertinent positives are noted in the HPI.  All others are negative.   PHYSICAL EXAMINATION:  ECOG PERFORMANCE STATUS: 2 - Symptomatic, <50% confined to bed  Vitals:   12/22/17 1152  BP: 132/62  Pulse: 74  Resp: 18  Temp: 98.1 F (36.7 C)  TempSrc: Oral  SpO2: 91%  Height: 5\' 3"  (1.6 m)   Body mass index is 20.41 kg/m.   GENERAL:alert, in no acute distress and comfortable SKIN: no acute rashes, no significant lesions EYES: conjunctiva are pink and non-injected, sclera anicteric OROPHARYNX: MMM, no exudates, no oropharyngeal erythema or ulceration NECK: supple, no JVD LYMPH:  no palpable lymphadenopathy in the cervical, axillary or inguinal regions LUNGS: clear to auscultation b/l with normal respiratory effort HEART: regular rate & rhythm ABDOMEN:  normoactive bowel sounds , tender over the spleen, not distended. Palpable splenomegaly.  Extremity: no pedal edema PSYCH: alert & oriented x 3 with fluent speech NEURO: no focal motor/sensory deficits   LABORATORY DATA:  I have reviewed the data as listed  . CBC Latest Ref Rng & Units 12/22/2017 12/13/2017 10/11/2017  WBC 4.0 - 10.5 K/uL 31.7(H) 26.3(H) -  Hemoglobin 12.0 - 15.0 g/dL 9.0(L) 9.2(L) 9.4(L)  Hematocrit 36.0 - 46.0 % 28.8(L) 28.7(L) 28.5(L)  Platelets 150 - 400  K/uL 84(L) 76(L) -    . CBC    Component Value Date/Time   WBC 31.7 (H) 12/22/2017 1051   RBC 3.03 (L) 12/22/2017 1051   HGB 9.0 (L) 12/22/2017 1051   HGB 9.4 (L) 10/11/2017 1439   HGB 11.4 (L) 09/27/2016 1251   HCT 28.8 (L) 12/22/2017 1051   HCT 28.5 (L) 10/11/2017 1439   HCT 32.7 (L) 09/27/2016 1251   PLT 84 (L) 12/22/2017 1051   PLT 198 05/30/2017 0843   PLT 223 09/27/2016 1251   PLT 118 (L) 05/04/2016 1435   MCV 95.0 12/22/2017 1051   MCV 84.9 09/27/2016 1251   MCH 29.7 12/22/2017 1051   MCHC 31.3 12/22/2017 1051   RDW 18.3 (H) 12/22/2017 1051   RDW 16.7 (H) 09/27/2016 1251   LYMPHSABS 23.8 (H) 12/22/2017 1051   LYMPHSABS 1.1 09/27/2016 1251   MONOABS 1.9 (H) 12/22/2017 1051   MONOABS 0.5 09/27/2016 1251   EOSABS 0.0 12/22/2017 1051   EOSABS 0.0 09/27/2016 1251   EOSABS 0.1 05/04/2016 1435   BASOSABS 0.0 12/22/2017 1051   BASOSABS 0.0 09/27/2016 1251    . CMP Latest Ref Rng & Units 12/22/2017 12/13/2017 10/11/2017  Glucose 70 - 99 mg/dL 85 89 74  BUN 8 - 23 mg/dL 17 15 11   Creatinine 0.44 - 1.00 mg/dL 0.83 0.81 0.79  Sodium 135 - 145 mmol/L 137 136 133(L)  Potassium 3.5 - 5.1 mmol/L 4.1 4.2 4.2  Chloride 98 - 111 mmol/L 104 103 97  CO2 22 - 32 mmol/L 25 26 23   Calcium 8.9 - 10.3 mg/dL 8.1(L) 8.3(L) 8.4(L)  Total Protein 6.5 - 8.1 g/dL 7.9 8.3(H) -  Total Bilirubin 0.3 - 1.2 mg/dL 1.0 1.1 -  Alkaline Phos 38 - 126 U/L 72 76 -  AST 15 - 41 U/L 32 35 -  ALT 0 - 44 U/L <6 <6 -    Lab Results  Component Value Date   FERRITIN 220 12/13/2017   . Lab Results  Component Value Date   LDH 556 (H) 12/13/2017            RADIOGRAPHIC STUDIES: I  have personally reviewed the radiological images as listed and agreed with the findings in the report. US Abdomen Complete  Result Date: 12/19/2017 CLINICAL DATA:  Splenic marginal zone B-cell lymphoma. Abdominal pain. EXAM: ABDOMEN ULTRASOUND COMPLETE COMPARISON:  CT scan of May 19, 2017. Ultrasound of September 20, 2016. PET scan of Jun 06, 2016. FINDINGS: Gallbladder: Gallbladder is not identified. Common bile duct: Diameter: 4 mm which is within normal limits. Liver: 19 mm echogenic lesion is seen in the caudate lobe. Within normal limits in parenchymal echogenicity. Portal vein is patent on color Doppler imaging with normal direction of blood flow towards the liver. IVC: No abnormality visualized. Pancreas: Not well visualized due to overlying enlarged spleen. Spleen: Severely enlarged with maximum measured diameter of 21 cm. Focal echogenic rounded mass lesion measuring 6 cm is noted consistent with necrosis as described on prior CT scan and PET scan. Right Kidney: Length: 7.7 cm. Echogenicity within normal limits. No mass or hydronephrosis visualized. Left Kidney: Not visualized due to enlarged spleen. Abdominal aorta: No aneurysm visualized. Other findings: None. IMPRESSION: Severe splenomegaly is again noted consistent with history of lymphoma. 6 cm rounded echogenic abnormality is noted medially within the spleen consistent with necrosis as described on prior CT and PET scans. 19 mm echogenic focus seen in caudate lobe of liver. This may represent hemangioma, but malignancy or metastatic disease cannot be excluded. MRI may be performed for further evaluation. Gallbladder is not visualized. Left kidney is not visualized due to overlying spleen. Electronically Signed   By: Marijo Conception, M.D.   On: 12/19/2017 16:15    ASSESSMENT & PLAN:   84 y.o. with   1) Splenic marginal zone lymphoma On presentation patient had anemia, lymphocytosis, thrombocytopenia and significant constitutional symptoms including fatigue, some low-grade fevers and night sweats. Also had abdominal discomfort related to her splenomegaly and anorexia with spleen of about 23cms on PET/CT. Serum kappa lambda free light chain ratio abnormally elevated. Myeloma panel no M spike IFE shows polyclonal gammopathy  Patient is status post Rituxan  weekly 4 doses (06/27/16-07/22/16)  05/30/17 flow cytometry shows persistent SMZL clonal lymphocytes.   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  Thrombocytopenia improved PLT and lymphocytosis has remained resolved   Decreased splenomegaly significantly as per rpt [place] abd on 09/20/16. Stable as seen on 05/19/17 CT AP.   3) hypothyroidism- on levothyroxine  4) nonischemic systolic cardiomyopathy -  Cardiac catheterization December 2017- nonobstructive coronary artery disease. - EF decreased to 25-30% on 11/01/16 ECHO  5) Rigors and fever related to Rituxan vs Tumor lysis after 1st dose. Has not had any of these issues with adjusted pre-medications with the 2nd dose of Rituxan and is overall feeling much better.  6) Weakness and fatigue - likely multifactorial - CHF, iron deficiency, age, hypothyroidism. Less likely related primarily to [place] .  PLAN:   -Discussed pt labwork today, 12/22/17; Lymphs increased to 23.8k, PLT increased to 84k, HGB stable at 9.0 -12/22/17 Flow cytometry is pending -Discussed the 12/19/17 US Abdomen which revealed Severe splenomegaly is again noted consistent with history of lymphoma. 6 cm rounded echogenic abnormality is noted medially within the spleen consistent with necrosis as described on prior CT and PET scans. 19 mm echogenic focus seen in caudate lobe of liver. This may represent hemangioma, but malignancy or metastatic disease cannot be excluded. MRI may be performed for further evaluation. Gallbladder is not visualized. Left kidney is not visualized due to overlying  spleen. -The pt has no prohibitive toxicities from beginning Rituxan at this time.    -Continue 150mg  Iron Polysaccharide po daily -Will plan for 4 weekly infusions of Rituxan, and possible consideration of maintenance Rituxan according to response -Will see the pt back in 2 weeks  6) Bilateral ankle swelling with left ankle induration -Previous  Doppler negative for DVT PLAN  -I previously recommended she continues to use compression socks, epsom salt bath and continue to RICE   Plz f/u with weekly Rituxan as scheduled for a total of 4 doses with labs and with Dr [name] in 1 week as scheduled.    All of the patients and her accompanying family's questions were answered with apparent satisfaction. The patient knows to call the clinic with any problems, questions or concerns.  The total time spent in the appt was 20 minutes and more than 50% was on counseling and direct patient cares.       Sullivan Lone MD [employer] AAHIVMS [facility] Hematology/Oncology [facility]  (Office):       [phone] (Work cell):  [phone] (Fax):           [phone]  I, Baldwin [place], am acting as a scribe for Dr. Sullivan Lone.   .I have reviewed the above documentation for accuracy and completeness, and I agree with the above. Brunetta Genera MD

## 2017-12-22 ENCOUNTER — Inpatient Hospital Stay (HOSPITAL_BASED_OUTPATIENT_CLINIC_OR_DEPARTMENT_OTHER): Payer: Medicare Other | Admitting: Hematology

## 2017-12-22 ENCOUNTER — Inpatient Hospital Stay: Payer: Medicare Other

## 2017-12-22 ENCOUNTER — Inpatient Hospital Stay: Payer: Medicare Other | Attending: Hematology

## 2017-12-22 VITALS — BP 142/78 | HR 80 | Temp 98.1°F | Resp 16

## 2017-12-22 VITALS — BP 132/62 | HR 74 | Temp 98.1°F | Resp 18 | Ht 63.0 in

## 2017-12-22 DIAGNOSIS — D649 Anemia, unspecified: Secondary | ICD-10-CM | POA: Insufficient documentation

## 2017-12-22 DIAGNOSIS — R161 Splenomegaly, not elsewhere classified: Secondary | ICD-10-CM | POA: Insufficient documentation

## 2017-12-22 DIAGNOSIS — C8307 Small cell B-cell lymphoma, spleen: Secondary | ICD-10-CM

## 2017-12-22 DIAGNOSIS — Z87891 Personal history of nicotine dependence: Secondary | ICD-10-CM | POA: Diagnosis not present

## 2017-12-22 DIAGNOSIS — D696 Thrombocytopenia, unspecified: Secondary | ICD-10-CM | POA: Diagnosis not present

## 2017-12-22 DIAGNOSIS — Z5111 Encounter for antineoplastic chemotherapy: Secondary | ICD-10-CM | POA: Insufficient documentation

## 2017-12-22 DIAGNOSIS — I2510 Atherosclerotic heart disease of native coronary artery without angina pectoris: Secondary | ICD-10-CM | POA: Insufficient documentation

## 2017-12-22 DIAGNOSIS — Z79899 Other long term (current) drug therapy: Secondary | ICD-10-CM | POA: Insufficient documentation

## 2017-12-22 DIAGNOSIS — E039 Hypothyroidism, unspecified: Secondary | ICD-10-CM | POA: Insufficient documentation

## 2017-12-22 DIAGNOSIS — D72820 Lymphocytosis (symptomatic): Secondary | ICD-10-CM | POA: Diagnosis not present

## 2017-12-22 LAB — CMP (CANCER CENTER ONLY)
ALK PHOS: 72 U/L (ref 38–126)
ALT: <6 U/L (ref 0–44)
AST: 32 U/L (ref 15–41)
Albumin: 2.8 g/dL — ABNORMAL LOW (ref 3.5–5.0)
Anion gap: 8 (ref 5–15)
BUN: 17 mg/dL (ref 8–23)
CO2: 25 mmol/L (ref 22–32)
Calcium: 8.1 mg/dL — ABNORMAL LOW (ref 8.9–10.3)
Chloride: 104 mmol/L (ref 98–111)
Creatinine: 0.83 mg/dL (ref 0.44–1.00)
GFR, Est AFR Am: >60 mL/min (ref >60)
GFR, Estimated: >60 mL/min (ref >60)
Glucose, Bld: 85 mg/dL (ref 70–99)
Potassium: 4.1 mmol/L (ref 3.5–5.1)
Sodium: 137 mmol/L (ref 135–145)
Total Bilirubin: 1 mg/dL (ref 0.3–1.2)
Total Protein: 7.9 g/dL (ref 6.5–8.1)

## 2017-12-22 LAB — CBC WITH DIFFERENTIAL/PLATELET
Abs Immature Granulocytes: 0 10*3/uL (ref 0.00–0.07)
Basophils Absolute: 0 10*3/uL (ref 0.0–0.1)
Basophils Relative: 0 %
EOS ABS: 0 10*3/uL (ref 0.0–0.5)
Eosinophils Relative: 0 %
HCT: 28.8 % — ABNORMAL LOW (ref 36.0–46.0)
Hemoglobin: 9 g/dL — ABNORMAL LOW (ref 12.0–15.0)
Lymphocytes Relative: 75 %
Lymphs Abs: 23.8 10*3/uL — ABNORMAL HIGH (ref 0.7–4.0)
MCH: 29.7 pg (ref 26.0–34.0)
MCHC: 31.3 g/dL (ref 30.0–36.0)
MCV: 95 fL (ref 80.0–100.0)
Monocytes Absolute: 1.9 10*3/uL — ABNORMAL HIGH (ref 0.1–1.0)
Monocytes Relative: 6 %
Neutro Abs: 6 10*3/uL (ref 1.7–17.7)
Neutrophils Relative %: 19 %
Platelets: 84 10*3/uL — ABNORMAL LOW (ref 150–400)
RBC: 3.03 MIL/uL — ABNORMAL LOW (ref 3.87–5.11)
RDW: 18.3 % — ABNORMAL HIGH (ref 11.5–15.5)
WBC: 31.7 10*3/uL — ABNORMAL HIGH (ref 4.0–10.5)
nRBC: 0 % (ref 0.0–0.2)

## 2017-12-22 MED ORDER — FAMOTIDINE 20 MG PO TABS
20.0000 mg | ORAL_TABLET | Freq: Once | ORAL | Status: AC
  Administered 2017-12-22: 20 mg via ORAL

## 2017-12-22 MED ORDER — SODIUM CHLORIDE 0.9 % IV SOLN
375.0000 mg/m2 | Freq: Once | INTRAVENOUS | Status: AC
  Administered 2017-12-22: 600 mg via INTRAVENOUS
  Filled 2017-12-22: qty 50

## 2017-12-22 MED ORDER — MEPERIDINE HCL 25 MG/ML IJ SOLN
INTRAMUSCULAR | Status: AC
  Filled 2017-12-22: qty 1

## 2017-12-22 MED ORDER — ACETAMINOPHEN 500 MG PO TABS
1000.0000 mg | ORAL_TABLET | Freq: Once | ORAL | Status: AC
  Administered 2017-12-22: 1000 mg via ORAL

## 2017-12-22 MED ORDER — SODIUM CHLORIDE 0.9 % IV SOLN
Freq: Once | INTRAVENOUS | Status: DC
  Filled 2017-12-22: qty 250

## 2017-12-22 MED ORDER — MEPERIDINE HCL 25 MG/ML IJ SOLN
12.5000 mg | Freq: Once | INTRAMUSCULAR | Status: AC
  Administered 2017-12-22: 12.5 mg via INTRAVENOUS

## 2017-12-22 MED ORDER — DIPHENHYDRAMINE HCL 25 MG PO CAPS
ORAL_CAPSULE | ORAL | Status: AC
  Filled 2017-12-22: qty 2

## 2017-12-22 MED ORDER — FAMOTIDINE 20 MG PO TABS
ORAL_TABLET | ORAL | Status: AC
  Filled 2017-12-22: qty 1

## 2017-12-22 MED ORDER — ACETAMINOPHEN 500 MG PO TABS
ORAL_TABLET | ORAL | Status: AC
  Filled 2017-12-22: qty 2

## 2017-12-22 MED ORDER — SODIUM CHLORIDE 0.9 % IV SOLN
12.0000 mg | Freq: Once | INTRAVENOUS | Status: AC
  Administered 2017-12-22: 12 mg via INTRAVENOUS
  Filled 2017-12-22: qty 1.2

## 2017-12-22 MED ORDER — DIPHENHYDRAMINE HCL 25 MG PO CAPS
50.0000 mg | ORAL_CAPSULE | Freq: Once | ORAL | Status: AC
  Administered 2017-12-22: 50 mg via ORAL

## 2017-12-22 NOTE — Patient Instructions (Signed)
[facility] Discharge Instructions for Patients Receiving Chemotherapy  Today you received the following chemotherapy agents Rituxan To help prevent nausea and vomiting after your treatment, we encourage you to take your nausea medication as prescribed.  If you develop nausea and vomiting that is not controlled by your nausea medication, call the clinic.   BELOW ARE SYMPTOMS THAT SHOULD BE REPORTED IMMEDIATELY:  *FEVER GREATER THAN 100.5 F  *CHILLS WITH OR WITHOUT FEVER  NAUSEA AND VOMITING THAT IS NOT CONTROLLED WITH YOUR NAUSEA MEDICATION  *UNUSUAL SHORTNESS OF BREATH  *UNUSUAL BRUISING OR BLEEDING  TENDERNESS IN MOUTH AND THROAT WITH OR WITHOUT PRESENCE OF ULCERS  *URINARY PROBLEMS  *BOWEL PROBLEMS  UNUSUAL RASH Items with * indicate a potential emergency and should be followed up as soon as possible.  Feel free to call the clinic you have any questions or concerns. The clinic phone number is [phone].  Please show the CHEMO ALERT CARD at check-in to the Emergency Department and triage nurse.   

## 2017-12-29 ENCOUNTER — Inpatient Hospital Stay: Payer: Medicare Other

## 2017-12-29 ENCOUNTER — Other Ambulatory Visit: Payer: Self-pay | Admitting: Hematology

## 2017-12-29 ENCOUNTER — Inpatient Hospital Stay (HOSPITAL_BASED_OUTPATIENT_CLINIC_OR_DEPARTMENT_OTHER): Payer: Medicare Other | Admitting: Hematology

## 2017-12-29 VITALS — BP 145/74 | HR 76 | Temp 98.2°F | Resp 18 | Ht 63.0 in

## 2017-12-29 VITALS — BP 135/79 | HR 89 | Temp 97.4°F | Resp 18

## 2017-12-29 DIAGNOSIS — D696 Thrombocytopenia, unspecified: Secondary | ICD-10-CM

## 2017-12-29 DIAGNOSIS — Z5112 Encounter for antineoplastic immunotherapy: Secondary | ICD-10-CM

## 2017-12-29 DIAGNOSIS — E039 Hypothyroidism, unspecified: Secondary | ICD-10-CM

## 2017-12-29 DIAGNOSIS — C8307 Small cell B-cell lymphoma, spleen: Secondary | ICD-10-CM | POA: Diagnosis not present

## 2017-12-29 DIAGNOSIS — Z5111 Encounter for antineoplastic chemotherapy: Secondary | ICD-10-CM | POA: Diagnosis not present

## 2017-12-29 DIAGNOSIS — D649 Anemia, unspecified: Secondary | ICD-10-CM | POA: Diagnosis not present

## 2017-12-29 DIAGNOSIS — Z79899 Other long term (current) drug therapy: Secondary | ICD-10-CM

## 2017-12-29 DIAGNOSIS — D72820 Lymphocytosis (symptomatic): Secondary | ICD-10-CM

## 2017-12-29 DIAGNOSIS — R161 Splenomegaly, not elsewhere classified: Secondary | ICD-10-CM | POA: Diagnosis not present

## 2017-12-29 LAB — CBC WITH DIFFERENTIAL/PLATELET
Abs Immature Granulocytes: 0 10*3/uL (ref 0.00–0.07)
Band Neutrophils: 2 %
Basophils Absolute: 0 10*3/uL (ref 0.0–0.1)
Basophils Relative: 0 %
Eosinophils Absolute: 0 10*3/uL (ref 0.0–0.5)
Eosinophils Relative: 0 %
HCT: 27.3 % — ABNORMAL LOW (ref 36.0–46.0)
Hemoglobin: 8.9 g/dL — ABNORMAL LOW (ref 12.0–15.0)
Lymphocytes Relative: 72 %
Lymphs Abs: 19.8 10*3/uL — ABNORMAL HIGH (ref 0.7–4.0)
MCH: 30.5 pg (ref 26.0–34.0)
MCHC: 32.6 g/dL (ref 30.0–36.0)
MCV: 93.5 fL (ref 80.0–100.0)
Monocytes Absolute: 1.4 10*3/uL — ABNORMAL HIGH (ref 0.1–1.0)
Monocytes Relative: 5 %
NEUTROS ABS: 6.3 10*3/uL (ref 1.7–17.7)
Neutrophils Relative %: 21 %
Platelets: 91 10*3/uL — ABNORMAL LOW (ref 150–400)
RBC: 2.92 MIL/uL — AB (ref 3.87–5.11)
RDW: 18.3 % — ABNORMAL HIGH (ref 11.5–15.5)
WBC: 27.5 10*3/uL — ABNORMAL HIGH (ref 4.0–10.5)
nRBC: 0 % (ref 0.0–0.2)

## 2017-12-29 LAB — CMP (CANCER CENTER ONLY)
ALT: <6 U/L (ref 0–44)
AST: 27 U/L (ref 15–41)
Albumin: 2.8 g/dL — ABNORMAL LOW (ref 3.5–5.0)
Alkaline Phosphatase: 72 U/L (ref 38–126)
Anion gap: 8 (ref 5–15)
BUN: 15 mg/dL (ref 8–23)
CO2: 26 mmol/L (ref 22–32)
Calcium: 8.2 mg/dL — ABNORMAL LOW (ref 8.9–10.3)
Chloride: 101 mmol/L (ref 98–111)
Creatinine: 0.78 mg/dL (ref 0.44–1.00)
GFR, Est AFR Am: >60 mL/min (ref >60)
GFR, Estimated: >60 mL/min (ref >60)
Glucose, Bld: 85 mg/dL (ref 70–99)
POTASSIUM: 4 mmol/L (ref 3.5–5.1)
Sodium: 135 mmol/L (ref 135–145)
Total Bilirubin: 0.8 mg/dL (ref 0.3–1.2)
Total Protein: 8 g/dL (ref 6.5–8.1)

## 2017-12-29 LAB — SAMPLE TO BLOOD BANK

## 2017-12-29 LAB — URIC ACID: Uric Acid, Serum: 5 mg/dL (ref 2.5–7.1)

## 2017-12-29 LAB — FLOW CYTOMETRY

## 2017-12-29 MED ORDER — FAMOTIDINE 20 MG PO TABS
20.0000 mg | ORAL_TABLET | Freq: Once | ORAL | Status: AC
  Administered 2017-12-29: 20 mg via ORAL

## 2017-12-29 MED ORDER — FAMOTIDINE 20 MG PO TABS
ORAL_TABLET | ORAL | Status: AC
  Filled 2017-12-29: qty 1

## 2017-12-29 MED ORDER — MEPERIDINE HCL 25 MG/ML IJ SOLN
12.5000 mg | Freq: Once | INTRAMUSCULAR | Status: AC
  Administered 2017-12-29: 12.5 mg via INTRAVENOUS

## 2017-12-29 MED ORDER — SODIUM CHLORIDE 0.9 % IV SOLN
Freq: Once | INTRAVENOUS | Status: AC
  Administered 2017-12-29: 12:00:00 via INTRAVENOUS
  Filled 2017-12-29: qty 250

## 2017-12-29 MED ORDER — SODIUM CHLORIDE 0.9 % IV SOLN
375.0000 mg/m2 | Freq: Once | INTRAVENOUS | Status: AC
  Administered 2017-12-29: 600 mg via INTRAVENOUS
  Filled 2017-12-29: qty 50

## 2017-12-29 MED ORDER — DIPHENHYDRAMINE HCL 25 MG PO CAPS
50.0000 mg | ORAL_CAPSULE | Freq: Once | ORAL | Status: AC
  Administered 2017-12-29: 50 mg via ORAL

## 2017-12-29 MED ORDER — SODIUM CHLORIDE 0.9 % IV SOLN
12.0000 mg | Freq: Once | INTRAVENOUS | Status: AC
  Administered 2017-12-29: 12 mg via INTRAVENOUS
  Filled 2017-12-29: qty 1.2

## 2017-12-29 MED ORDER — ACETAMINOPHEN 500 MG PO TABS
1000.0000 mg | ORAL_TABLET | Freq: Once | ORAL | Status: AC
  Administered 2017-12-29: 1000 mg via ORAL

## 2017-12-29 MED ORDER — DIPHENHYDRAMINE HCL 25 MG PO CAPS
ORAL_CAPSULE | ORAL | Status: AC
  Filled 2017-12-29: qty 2

## 2017-12-29 MED ORDER — ACETAMINOPHEN 500 MG PO TABS
ORAL_TABLET | ORAL | Status: AC
  Filled 2017-12-29: qty 2

## 2017-12-29 MED ORDER — MEPERIDINE HCL 25 MG/ML IJ SOLN
INTRAMUSCULAR | Status: AC
  Filled 2017-12-29: qty 1

## 2017-12-29 NOTE — Patient Instructions (Signed)
[facility] Discharge Instructions for Patients Receiving Chemotherapy  Today you received the following chemotherapy agents Rituxan To help prevent nausea and vomiting after your treatment, we encourage you to take your nausea medication as prescribed.  If you develop nausea and vomiting that is not controlled by your nausea medication, call the clinic.   BELOW ARE SYMPTOMS THAT SHOULD BE REPORTED IMMEDIATELY:  *FEVER GREATER THAN 100.5 F  *CHILLS WITH OR WITHOUT FEVER  NAUSEA AND VOMITING THAT IS NOT CONTROLLED WITH YOUR NAUSEA MEDICATION  *UNUSUAL SHORTNESS OF BREATH  *UNUSUAL BRUISING OR BLEEDING  TENDERNESS IN MOUTH AND THROAT WITH OR WITHOUT PRESENCE OF ULCERS  *URINARY PROBLEMS  *BOWEL PROBLEMS  UNUSUAL RASH Items with * indicate a potential emergency and should be followed up as soon as possible.  Feel free to call the clinic you have any questions or concerns. The clinic phone number is [phone].  Please show the CHEMO ALERT CARD at check-in to the Emergency Department and triage nurse.   

## 2017-12-29 NOTE — Progress Notes (Signed)
Marland Kitchen    HEMATOLOGY/ONCOLOGY CLINIC NOTE  Date of Service: 12/29/2017   Patient Care Team: [name], MD as PCP - General (Family Medicine) [name], MD as PCP - Cardiology (Cardiology)  CHIEF COMPLAINTS/PURPOSE OF CONSULTATION:  F/u for splenic marginal zone lymphoma  HISTORY OF PRESENTING ILLNESS:   Kathleen Arias is a wonderful 84 y.o. female who has been referred to [place] by Dr .Redmond Pulling, Jama Flavors, MD for evaluation and management of anemia, leukocytosis and thrombocytopenia.  Patient is a very pleasant female who is here with her daughter. She has a history of hypertension, chronic systolic CHF [ejection fraction of 25-35% based on recent cardiac catheterization on 01/13/2016, echo done showed ejection fraction of 40-45%, nonischemic cardiomyopathy], iron deficiency,  Patient presented to his primary care physician with some dyspnea on exertion which is primarily related to her systolic CHF but she was also noted to have recent blood counts with her primary care physician on 03/15/2016 which showed anemia with a hemoglobin of 9.7 and a hematocrit of 79, platelet count of 137k , WBC count of 11.7k with 7.6k lymphocytes.  Iron studies were done which showed a ferritin of 84 and an iron saturation of 9%. She has been on oral iron replacement by her primary care physician and notes significant constipation and GI intolerance. She reports that she had a FOBT in 12/2015  which was negative.   Patient notes significant fatigue and dyspnea on exertion. Notes weight loss of about 15 pounds since September 2017. Weight is down from 142 to 124lbs.   Patient denies any enlarged lymph nodes, no night sweats.  INTERVAL HISTORY   Kathleen Arias is here for follow-up for management of splenic marginal zone lymphoma. The patient's last visit with [place] was on 12/22/17. She is accompanied today by her daughters. The pt reports that she is doing well overall.   The pt reports that she has continued to  tolerate Rituxan very well. She does not however that she is not functioning very well as she is feeling more tired in the last week. The pt denies any confusion at this time. She denies any fevers or chills.   Lab results today (12/29/17) of CBC w/diff and CMP is as follows: all values are WNL except for WBC at 27.5k, RBC at 2.92, HGB at 8.9, HCT at 27.3, RDW at 18.3, PLT at 91k, Calcium at 8.2, Albumin at 2.8. 12/29/17 Uric acid is at 5.0  On review of systems, pt reports feeling tired, and denies fevers, chills, concerns for infections, abdominal pains, and any other symptoms.   MEDICAL HISTORY:  Past Medical History:  Diagnosis Date  . Anemia   . Anginal pain ([place])   . Arthritis   . Cancer ([place])   . CHF (congestive heart failure) ([place])   . Dyspnea   . Dysrhythmia   . Hypertension   . Hypothyroidism   . Thyroid disease   Chronic systolic CHF Iron deficiency   SURGICAL HISTORY: Past Surgical History:  Procedure Laterality Date  . CARDIAC CATHETERIZATION N/A 01/07/2016   Procedure: Left Heart Cath and Coronary Angiography;  Surgeon: Jettie Booze, MD;  Location: [facility];  Service: Cardiovascular;  Laterality: N/A;  . CATARACT EXTRACTION, BILATERAL    . FRACTURE SURGERY    . HIP SURGERY    . JOINT REPLACEMENT    . TONSILLECTOMY      SOCIAL HISTORY: Social History   Socioeconomic History  . Marital status: Single  Spouse name: Not on file  . Number of children: Not on file  . Years of education: Not on file  . Highest education level: Not on file  Occupational History  . Not on file  Social Needs  . Financial resource strain: Not on file  . Food insecurity:    Worry: Not on file    Inability: Not on file  . Transportation needs:    Medical: Not on file    Non-medical: Not on file  Tobacco Use  . Smoking status: Former Research scientist (life sciences)  . Smokeless tobacco: Never Used  Substance and Sexual Activity  . Alcohol use: No  . Drug use: No  . Sexual  activity: Not on file  Lifestyle  . Physical activity:    Days per week: Not on file    Minutes per session: Not on file  . Stress: Not on file  Relationships  . Social connections:    Talks on phone: Not on file    Gets together: Not on file    Attends religious service: Not on file    Active member of club or organization: Not on file    Attends meetings of clubs or organizations: Not on file    Relationship status: Not on file  . Intimate partner violence:    Fear of current or ex partner: Not on file    Emotionally abused: Not on file    Physically abused: Not on file    Forced sexual activity: Not on file  Other Topics Concern  . Not on file  Social History Narrative  . Not on file    FAMILY HISTORY: Family History  Problem Relation Age of Onset  . Heart disease Mother     ALLERGIES:  has No Known Allergies.  MEDICATIONS:  Current Outpatient Medications  Medication Sig Dispense Refill  . B COMPLEX-C-CALCIUM PO Take 1 tablet by mouth daily.    . carvedilol (COREG) 6.25 MG tablet Take 6.25 mg by mouth 2 (two) times daily with a meal.     . iron polysaccharides (NIFEREX) 150 MG capsule Take 1 capsule (150 mg total) by mouth daily. 100 capsule 1  . levothyroxine (SYNTHROID, LEVOTHROID) 125 MCG tablet Take 1 tablet (125 mcg total) by mouth daily before breakfast. 30 tablet 0  . losartan (COZAAR) 25 MG tablet Take 1 tablet (25 mg total) by mouth daily. 90 tablet 1  . OVER THE COUNTER MEDICATION Take 2 tablets by mouth daily. VITAFUSION gummies    . potassium chloride SA (K-DUR,KLOR-CON) 20 MEQ tablet Take 1 tablet (20 mEq total) by mouth daily. 90 tablet 3  . torsemide (DEMADEX) 20 MG tablet Take 0.5 tablets (10 mg total) by mouth daily. 90 tablet 3  . Vitamin E 400 units TABS Take 800 Units by mouth daily.      No current facility-administered medications for this visit.     REVIEW OF SYSTEMS:    A 10+ POINT REVIEW OF SYSTEMS WAS OBTAINED including neurology,  dermatology, psychiatry, cardiac, respiratory, lymph, extremities, GI, GU, Musculoskeletal, constitutional, breasts, reproductive, HEENT.  All pertinent positives are noted in the HPI.  All others are negative.   PHYSICAL EXAMINATION:  ECOG PERFORMANCE STATUS: 2 - Symptomatic, <50% confined to bed  Vitals:   12/29/17 1029  BP: (!) 145/74  Pulse: 76  Resp: 18  Temp: 98.2 F (36.8 C)  TempSrc: Oral  SpO2: 94%  Height: 5\' 3"  (1.6 m)   Body mass index is 20.41 kg/m.  GENERAL:alert, in  no acute distress and comfortable SKIN: no acute rashes, no significant lesions EYES: conjunctiva are pink and non-injected, sclera anicteric OROPHARYNX: MMM, no exudates, no oropharyngeal erythema or ulceration NECK: supple, no JVD LYMPH:  no palpable lymphadenopathy in the cervical, axillary or inguinal regions LUNGS: clear to auscultation b/l with normal respiratory effort HEART: regular rate & rhythm ABDOMEN:  normoactive bowel sounds , tender over the spleen, not distended. Palpable splenomegaly.   Extremity: no pedal edema PSYCH: alert & oriented x 3 with fluent speech NEURO: no focal motor/sensory deficits   LABORATORY DATA:  I have reviewed the data as listed  . CBC Latest Ref Rng & Units 12/29/2017 12/22/2017 12/13/2017  WBC 4.0 - 10.5 K/uL 27.5(H) 31.7(H) 26.3(H)  Hemoglobin 12.0 - 15.0 g/dL 8.9(L) 9.0(L) 9.2(L)  Hematocrit 36.0 - 46.0 % 27.3(L) 28.8(L) 28.7(L)  Platelets 150 - 400 K/uL 91(L) 84(L) 76(L)    . CBC    Component Value Date/Time   WBC 27.5 (H) 12/29/2017 1003   RBC 2.92 (L) 12/29/2017 1003   HGB 8.9 (L) 12/29/2017 1003   HGB 9.4 (L) 10/11/2017 1439   HGB 11.4 (L) 09/27/2016 1251   HCT 27.3 (L) 12/29/2017 1003   HCT 28.5 (L) 10/11/2017 1439   HCT 32.7 (L) 09/27/2016 1251   PLT 91 (L) 12/29/2017 1003   PLT 198 05/30/2017 0843   PLT 223 09/27/2016 1251   PLT 118 (L) 05/04/2016 1435   MCV 93.5 12/29/2017 1003   MCV 84.9 09/27/2016 1251   MCH 30.5 12/29/2017 1003     MCHC 32.6 12/29/2017 1003   RDW 18.3 (H) 12/29/2017 1003   RDW 16.7 (H) 09/27/2016 1251   LYMPHSABS PENDING 12/29/2017 1003   LYMPHSABS 1.1 09/27/2016 1251   MONOABS PENDING 12/29/2017 1003   MONOABS 0.5 09/27/2016 1251   EOSABS PENDING 12/29/2017 1003   EOSABS 0.0 09/27/2016 1251   EOSABS 0.1 05/04/2016 1435   BASOSABS PENDING 12/29/2017 1003   BASOSABS 0.0 09/27/2016 1251    . CMP Latest Ref Rng & Units 12/29/2017 12/22/2017 12/13/2017  Glucose 70 - 99 mg/dL 85 85 89  BUN 8 - 23 mg/dL 15 17 15   Creatinine 0.44 - 1.00 mg/dL 0.78 0.83 0.81  Sodium 135 - 145 mmol/L 135 137 136  Potassium 3.5 - 5.1 mmol/L 4.0 4.1 4.2  Chloride 98 - 111 mmol/L 101 104 103  CO2 22 - 32 mmol/L 26 25 26   Calcium 8.9 - 10.3 mg/dL 8.2(L) 8.1(L) 8.3(L)  Total Protein 6.5 - 8.1 g/dL 8.0 7.9 8.3(H)  Total Bilirubin 0.3 - 1.2 mg/dL 0.8 1.0 1.1  Alkaline Phos 38 - 126 U/L 72 72 76  AST 15 - 41 U/L 27 32 35  ALT 0 - 44 U/L <6 <6 <6    Lab Results  Component Value Date   FERRITIN 220 12/13/2017   . Lab Results  Component Value Date   LDH 556 (H) 12/13/2017             12/22/17 Peripheral Blood Flow Cytometry:    RADIOGRAPHIC STUDIES: I have personally reviewed the radiological images as listed and agreed with the findings in the report. US Abdomen Complete  Result Date: 12/19/2017 CLINICAL DATA:  Splenic marginal zone B-cell lymphoma. Abdominal pain. EXAM: ABDOMEN ULTRASOUND COMPLETE COMPARISON:  CT scan of May 19, 2017. Ultrasound of September 20, 2016. PET scan of Jun 06, 2016. FINDINGS: Gallbladder: Gallbladder is not identified. Common bile duct: Diameter: 4 mm which is within normal limits. Liver: 19 mm  echogenic lesion is seen in the caudate lobe. Within normal limits in parenchymal echogenicity. Portal vein is patent on color Doppler imaging with normal direction of blood flow towards the liver. IVC: No abnormality visualized. Pancreas: Not well visualized due to overlying enlarged  spleen. Spleen: Severely enlarged with maximum measured diameter of 21 cm. Focal echogenic rounded mass lesion measuring 6 cm is noted consistent with necrosis as described on prior CT scan and PET scan. Right Kidney: Length: 7.7 cm. Echogenicity within normal limits. No mass or hydronephrosis visualized. Left Kidney: Not visualized due to enlarged spleen. Abdominal aorta: No aneurysm visualized. Other findings: None. IMPRESSION: Severe splenomegaly is again noted consistent with history of lymphoma. 6 cm rounded echogenic abnormality is noted medially within the spleen consistent with necrosis as described on prior CT and PET scans. 19 mm echogenic focus seen in caudate lobe of liver. This may represent hemangioma, but malignancy or metastatic disease cannot be excluded. MRI may be performed for further evaluation. Gallbladder is not visualized. Left kidney is not visualized due to overlying spleen. Electronically Signed   By: Marijo Conception, M.D.   On: 12/19/2017 16:15    ASSESSMENT & PLAN:   84 y.o. with   1) Splenic marginal zone lymphoma On presentation patient had anemia, lymphocytosis, thrombocytopenia and significant constitutional symptoms including fatigue, some low-grade fevers and night sweats. Also had abdominal discomfort related to her splenomegaly and anorexia with spleen of about 23cms on PET/CT. Serum kappa lambda free light chain ratio abnormally elevated. Myeloma panel no M spike IFE shows polyclonal gammopathy  Patient is status post Rituxan weekly 4 doses (06/27/16-07/22/16)  05/30/17 flow cytometry shows persistent SMZL clonal lymphocytes.   12/19/17 US Abdomen revealed Severe splenomegaly is again noted consistent with history of lymphoma. 6 cm rounded echogenic abnormality is noted medially within the spleen consistent with necrosis as described on prior CT and PET scans. 19 mm echogenic focus seen in caudate lobe of liver. This may represent hemangioma, but malignancy or  metastatic disease cannot be excluded. MRI may be performed for further evaluation. Gallbladder is not visualized. Left kidney is not visualized due to overlying spleen   2) Anemia and thrombocytopenia likely related to splenic marginal zone lymphoma and hypersplenism.  Thrombocytopenia improved PLT and lymphocytosis has remained resolved   Decreased splenomegaly significantly as per rpt [place] abd on 09/20/16. Stable as seen on 05/19/17 CT AP.   3) hypothyroidism- on levothyroxine  4) nonischemic systolic cardiomyopathy -  Cardiac catheterization December 2017- nonobstructive coronary artery disease. - EF decreased to 25-30% on 11/01/16 ECHO  5) Rigors and fever related to Rituxan vs Tumor lysis after 1st dose. Has not had any of these issues with adjusted pre-medications with the 2nd dose of Rituxan and is overall feeling much better.  6) Weakness and fatigue - likely multifactorial - CHF, iron deficiency, age, hypothyroidism. Less likely related primarily to [place] .  PLAN:   -Discussed pt labwork today, 12/29/17; WBC decreased to 27.5k, PLT increased to 91k, HGB stable at 8.9 -Due to patient's symptomatic anemia, will order PRBC which the pt prefers  -Discussed the 12/22/17 Peripheral Blood Flow Cytometry which revealed a monoclonal B Cell population consistent with patient's known Splenic marginal zone lymphoma -The pt has no prohibitive toxicities from continuing weekly Rituxan at this time. -Continue 150mg  Iron Polysaccharide po daily -Will plan for 4 weekly infusions of Rituxan, and possible consideration of maintenance Rituxan according to response -Will see the pt back in 2 weeks  6)  Bilateral ankle swelling with left ankle induration -Previous Doppler negative for DVT PLAN  -I previously recommended she continues to use compression socks, epsom salt bath and continue to RICE   Plz move MD appointment from 12/20 to 12/26 Move labs from 12/27 to 12/26 Continue to complete labs and  Rituxan 2 remaining doses  PRBC transfusion 1 unit on sat or mon    All of the patients and her accompanying family's questions were answered with apparent satisfaction. The patient knows to call the clinic with any problems, questions or concerns.  The total time spent in the appt was 25 minutes and more than 50% was on counseling and direct patient cares.      Sullivan Lone MD [place] AAHIVMS [facility] Hematology/Oncology Physician [facility]  (Office):       [phone] (Work cell):  [phone] (Fax):           [phone]  I, Baldwin [place], am acting as a scribe for Dr. Sullivan Lone.   .I have reviewed the above documentation for accuracy and completeness, and I agree with the above. Brunetta Genera MD

## 2018-01-01 ENCOUNTER — Other Ambulatory Visit: Payer: Self-pay | Admitting: *Deleted

## 2018-01-01 ENCOUNTER — Inpatient Hospital Stay: Payer: Medicare Other

## 2018-01-01 DIAGNOSIS — D649 Anemia, unspecified: Secondary | ICD-10-CM

## 2018-01-01 DIAGNOSIS — Z5111 Encounter for antineoplastic chemotherapy: Secondary | ICD-10-CM | POA: Diagnosis not present

## 2018-01-01 LAB — PREPARE RBC (CROSSMATCH)

## 2018-01-01 LAB — ABO/RH: ABO/RH(D): O NEG

## 2018-01-01 MED ORDER — ACETAMINOPHEN 325 MG PO TABS: 650.0000 mg | ORAL_TABLET | Freq: Once | ORAL | Status: DC

## 2018-01-01 MED ORDER — SODIUM CHLORIDE 0.9% IV SOLUTION
250.0000 mL | Freq: Once | INTRAVENOUS | Status: DC
  Filled 2018-01-01: qty 250

## 2018-01-01 MED ORDER — DIPHENHYDRAMINE HCL 25 MG PO TABS
25.0000 mg | ORAL_TABLET | Freq: Once | ORAL | Status: DC
  Filled 2018-01-01: qty 1

## 2018-01-01 NOTE — Patient Instructions (Signed)

## 2018-01-02 LAB — BPAM RBC
Blood Product Expiration Date: 201912232359
ISSUE DATE / TIME: 201912161518
Unit Type and Rh: 9500

## 2018-01-02 LAB — TYPE AND SCREEN
ABO/RH(D): O NEG
ANTIBODY SCREEN: NEGATIVE
Unit division: 0

## 2018-01-04 ENCOUNTER — Telehealth: Payer: Self-pay

## 2018-01-04 NOTE — Telephone Encounter (Signed)
Spoke with patient daughter concerning her upcoming appointment changes. Per 12/13 los moved lab, and [place] appointments to 12/26 as ordered. Will check on the additional appointment (1/3) to see if needed.

## 2018-01-05 ENCOUNTER — Inpatient Hospital Stay: Payer: Medicare Other

## 2018-01-05 ENCOUNTER — Inpatient Hospital Stay: Payer: Medicare Other | Admitting: Hematology

## 2018-01-05 VITALS — BP 137/59 | HR 71 | Temp 98.0°F | Resp 18

## 2018-01-05 DIAGNOSIS — C8307 Small cell B-cell lymphoma, spleen: Secondary | ICD-10-CM

## 2018-01-05 DIAGNOSIS — Z5111 Encounter for antineoplastic chemotherapy: Secondary | ICD-10-CM | POA: Diagnosis not present

## 2018-01-05 LAB — CMP (CANCER CENTER ONLY)
ALT: 6 U/L (ref 0–44)
AST: 26 U/L (ref 15–41)
Albumin: 2.8 g/dL — ABNORMAL LOW (ref 3.5–5.0)
Alkaline Phosphatase: 72 U/L (ref 38–126)
Anion gap: 4 — ABNORMAL LOW (ref 5–15)
BUN: 14 mg/dL (ref 8–23)
CO2: 26 mmol/L (ref 22–32)
Calcium: 7.9 mg/dL — ABNORMAL LOW (ref 8.9–10.3)
Chloride: 105 mmol/L (ref 98–111)
Creatinine: 0.74 mg/dL (ref 0.44–1.00)
GFR, Estimated: >60 mL/min (ref >60)
Glucose, Bld: 78 mg/dL (ref 70–99)
Potassium: 4.1 mmol/L (ref 3.5–5.1)
Sodium: 135 mmol/L (ref 135–145)
Total Bilirubin: 0.8 mg/dL (ref 0.3–1.2)
Total Protein: 7.8 g/dL (ref 6.5–8.1)

## 2018-01-05 LAB — CBC WITH DIFFERENTIAL/PLATELET
Abs Immature Granulocytes: 0 10*3/uL (ref 0.00–0.07)
Basophils Absolute: 0 10*3/uL (ref 0.0–0.1)
Basophils Relative: 0 %
Eosinophils Absolute: 0 10*3/uL (ref 0.0–0.5)
Eosinophils Relative: 0 %
HCT: 32.2 % — ABNORMAL LOW (ref 36.0–46.0)
Hemoglobin: 10.6 g/dL — ABNORMAL LOW (ref 12.0–15.0)
LYMPHS PCT: 72 %
Lymphs Abs: 14.4 10*3/uL — ABNORMAL HIGH (ref 0.7–4.0)
MCH: 30.5 pg (ref 26.0–34.0)
MCHC: 32.9 g/dL (ref 30.0–36.0)
MCV: 92.5 fL (ref 80.0–100.0)
Monocytes Absolute: 0.6 10*3/uL (ref 0.1–1.0)
Monocytes Relative: 3 %
Neutro Abs: 5 10*3/uL (ref 1.7–17.7)
Neutrophils Relative %: 25 %
Platelets: 74 10*3/uL — ABNORMAL LOW (ref 150–400)
RBC: 3.48 MIL/uL — ABNORMAL LOW (ref 3.87–5.11)
RDW: 17 % — ABNORMAL HIGH (ref 11.5–15.5)
WBC: 20 10*3/uL — ABNORMAL HIGH (ref 4.0–10.5)
nRBC: 0 % (ref 0.0–0.2)

## 2018-01-05 MED ORDER — DIPHENHYDRAMINE HCL 25 MG PO CAPS
ORAL_CAPSULE | ORAL | Status: AC
  Filled 2018-01-05: qty 2

## 2018-01-05 MED ORDER — DIPHENHYDRAMINE HCL 25 MG PO CAPS
50.0000 mg | ORAL_CAPSULE | Freq: Once | ORAL | Status: AC
  Administered 2018-01-05: 50 mg via ORAL

## 2018-01-05 MED ORDER — HEPARIN SOD (PORK) LOCK FLUSH 100 UNIT/ML IV SOLN
500.0000 [IU] | Freq: Once | INTRAVENOUS | Status: DC | PRN
  Filled 2018-01-05: qty 5

## 2018-01-05 MED ORDER — FAMOTIDINE 20 MG PO TABS
ORAL_TABLET | ORAL | Status: AC
  Filled 2018-01-05: qty 1

## 2018-01-05 MED ORDER — MEPERIDINE HCL 25 MG/ML IJ SOLN
INTRAMUSCULAR | Status: AC
  Filled 2018-01-05: qty 1

## 2018-01-05 MED ORDER — MEPERIDINE HCL 25 MG/ML IJ SOLN
12.5000 mg | Freq: Once | INTRAMUSCULAR | Status: AC
  Administered 2018-01-05: 12.5 mg via INTRAVENOUS

## 2018-01-05 MED ORDER — SODIUM CHLORIDE 0.9 % IV SOLN
Freq: Once | INTRAVENOUS | Status: AC
  Administered 2018-01-05: 13:00:00 via INTRAVENOUS
  Filled 2018-01-05: qty 250

## 2018-01-05 MED ORDER — FAMOTIDINE 20 MG PO TABS
20.0000 mg | ORAL_TABLET | Freq: Once | ORAL | Status: AC
  Administered 2018-01-05: 20 mg via ORAL

## 2018-01-05 MED ORDER — ACETAMINOPHEN 500 MG PO TABS
1000.0000 mg | ORAL_TABLET | Freq: Once | ORAL | Status: AC
  Administered 2018-01-05: 1000 mg via ORAL

## 2018-01-05 MED ORDER — ACETAMINOPHEN 500 MG PO TABS
ORAL_TABLET | ORAL | Status: AC
  Filled 2018-01-05: qty 2

## 2018-01-05 MED ORDER — SODIUM CHLORIDE 0.9 % IV SOLN
12.0000 mg | Freq: Once | INTRAVENOUS | Status: AC
  Administered 2018-01-05: 12 mg via INTRAVENOUS
  Filled 2018-01-05: qty 1.2

## 2018-01-05 MED ORDER — SODIUM CHLORIDE 0.9 % IV SOLN
375.0000 mg/m2 | Freq: Once | INTRAVENOUS | Status: AC
  Administered 2018-01-05: 600 mg via INTRAVENOUS
  Filled 2018-01-05: qty 50

## 2018-01-05 NOTE — Patient Instructions (Signed)
[facility] Discharge Instructions for Patients Receiving Chemotherapy  Today you received the following chemotherapy agents Rituxan To help prevent nausea and vomiting after your treatment, we encourage you to take your nausea medication as prescribed.  If you develop nausea and vomiting that is not controlled by your nausea medication, call the clinic.   BELOW ARE SYMPTOMS THAT SHOULD BE REPORTED IMMEDIATELY:  *FEVER GREATER THAN 100.5 F  *CHILLS WITH OR WITHOUT FEVER  NAUSEA AND VOMITING THAT IS NOT CONTROLLED WITH YOUR NAUSEA MEDICATION  *UNUSUAL SHORTNESS OF BREATH  *UNUSUAL BRUISING OR BLEEDING  TENDERNESS IN MOUTH AND THROAT WITH OR WITHOUT PRESENCE OF ULCERS  *URINARY PROBLEMS  *BOWEL PROBLEMS  UNUSUAL RASH Items with * indicate a potential emergency and should be followed up as soon as possible.  Feel free to call the clinic you have any questions or concerns. The clinic phone number is [phone].  Please show the CHEMO ALERT CARD at check-in to the Emergency Department and triage nurse.   

## 2018-01-11 ENCOUNTER — Inpatient Hospital Stay: Payer: Medicare Other

## 2018-01-11 ENCOUNTER — Telehealth: Payer: Self-pay | Admitting: Hematology

## 2018-01-11 ENCOUNTER — Inpatient Hospital Stay (HOSPITAL_BASED_OUTPATIENT_CLINIC_OR_DEPARTMENT_OTHER): Payer: Medicare Other | Admitting: Hematology

## 2018-01-11 VITALS — BP 139/67 | HR 68 | Temp 97.7°F | Resp 18 | Ht 63.0 in

## 2018-01-11 DIAGNOSIS — C8307 Small cell B-cell lymphoma, spleen: Secondary | ICD-10-CM | POA: Diagnosis not present

## 2018-01-11 DIAGNOSIS — R161 Splenomegaly, not elsewhere classified: Secondary | ICD-10-CM | POA: Diagnosis not present

## 2018-01-11 DIAGNOSIS — D72820 Lymphocytosis (symptomatic): Secondary | ICD-10-CM

## 2018-01-11 DIAGNOSIS — D649 Anemia, unspecified: Secondary | ICD-10-CM | POA: Diagnosis not present

## 2018-01-11 DIAGNOSIS — Z5111 Encounter for antineoplastic chemotherapy: Secondary | ICD-10-CM | POA: Diagnosis not present

## 2018-01-11 DIAGNOSIS — D696 Thrombocytopenia, unspecified: Secondary | ICD-10-CM

## 2018-01-11 DIAGNOSIS — E039 Hypothyroidism, unspecified: Secondary | ICD-10-CM

## 2018-01-11 DIAGNOSIS — Z79899 Other long term (current) drug therapy: Secondary | ICD-10-CM

## 2018-01-11 LAB — CBC WITH DIFFERENTIAL/PLATELET
Abs Immature Granulocytes: 0 10*3/uL (ref 0.00–0.07)
BASOS PCT: 0 %
Band Neutrophils: 1 %
Basophils Absolute: 0 10*3/uL (ref 0.0–0.1)
Eosinophils Absolute: 0.1 10*3/uL (ref 0.0–0.5)
Eosinophils Relative: 1 %
HCT: 31.9 % — ABNORMAL LOW (ref 36.0–46.0)
Hemoglobin: 10.6 g/dL — ABNORMAL LOW (ref 12.0–15.0)
Lymphocytes Relative: 63 %
Lymphs Abs: 8.3 10*3/uL — ABNORMAL HIGH (ref 0.7–4.0)
MCH: 30 pg (ref 26.0–34.0)
MCHC: 33.2 g/dL (ref 30.0–36.0)
MCV: 90.4 fL (ref 80.0–100.0)
Monocytes Absolute: 0.9 10*3/uL (ref 0.1–1.0)
Monocytes Relative: 7 %
NRBC: 0 % (ref 0.0–0.2)
Neutro Abs: 3.8 10*3/uL (ref 1.7–17.7)
Neutrophils Relative %: 28 %
Platelets: 73 10*3/uL — ABNORMAL LOW (ref 150–400)
RBC: 3.53 MIL/uL — ABNORMAL LOW (ref 3.87–5.11)
RDW: 16.1 % — ABNORMAL HIGH (ref 11.5–15.5)
WBC: 13.2 10*3/uL — ABNORMAL HIGH (ref 4.0–10.5)

## 2018-01-11 LAB — CMP (CANCER CENTER ONLY)
ALT: 7 U/L (ref 0–44)
ANION GAP: 5 (ref 5–15)
AST: 22 U/L (ref 15–41)
Albumin: 2.8 g/dL — ABNORMAL LOW (ref 3.5–5.0)
Alkaline Phosphatase: 76 U/L (ref 38–126)
BUN: 19 mg/dL (ref 8–23)
CO2: 27 mmol/L (ref 22–32)
Calcium: 8 mg/dL — ABNORMAL LOW (ref 8.9–10.3)
Chloride: 103 mmol/L (ref 98–111)
Creatinine: 0.76 mg/dL (ref 0.44–1.00)
GFR, Estimated: >60 mL/min (ref >60)
Glucose, Bld: 88 mg/dL (ref 70–99)
Potassium: 4 mmol/L (ref 3.5–5.1)
SODIUM: 135 mmol/L (ref 135–145)
Total Bilirubin: 0.5 mg/dL (ref 0.3–1.2)
Total Protein: 7.7 g/dL (ref 6.5–8.1)

## 2018-01-11 NOTE — Progress Notes (Signed)
Marland Kitchen    HEMATOLOGY/ONCOLOGY CLINIC NOTE  Date of Service: 01/11/2018   Patient Care Team: [name], MD as PCP - General (Family Medicine) [name], MD as PCP - Cardiology (Cardiology)  CHIEF COMPLAINTS/PURPOSE OF CONSULTATION:  F/u for splenic marginal zone lymphoma  HISTORY OF PRESENTING ILLNESS:   Kathleen Arias is a wonderful 84 y.o. female who has been referred to [place] by Dr .Redmond Pulling, Jama Flavors, MD for evaluation and management of anemia, leukocytosis and thrombocytopenia.  Patient is a very pleasant female who is here with her daughter. She has a history of hypertension, chronic systolic CHF [ejection fraction of 25-35% based on recent cardiac catheterization on 01/13/2016, echo done showed ejection fraction of 40-45%, nonischemic cardiomyopathy], iron deficiency,  Patient presented to his primary care physician with some dyspnea on exertion which is primarily related to her systolic CHF but she was also noted to have recent blood counts with her primary care physician on 03/15/2016 which showed anemia with a hemoglobin of 9.7 and a hematocrit of 79, platelet count of 137k , WBC count of 11.7k with 7.6k lymphocytes.  Iron studies were done which showed a ferritin of 84 and an iron saturation of 9%. She has been on oral iron replacement by her primary care physician and notes significant constipation and GI intolerance. She reports that she had a FOBT in 12/2015  which was negative.   Patient notes significant fatigue and dyspnea on exertion. Notes weight loss of about 15 pounds since September 2017. Weight is down from 142 to 124lbs.   Patient denies any enlarged lymph nodes, no night sweats.  INTERVAL HISTORY   Kathleen Arias is here for follow-up for management of splenic marginal zone lymphoma. The patient's last visit with [place] was on 12/29/17. She is accompanied today by her daughter. The pt reports that she is doing well overall. She notes she has been tolerating Rituxan well  overall. She notes she is able to get around better eat much better. She does not change in her spleen. She notes having good family support. She notes she completed her prednisone.    On review of symptoms, pt notes feeling mildly better but still weak. She denies fever, chills or night sweats or abdominal pain. Denies rigors from Rituxan. She does have sensitivity in her b/l legs.     MEDICAL HISTORY:  Past Medical History:  Diagnosis Date  . Anemia   . Anginal pain ([place])   . Arthritis   . Cancer ([place])   . CHF (congestive heart failure) ([place])   . Dyspnea   . Dysrhythmia   . Hypertension   . Hypothyroidism   . Thyroid disease   Chronic systolic CHF Iron deficiency   SURGICAL HISTORY: Past Surgical History:  Procedure Laterality Date  . CARDIAC CATHETERIZATION N/A 01/07/2016   Procedure: Left Heart Cath and Coronary Angiography;  Surgeon: Jettie Booze, MD;  Location: [facility];  Service: Cardiovascular;  Laterality: N/A;  . CATARACT EXTRACTION, BILATERAL    . FRACTURE SURGERY    . HIP SURGERY    . JOINT REPLACEMENT    . TONSILLECTOMY      SOCIAL HISTORY: Social History   Socioeconomic History  . Marital status: Single    Spouse name: Not on file  . Number of children: Not on file  . Years of education: Not on file  . Highest education level: Not on file  Occupational History  . Not on file  Social Needs  .  Financial resource strain: Not on file  . Food insecurity:    Worry: Not on file    Inability: Not on file  . Transportation needs:    Medical: Not on file    Non-medical: Not on file  Tobacco Use  . Smoking status: Former Research scientist (life sciences)  . Smokeless tobacco: Never Used  Substance and Sexual Activity  . Alcohol use: No  . Drug use: No  . Sexual activity: Not on file  Lifestyle  . Physical activity:    Days per week: Not on file    Minutes per session: Not on file  . Stress: Not on file  Relationships  . Social connections:    Talks on  phone: Not on file    Gets together: Not on file    Attends religious service: Not on file    Active member of club or organization: Not on file    Attends meetings of clubs or organizations: Not on file    Relationship status: Not on file  . Intimate partner violence:    Fear of current or ex partner: Not on file    Emotionally abused: Not on file    Physically abused: Not on file    Forced sexual activity: Not on file  Other Topics Concern  . Not on file  Social History Narrative  . Not on file    FAMILY HISTORY: Family History  Problem Relation Age of Onset  . Heart disease Mother     ALLERGIES:  has No Known Allergies.  MEDICATIONS:  Current Outpatient Medications  Medication Sig Dispense Refill  . B COMPLEX-C-CALCIUM PO Take 1 tablet by mouth daily.    . carvedilol (COREG) 6.25 MG tablet Take 6.25 mg by mouth 2 (two) times daily with a meal.     . iron polysaccharides (NIFEREX) 150 MG capsule Take 1 capsule (150 mg total) by mouth daily. 100 capsule 1  . levothyroxine (SYNTHROID, LEVOTHROID) 125 MCG tablet Take 1 tablet (125 mcg total) by mouth daily before breakfast. 30 tablet 0  . losartan (COZAAR) 25 MG tablet Take 1 tablet (25 mg total) by mouth daily. 90 tablet 1  . OVER THE COUNTER MEDICATION Take 2 tablets by mouth daily. VITAFUSION gummies    . potassium chloride SA (K-DUR,KLOR-CON) 20 MEQ tablet Take 1 tablet (20 mEq total) by mouth daily. 90 tablet 3  . torsemide (DEMADEX) 20 MG tablet Take 0.5 tablets (10 mg total) by mouth daily. 90 tablet 3  . Vitamin E 400 units TABS Take 800 Units by mouth daily.      No current facility-administered medications for this visit.     REVIEW OF SYSTEMS:    A 10+ POINT REVIEW OF SYSTEMS WAS OBTAINED including neurology, dermatology, psychiatry, cardiac, respiratory, lymph, extremities, GI, GU, Musculoskeletal, constitutional, breasts, reproductive, HEENT.  All pertinent positives are noted in the HPI.  All others are  negative.   PHYSICAL EXAMINATION:  ECOG PERFORMANCE STATUS: 2 - Symptomatic, <50% confined to bed  Vitals:   01/11/18 1030  BP: 139/67  Pulse: 68  Resp: 18  Temp: 97.7 F (36.5 C)  TempSrc: Oral  SpO2: 95%  Height: 5\' 3"  (1.6 m)   Body mass index is 20.41 kg/m.  GENERAL:alert, in no acute distress and comfortable SKIN: no acute rashes, no significant lesions EYES: conjunctiva are pink and non-injected, sclera anicteric OROPHARYNX: MMM, no exudates, no oropharyngeal erythema or ulceration NECK: supple, no JVD LYMPH:  no palpable lymphadenopathy in the cervical, axillary or  inguinal regions LUNGS: clear to auscultation b/l with normal respiratory effort HEART: regular rate & rhythm ABDOMEN:  normoactive bowel sounds , tender over the spleen, not distended. Palpable splenomegaly.   Extremity: no pedal edema PSYCH: alert & oriented x 3 with fluent speech NEURO: no focal motor/sensory deficits   Exam done in wheelchair today   LABORATORY DATA:  I have reviewed the data as listed  . CBC Latest Ref Rng & Units 01/11/2018 01/05/2018 12/29/2017  WBC 4.0 - 10.5 K/uL 13.2(H) 20.0(H) 27.5(H)  Hemoglobin 12.0 - 15.0 g/dL 10.6(L) 10.6(L) 8.9(L)  Hematocrit 36.0 - 46.0 % 31.9(L) 32.2(L) 27.3(L)  Platelets 150 - 400 K/uL 73(L) 74(L) 91(L)    . CBC    Component Value Date/Time   WBC 13.2 (H) 01/11/2018 1011   RBC 3.53 (L) 01/11/2018 1011   HGB 10.6 (L) 01/11/2018 1011   HGB 9.4 (L) 10/11/2017 1439   HGB 11.4 (L) 09/27/2016 1251   HCT 31.9 (L) 01/11/2018 1011   HCT 28.5 (L) 10/11/2017 1439   HCT 32.7 (L) 09/27/2016 1251   PLT 73 (L) 01/11/2018 1011   PLT 198 05/30/2017 0843   PLT 223 09/27/2016 1251   PLT 118 (L) 05/04/2016 1435   MCV 90.4 01/11/2018 1011   MCV 84.9 09/27/2016 1251   MCH 30.0 01/11/2018 1011   MCHC 33.2 01/11/2018 1011   RDW 16.1 (H) 01/11/2018 1011   RDW 16.7 (H) 09/27/2016 1251   LYMPHSABS PENDING 01/11/2018 1011   LYMPHSABS 1.1 09/27/2016 1251    MONOABS PENDING 01/11/2018 1011   MONOABS 0.5 09/27/2016 1251   EOSABS PENDING 01/11/2018 1011   EOSABS 0.0 09/27/2016 1251   EOSABS 0.1 05/04/2016 1435   BASOSABS PENDING 01/11/2018 1011   BASOSABS 0.0 09/27/2016 1251    . CMP Latest Ref Rng & Units 01/05/2018 12/29/2017 12/22/2017  Glucose 70 - 99 mg/dL 78 85 85  BUN 8 - 23 mg/dL 14 15 17   Creatinine 0.44 - 1.00 mg/dL 0.74 0.78 0.83  Sodium 135 - 145 mmol/L 135 135 137  Potassium 3.5 - 5.1 mmol/L 4.1 4.0 4.1  Chloride 98 - 111 mmol/L 105 101 104  CO2 22 - 32 mmol/L 26 26 25   Calcium 8.9 - 10.3 mg/dL 7.9(L) 8.2(L) 8.1(L)  Total Protein 6.5 - 8.1 g/dL 7.8 8.0 7.9  Total Bilirubin 0.3 - 1.2 mg/dL 0.8 0.8 1.0  Alkaline Phos 38 - 126 U/L 72 72 72  AST 15 - 41 U/L 26 27 32  ALT 0 - 44 U/L 6 <6 <6    Lab Results  Component Value Date   FERRITIN 220 12/13/2017   .         12/22/17 Peripheral Blood Flow Cytometry:    RADIOGRAPHIC STUDIES: I have personally reviewed the radiological images as listed and agreed with the findings in the report. US Abdomen Complete  Result Date: 12/19/2017 CLINICAL DATA:  Splenic marginal zone B-cell lymphoma. Abdominal pain. EXAM: ABDOMEN ULTRASOUND COMPLETE COMPARISON:  CT scan of May 19, 2017. Ultrasound of September 20, 2016. PET scan of Jun 06, 2016. FINDINGS: Gallbladder: Gallbladder is not identified. Common bile duct: Diameter: 4 mm which is within normal limits. Liver: 19 mm echogenic lesion is seen in the caudate lobe. Within normal limits in parenchymal echogenicity. Portal vein is patent on color Doppler imaging with normal direction of blood flow towards the liver. IVC: No abnormality visualized. Pancreas: Not well visualized due to overlying enlarged spleen. Spleen: Severely enlarged with maximum measured diameter of 21  cm. Focal echogenic rounded mass lesion measuring 6 cm is noted consistent with necrosis as described on prior CT scan and PET scan. Right Kidney: Length: 7.7 cm.  Echogenicity within normal limits. No mass or hydronephrosis visualized. Left Kidney: Not visualized due to enlarged spleen. Abdominal aorta: No aneurysm visualized. Other findings: None. IMPRESSION: Severe splenomegaly is again noted consistent with history of lymphoma. 6 cm rounded echogenic abnormality is noted medially within the spleen consistent with necrosis as described on prior CT and PET scans. 19 mm echogenic focus seen in caudate lobe of liver. This may represent hemangioma, but malignancy or metastatic disease cannot be excluded. MRI may be performed for further evaluation. Gallbladder is not visualized. Left kidney is not visualized due to overlying spleen. Electronically Signed   By: Marijo Conception, M.D.   On: 12/19/2017 16:15    ASSESSMENT & PLAN:   84 y.o. with   1) Splenic marginal zone lymphoma On presentation patient had anemia, lymphocytosis, thrombocytopenia and significant constitutional symptoms including fatigue, some low-grade fevers and night sweats. Also had abdominal discomfort related to her splenomegaly and anorexia with spleen of about 23cms on PET/CT. Serum kappa lambda free light chain ratio abnormally elevated. Myeloma panel no M spike IFE shows polyclonal gammopathy  Patient is status post Rituxan weekly 4 doses (06/27/16-07/22/16)  05/30/17 flow cytometry shows persistent SMZL clonal lymphocytes.   12/19/17 US Abdomen revealed Severe splenomegaly is again noted consistent with history of lymphoma. 6 cm rounded echogenic abnormality is noted medially within the spleen consistent with necrosis as described on prior CT and PET scans. 19 mm echogenic focus seen in caudate lobe of liver. This may represent hemangioma, but malignancy or metastatic disease cannot be excluded. MRI may be performed for further evaluation. Gallbladder is not visualized. Left kidney is not visualized due to overlying spleen   12/22/17 Peripheral Blood Flow Cytometry which revealed a monoclonal  B Cell population consistent with patient's known Splenic marginal zone lymphoma  Rituxan weekly x4 (12/22/17-01/12/18).   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  Thrombocytopenia improved PLT and lymphocytosis has remained resolved   Decreased splenomegaly significantly as per rpt [place] abd on 09/20/16. Stable as seen on 05/19/17 CT AP.   3) hypothyroidism- on levothyroxine  4) nonischemic systolic cardiomyopathy -  Cardiac catheterization December 2017- nonobstructive coronary artery disease. - EF decreased to 25-30% on 11/01/16 ECHO  5) Rigors and fever related to Rituxan vs Tumor lysis after 1st dose. Has not had any of these issues with adjusted pre-medications with the 2nd dose of Rituxan and is overall feeling much better.  6) Weakness and fatigue - likely multifactorial - CHF, iron deficiency, age, hypothyroidism. Less likely related primarily to [place] .  PLAN:  -Discussed pt labwork today, 01/11/18: WBC decreased to 13.2, Hg stable at 10.6, PLT at 73K. Blood chemistries are still pending.  -The pt has no prohibitive toxicities from continuing weekly Rituxan at this time. -Continue 150mg  Iron Polysaccharide po daily. No need for blood transfusion today.  -Plan to complete last and 4th cycle of Rituxan tomorrow. She has been tolerating well. Will consider maintenance Rituxan q2-20months according to response -Plan for next CT in 3-4 months to monitor splenomegaly.  -Will see the pt back in 6 weeks.   6) Bilateral ankle swelling with left ankle induration  -Previous Doppler negative for DVT PLAN  -Continues to use compression socks, epsom salt bath and continue to RICE -Stable    F/u  for 4th cycle of Rituxan as scheduled on 12/27 Plz cancel all appointments on 01/19/2018 for labs/MD visit/Rituxan infusion RTC with Dr [name] with labs in 6 weeks    All of the patients and her accompanying family's questions were answered with  apparent satisfaction. The patient knows to call the clinic with any problems, questions or concerns.  The total time spent in the appt was 25 minutes and more than 50% was on counseling and direct patient cares.      Sullivan Lone MD MS AAHIVMS [facility] Hematology/Oncology Physician [facility]  (Office):       [phone] (Work cell):  [phone] (Fax):           [phone]  I, Joslyn Devon, am acting as scribe for Sullivan Lone, MD.   .I have reviewed the above documentation for accuracy and completeness, and I agree with the above.    Brunetta Genera MD

## 2018-01-11 NOTE — Telephone Encounter (Signed)
Printed calendar and avs. °

## 2018-01-12 ENCOUNTER — Inpatient Hospital Stay: Payer: Medicare Other

## 2018-01-12 ENCOUNTER — Other Ambulatory Visit: Payer: Medicare Other

## 2018-01-12 VITALS — BP 138/63 | HR 71 | Temp 97.6°F | Resp 16

## 2018-01-12 DIAGNOSIS — Z5111 Encounter for antineoplastic chemotherapy: Secondary | ICD-10-CM | POA: Diagnosis not present

## 2018-01-12 DIAGNOSIS — C8307 Small cell B-cell lymphoma, spleen: Secondary | ICD-10-CM

## 2018-01-12 MED ORDER — FAMOTIDINE 20 MG PO TABS
20.0000 mg | ORAL_TABLET | Freq: Once | ORAL | Status: AC
  Administered 2018-01-12: 20 mg via ORAL

## 2018-01-12 MED ORDER — ACETAMINOPHEN 500 MG PO TABS
1000.0000 mg | ORAL_TABLET | Freq: Once | ORAL | Status: AC
  Administered 2018-01-12: 1000 mg via ORAL

## 2018-01-12 MED ORDER — ACETAMINOPHEN 500 MG PO TABS
ORAL_TABLET | ORAL | Status: AC
  Filled 2018-01-12: qty 2

## 2018-01-12 MED ORDER — DIPHENHYDRAMINE HCL 25 MG PO CAPS
50.0000 mg | ORAL_CAPSULE | Freq: Once | ORAL | Status: AC
  Administered 2018-01-12: 50 mg via ORAL

## 2018-01-12 MED ORDER — DIPHENHYDRAMINE HCL 25 MG PO CAPS
ORAL_CAPSULE | ORAL | Status: AC
  Filled 2018-01-12: qty 2

## 2018-01-12 MED ORDER — SODIUM CHLORIDE 0.9 % IV SOLN
375.0000 mg/m2 | Freq: Once | INTRAVENOUS | Status: AC
  Administered 2018-01-12: 600 mg via INTRAVENOUS
  Filled 2018-01-12: qty 10

## 2018-01-12 MED ORDER — FAMOTIDINE 20 MG PO TABS
ORAL_TABLET | ORAL | Status: AC
  Filled 2018-01-12: qty 1

## 2018-01-12 MED ORDER — MEPERIDINE HCL 25 MG/ML IJ SOLN
12.5000 mg | Freq: Once | INTRAMUSCULAR | Status: AC
  Administered 2018-01-12: 12.5 mg via INTRAVENOUS

## 2018-01-12 MED ORDER — SODIUM CHLORIDE 0.9 % IV SOLN
12.0000 mg | Freq: Once | INTRAVENOUS | Status: AC
  Administered 2018-01-12: 12 mg via INTRAVENOUS
  Filled 2018-01-12: qty 1.2

## 2018-01-12 MED ORDER — SODIUM CHLORIDE 0.9 % IV SOLN
Freq: Once | INTRAVENOUS | Status: AC
  Administered 2018-01-12: 14:00:00 via INTRAVENOUS
  Filled 2018-01-12: qty 250

## 2018-01-12 MED ORDER — MEPERIDINE HCL 25 MG/ML IJ SOLN
INTRAMUSCULAR | Status: AC
  Filled 2018-01-12: qty 1

## 2018-01-12 NOTE — Patient Instructions (Signed)
[facility] Discharge Instructions for Patients Receiving Chemotherapy  Today you received the following chemotherapy agents: Rituxumab (Rituxan)  To help prevent nausea and vomiting after your treatment, we encourage you to take your nausea medication as directed.    If you develop nausea and vomiting that is not controlled by your nausea medication, call the clinic.   BELOW ARE SYMPTOMS THAT SHOULD BE REPORTED IMMEDIATELY:  *FEVER GREATER THAN 100.5 F  *CHILLS WITH OR WITHOUT FEVER  NAUSEA AND VOMITING THAT IS NOT CONTROLLED WITH YOUR NAUSEA MEDICATION  *UNUSUAL SHORTNESS OF BREATH  *UNUSUAL BRUISING OR BLEEDING  TENDERNESS IN MOUTH AND THROAT WITH OR WITHOUT PRESENCE OF ULCERS  *URINARY PROBLEMS  *BOWEL PROBLEMS  UNUSUAL RASH Items with * indicate a potential emergency and should be followed up as soon as possible.  Feel free to call the clinic should you have any questions or concerns. The clinic phone number is [phone].  Please show the CHEMO ALERT CARD at check-in to the Emergency Department and triage nurse.   

## 2018-01-19 ENCOUNTER — Other Ambulatory Visit: Payer: Medicare Other

## 2018-01-19 ENCOUNTER — Ambulatory Visit: Payer: Medicare Other

## 2018-01-19 ENCOUNTER — Ambulatory Visit: Payer: Medicare Other | Admitting: Hematology

## 2018-02-05 ENCOUNTER — Telehealth: Payer: Self-pay | Admitting: *Deleted

## 2018-02-05 NOTE — Telephone Encounter (Signed)
Patient daughter [name] left voice mail: Has patient had thyroid testing done here?If so, can they get results so she doesn't have to have it repeated. Her thyroid medicine needs to be filled and they will give the one from here to the doctor, if there is one. Attempted to contact daughter: Left voice mail on named voice mail. No thyroid lab studies completed here or ordered by Dr. [name].

## 2018-02-22 ENCOUNTER — Inpatient Hospital Stay: Payer: Medicare Other | Admitting: Hematology

## 2018-02-22 ENCOUNTER — Inpatient Hospital Stay: Payer: Medicare Other

## 2018-02-22 ENCOUNTER — Telehealth: Payer: Self-pay | Admitting: Hematology

## 2018-02-22 NOTE — Telephone Encounter (Signed)
Called daughter no answer - called pt and patient says to call daughter unable to leave a message the second time I called daughter. Pt says she will also let daughter know to call back

## 2018-03-01 ENCOUNTER — Other Ambulatory Visit: Payer: Self-pay

## 2018-03-01 ENCOUNTER — Inpatient Hospital Stay (HOSPITAL_COMMUNITY)
Admission: EM | Admit: 2018-03-01 | Discharge: 2018-03-06 | DRG: 644 | Disposition: A | Discharge status: Skilled Nursing Facility | Payer: Medicare Other | Attending: Internal Medicine | Admitting: Internal Medicine

## 2018-03-01 ENCOUNTER — Encounter (HOSPITAL_COMMUNITY): Payer: Self-pay

## 2018-03-01 ENCOUNTER — Emergency Department (HOSPITAL_COMMUNITY): Payer: Medicare Other

## 2018-03-01 DIAGNOSIS — N390 Urinary tract infection, site not specified: Secondary | ICD-10-CM | POA: Diagnosis present

## 2018-03-01 DIAGNOSIS — Z79899 Other long term (current) drug therapy: Secondary | ICD-10-CM

## 2018-03-01 DIAGNOSIS — E039 Hypothyroidism, unspecified: Secondary | ICD-10-CM | POA: Diagnosis not present

## 2018-03-01 DIAGNOSIS — I5022 Chronic systolic (congestive) heart failure: Secondary | ICD-10-CM | POA: Diagnosis present

## 2018-03-01 DIAGNOSIS — I428 Other cardiomyopathies: Secondary | ICD-10-CM | POA: Diagnosis present

## 2018-03-01 DIAGNOSIS — R0600 Dyspnea, unspecified: Secondary | ICD-10-CM

## 2018-03-01 DIAGNOSIS — Z8249 Family history of ischemic heart disease and other diseases of the circulatory system: Secondary | ICD-10-CM

## 2018-03-01 DIAGNOSIS — Z66 Do not resuscitate: Secondary | ICD-10-CM | POA: Diagnosis present

## 2018-03-01 DIAGNOSIS — B9620 Unspecified Escherichia coli [E. coli] as the cause of diseases classified elsewhere: Secondary | ICD-10-CM | POA: Diagnosis present

## 2018-03-01 DIAGNOSIS — Z9119 Patient's noncompliance with other medical treatment and regimen: Secondary | ICD-10-CM

## 2018-03-01 DIAGNOSIS — R531 Weakness: Secondary | ICD-10-CM | POA: Diagnosis not present

## 2018-03-01 DIAGNOSIS — Z87891 Personal history of nicotine dependence: Secondary | ICD-10-CM

## 2018-03-01 DIAGNOSIS — Z79890 Hormone replacement therapy (postmenopausal): Secondary | ICD-10-CM

## 2018-03-01 DIAGNOSIS — D509 Iron deficiency anemia, unspecified: Secondary | ICD-10-CM

## 2018-03-01 DIAGNOSIS — I110 Hypertensive heart disease with heart failure: Secondary | ICD-10-CM | POA: Diagnosis present

## 2018-03-01 DIAGNOSIS — C8307 Small cell B-cell lymphoma, spleen: Secondary | ICD-10-CM | POA: Diagnosis present

## 2018-03-01 DIAGNOSIS — C8510 Unspecified B-cell lymphoma, unspecified site: Secondary | ICD-10-CM | POA: Diagnosis present

## 2018-03-01 LAB — CBC
HCT: 26 % — ABNORMAL LOW (ref 36.0–46.0)
HEMOGLOBIN: 8.2 g/dL — AB (ref 12.0–15.0)
MCH: 29.5 pg (ref 26.0–34.0)
MCHC: 31.5 g/dL (ref 30.0–36.0)
MCV: 93.5 fL (ref 80.0–100.0)
Platelets: 95 10*3/uL — ABNORMAL LOW (ref 150–400)
RBC: 2.78 MIL/uL — ABNORMAL LOW (ref 3.87–5.11)
RDW: 17.5 % — ABNORMAL HIGH (ref 11.5–15.5)
WBC: 11.4 10*3/uL — ABNORMAL HIGH (ref 4.0–10.5)
nRBC: 0.2 % (ref 0.0–0.2)

## 2018-03-01 LAB — BASIC METABOLIC PANEL
Anion gap: 14 (ref 5–15)
BUN: 18 mg/dL (ref 8–23)
CO2: 24 mmol/L (ref 22–32)
Calcium: 8.6 mg/dL — ABNORMAL LOW (ref 8.9–10.3)
Chloride: 98 mmol/L (ref 98–111)
Creatinine, Ser: 1.13 mg/dL — ABNORMAL HIGH (ref 0.44–1.00)
GFR calc Af Amer: 50 mL/min — ABNORMAL LOW (ref >60)
GFR calc non Af Amer: 43 mL/min — ABNORMAL LOW (ref >60)
Glucose, Bld: 125 mg/dL — ABNORMAL HIGH (ref 70–99)
Potassium: 3.6 mmol/L (ref 3.5–5.1)
Sodium: 136 mmol/L (ref 135–145)

## 2018-03-01 LAB — I-STAT TROPONIN, ED: Troponin i, poc: 0.02 ng/mL (ref 0.00–0.08)

## 2018-03-01 MED ORDER — SODIUM CHLORIDE 0.9% FLUSH: 3.0000 mL | Freq: Once | INTRAVENOUS | Status: DC

## 2018-03-01 NOTE — ED Provider Notes (Signed)
[facility] EMERGENCY DEPARTMENT Provider Note   CSN: [id number] Arrival date & time: 03/01/18  2115     History   Chief Complaint Chief Complaint  Patient presents with  . Weakness    HPI Kathleen Arias is a 85 y.o. female.  The history is provided by the patient and medical records.     85 y.o. F with hx of Fe+ deficiency anemia, arthritis, CHF with EF of 20-25%, hypertension, hypothyroidism, presenting to the ED for generalized weakness.  This has been an ongoing issues for the past several weeks, acutely worse over the past 48-72 hours.  Patient has started noticing some lightheadedness when she tries to get up and walk.  States when she stands up she will stagger for just a minute.  She has not had any falls or syncopal events.  No focal weakness, room spinning dizziness, numbness, changes in speech, confusion, or headaches.  She does continue ambulating with her walker.  Patient primarily lives alone but daughter has been with her all day today monitoring her closely.  States she has seemed a little more detail than normal lately.  She is also had some worsening shortness of breath over the past week.  Has had some slight increase in ankle edema but no change in orthopnea.  She does get very winded with any type of exertional activity, even walking across the house which she usually does without issue.  Has been compliant with her demadex. Patient is followed by Dr. [name] with oncology for her lymphoma.  She just finished course of prednisone to try to get her strength back up before resuming her Rituxan injections.  She did also receive a blood transfusion in December as well as a iron transfusion in December.  She continues her oral iron supplements.  She has not noticed any dark, tarry, or bloody stools.  No prior issues with GI bleeding.  She is not currently on anticoagulation.  Past Medical History:  Diagnosis Date  . Anemia   . Anginal pain ([place])   . Arthritis    . Cancer ([place])   . CHF (congestive heart failure) ([place])   . Dyspnea   . Dysrhythmia   . Hypertension   . Hypothyroidism   . Thyroid disease     Patient Active Problem List   Diagnosis Date Noted  . Weakness 05/19/2017  . Edema of left lower extremity 02/21/2017  . 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 Counseling regarding advanced care planning and goals of care 05/21/2016  . Iron deficiency anemia 05/16/2016  . Anemia, iron deficiency 01/07/2016  . Anemia 01/07/2016  . Abnormal stress test   . Chest pain 01/05/2016  . Shortness of breath   . Localized swelling of lower extremity   . Hyponatremia   . Hyperglycemia   . Musculoskeletal pain     Past Surgical History:  Procedure Laterality Date  . CARDIAC CATHETERIZATION N/A 01/07/2016   Procedure: Left Heart Cath and Coronary Angiography;  Surgeon: Jettie Booze, MD;  Location: [place] [facility];  Service: Cardiovascular;  Laterality: N/A;  . CATARACT EXTRACTION, BILATERAL    . FRACTURE SURGERY    . HIP SURGERY    . JOINT REPLACEMENT    . TONSILLECTOMY       OB History   No obstetric history on file.      Home Medications    Prior to Admission medications   Medication Sig Start Date End Date Taking? Authorizing Provider  B COMPLEX-C-CALCIUM PO Take 1 tablet by mouth daily.   Yes [provider]  carvedilol (COREG) 6.25 MG tablet Take 6.25 mg by mouth 2 (two) times daily with a meal.    Yes [provider]  ibuprofen (ADVIL,MOTRIN) 200 MG tablet Take 200 mg by mouth daily as needed (for pain).   Yes [provider]  Iron-Vitamins (GERITOL) LIQD Take 5-15 mLs by mouth daily.   Yes [provider]  levothyroxine (SYNTHROID) 112 MCG tablet Take 112 mcg by mouth daily before breakfast.   Yes [provider]  losartan (COZAAR) 25 MG tablet Take 1 tablet (25 mg total) by mouth daily. 04/19/17  Yes [name], MD  Multiple Vitamins-Minerals  (PRESERVISION/LUTEIN PO) Take 1 capsule by mouth See admin instructions. Take 1 capsule by mouth one to two times a day   Yes [provider]  OVER THE COUNTER MEDICATION Take 2 tablets by mouth See admin instructions. VITAFUSION gummyvites: Chew 2 gummies by mouth once a day   Yes [provider]  potassium chloride SA (K-DUR,KLOR-CON) 20 MEQ tablet Take 1 tablet (20 mEq total) by mouth daily. Patient taking differently: Take 5-10 mEq by mouth 2 (two) times a week.  02/02/17  Yes Isaiah Serge, NP  torsemide (DEMADEX) 20 MG tablet Take 0.5 tablets (10 mg total) by mouth daily. Patient taking differently: Take 2.5-5 mg by mouth daily.  05/20/17 03/01/18 Yes Colbert Ewing, MD  iron polysaccharides (NIFEREX) 150 MG capsule Take 1 capsule (150 mg total) by mouth daily. Patient not taking: Reported on 03/01/2018 05/18/17   Brunetta Genera, MD  levothyroxine (SYNTHROID, LEVOTHROID) 125 MCG tablet Take 1 tablet (125 mcg total) by mouth daily before breakfast. Patient not taking: Reported on 03/01/2018 05/20/17   Colbert Ewing, MD  Vitamin E 400 units TABS Take 400 Units by mouth daily.     [provider]    Family History Family History  Problem Relation Age of Onset  . Heart disease Mother     Social History Social History   Tobacco Use  . Smoking status: Former Research scientist (life sciences)  . Smokeless tobacco: Never Used  Substance Use Topics  . Alcohol use: No  . Drug use: No     Allergies   Patient has no known allergies.   Review of Systems Review of Systems  Neurological: Positive for weakness (generalized).  All other systems reviewed and are negative.    Physical Exam Updated Vital Signs BP 100/67 (BP Location: Left Arm)   Pulse 76   Temp (!) 97.4 F (36.3 C) (Oral)   Resp 18   Ht 5\' 3"  (1.6 m)   Wt 49.9 kg   SpO2 96%   BMI 19.49 kg/m   Physical Exam Vitals signs and nursing note reviewed.  Constitutional:      Appearance: She is well-developed.      Comments: Thin, elderly, appears pale  HENT:     Head: Normocephalic and atraumatic.     Mouth/Throat:     Comments: Mucous membranes somewhat dry Eyes:     Conjunctiva/sclera: Conjunctivae normal.     Pupils: Pupils are equal, round, and reactive to light.  Neck:     Musculoskeletal: Normal range of motion.  Cardiovascular:     Rate and Rhythm: Normal rate and regular rhythm.     Heart sounds: Normal heart sounds.  Pulmonary:     Effort: Pulmonary effort is normal.     Breath sounds: Normal breath sounds. No  decreased breath sounds, wheezing or rhonchi.     Comments: Lungs overall clear, no distress, speaking in full sentences without difficulty Abdominal:     General: Bowel sounds are normal.     Palpations: Abdomen is soft.  Musculoskeletal: Normal range of motion.     Comments: 1+ edema at the ankles; chronic varicose veins of the lower legs; no calf asymmetry or palpable cords  Skin:    General: Skin is warm and dry.  Neurological:     Mental Status: She is alert and oriented to person, place, and time.     Comments: AAOx3, answering questions and following commands appropriately; equal strength UE and LE bilaterally; CN grossly intact; moves all extremities appropriately without ataxia; no focal neuro deficits or facial asymmetry appreciated      ED Treatments / Results  Labs (all labs ordered are listed, but only abnormal results are displayed) Labs Reviewed  BASIC METABOLIC PANEL - Abnormal; Notable for the following components:      Result Value   Glucose, Bld 125 (*)    Creatinine, Ser 1.13 (*)    Calcium 8.6 (*)    GFR calc non Af Amer 43 (*)    GFR calc Af Amer 50 (*)    All other components within normal limits  CBC - Abnormal; Notable for the following components:   WBC 11.4 (*)    RBC 2.78 (*)    Hemoglobin 8.2 (*)    HCT 26.0 (*)    RDW 17.5 (*)    Platelets 95 (*)    All other components within normal limits  TSH - Abnormal; Notable for the  following components:   TSH 38.860 (*)    All other components within normal limits  BRAIN NATRIURETIC PEPTIDE - Abnormal; Notable for the following components:   B Natriuretic Peptide 774.8 (*)    All other components within normal limits  RETICULOCYTES - Abnormal; Notable for the following components:   Retic Ct Pct 5.4 (*)    RBC. 2.46 (*)    Immature Retic Fract 24.1 (*)    All other components within normal limits  URINE CULTURE  VITAMIN B12  FOLATE  IRON AND TIBC  FERRITIN  URINALYSIS, ROUTINE W REFLEX MICROSCOPIC  I-STAT TROPONIN, ED  TYPE AND SCREEN  PREPARE RBC (CROSSMATCH)    EKG EKG Interpretation  Date/Time:  Thursday March 01 2018 21:26:57 EST Ventricular Rate:  78 PR Interval:  202 QRS Duration: 144 QT Interval:  480 QTC Calculation: 547 R Axis:   -68 Text Interpretation:  Sinus rhythm with occasional Premature ventricular complexes and Possible Premature atrial complexes with Abberant conduction Left axis deviation Left bundle branch block Abnormal ECG No significant change since last tracing Confirmed by Duffy Bruce [phone]) on 03/02/2018 12:11:29 AM   Radiology Dg Chest 2 View  Result Date: 03/01/2018 CLINICAL DATA:  Generalized weakness EXAM: CHEST - 2 VIEW COMPARISON:  05/19/2017 FINDINGS: Cardiac shadow remains enlarged. Tortuous thoracic aorta is noted with mild calcifications. The lungs are well aerated bilaterally. No focal infiltrate or sizable effusion is seen. Chronic compression deformity of the lower thoracic spine is noted. IMPRESSION: Stable cardiomegaly without acute abnormality. Electronically Signed   By: Inez Catalina M.D.   On: 03/01/2018 23:28    Procedures Procedures (including critical care time)  CRITICAL CARE Performed by: Larene Pickett   Total critical care time: 45 minutes  Critical care time was exclusive of separately billable procedures and treating other patients.  Critical care  was necessary to treat or prevent  imminent or life-threatening deterioration.  Critical care was time spent personally by me on the following activities: development of treatment plan with patient and/or surrogate as well as nursing, discussions with consultants, evaluation of patient's response to treatment, examination of patient, obtaining history from patient or surrogate, ordering and performing treatments and interventions, ordering and review of laboratory studies, ordering and review of radiographic studies, pulse oximetry and re-evaluation of patient's condition.   Medications Ordered in ED Medications  sodium chloride flush (NS) 0.9 % injection 3 mL (has no administration in time range)  0.9 %  sodium chloride infusion (Manually program via Guardrails IV Fluids) (has no administration in time range)     Initial Impression / Assessment and Plan / ED Course  I have reviewed the triage vital signs and the nursing notes.  Pertinent labs & imaging results that were available during my care of the patient were reviewed by me and considered in my medical decision making (see chart for details).  85 year old female here with generalized weakness.  This is been progressive over the past several weeks but acutely worse over the past 48 to 72 hours.  Patient generally lives alone, has required assistance today and is barely able to walk around her house today which is far from her baseline.  Here she is elderly, thin, and very pale in appearance.  She denies any acute pain.  She does report what sounds like lightheadedness with standing but has not had any falls or syncopal events.  She has not had any focal numbness or weakness, headaches, or confusion.  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 [name] with oncology.  Did receive blood transfusion in December as well as iron transfusion.  She continues taking oral iron supplements.  Has also had some worsening SOB.  Has known CHF with EF of 20-25%.  EKG  here unchanged from prior.  Screening labs have resulted, does appear to be drop in hemoglobin.  Last on record was 10.6 in December 2019, 8.2 today.  Denies bloody/dark stools.  No prior hx of GI bleeding.  Not currently on anti-coagulation.  SrCr today 1.13, baseline is around 0.8- 0.9.  Daughter reports she barely drinks fluids during the day.  Given her SOB-- will obtain CXR prior to initiating IVF given CHF history.  Add TSH, BNP, anemia panel.    TSH grossly abnormal-- there have been issues with follow-up and PCP will not prescribe further meds until levels were re-checked so has not had medications recently.  CXR clear.  Patient attempted to ambulate here in the ED, unable to even make it across exam room which is far from her baseline.  Orthostatics are WNL.  Hemoglobin today is only 8.2, however she is clinically dry so suspect is actually lower than this.  Will transfuse 1 unit to start with and see how she tolerates added volume given her CHF.  UA still pending.  She will require admission.  Discussed with Dr. Hal Hope-- will admit for ongoing care.  Final Clinical Impressions(s) / ED Diagnoses   Final diagnoses:  Generalized weakness  Iron deficiency anemia, unspecified iron deficiency anemia type  Hypothyroidism, unspecified type    ED Discharge Orders    None       Larene Pickett, PA-C 03/02/18 0131    Duffy Bruce, MD 03/02/18 1306

## 2018-03-01 NOTE — ED Notes (Signed)
Pt transported to XR.  

## 2018-03-01 NOTE — ED Triage Notes (Signed)
Pt arrives POV w/ daughter for eval of generalized weakness acutely worsening today. Pt reports she has been feeling lethargic and dizzy since last night, Reports ongoing SOB acutely x 1 week. Pt appears pale, difficult time staying awake during triage. NARD, but pt does not have energy to answer triage questions. Hx of Non hodgkins lymphoma.

## 2018-03-01 NOTE — ED Notes (Signed)
Pt cj/o generalized weakness.  Pt alert and oriented x's 3.  Family at bedside

## 2018-03-01 NOTE — ED Notes (Signed)
Pt to xray at this time.

## 2018-03-02 ENCOUNTER — Encounter (HOSPITAL_COMMUNITY): Payer: Self-pay | Admitting: Internal Medicine

## 2018-03-02 ENCOUNTER — Other Ambulatory Visit: Payer: Self-pay

## 2018-03-02 ENCOUNTER — Telehealth: Payer: Self-pay | Admitting: Hematology

## 2018-03-02 ENCOUNTER — Observation Stay (HOSPITAL_BASED_OUTPATIENT_CLINIC_OR_DEPARTMENT_OTHER): Payer: Medicare Other

## 2018-03-02 ENCOUNTER — Telehealth: Payer: Self-pay | Admitting: *Deleted

## 2018-03-02 DIAGNOSIS — R609 Edema, unspecified: Secondary | ICD-10-CM

## 2018-03-02 DIAGNOSIS — I428 Other cardiomyopathies: Secondary | ICD-10-CM

## 2018-03-02 DIAGNOSIS — E039 Hypothyroidism, unspecified: Principal | ICD-10-CM

## 2018-03-02 DIAGNOSIS — R531 Weakness: Secondary | ICD-10-CM | POA: Diagnosis not present

## 2018-03-02 DIAGNOSIS — D509 Iron deficiency anemia, unspecified: Secondary | ICD-10-CM

## 2018-03-02 LAB — URINALYSIS, ROUTINE W REFLEX MICROSCOPIC
Bilirubin Urine: NEGATIVE
Glucose, UA: NEGATIVE mg/dL
Hgb urine dipstick: NEGATIVE
Ketones, ur: NEGATIVE mg/dL
Leukocytes,Ua: NEGATIVE
Nitrite: NEGATIVE
Protein, ur: NEGATIVE mg/dL
Specific Gravity, Urine: 1.005 (ref 1.005–1.030)
pH: 7 (ref 5.0–8.0)

## 2018-03-02 LAB — FERRITIN: Ferritin: 228 ng/mL (ref 11–307)

## 2018-03-02 LAB — BASIC METABOLIC PANEL
Anion gap: 11 (ref 5–15)
BUN: 16 mg/dL (ref 8–23)
CO2: 27 mmol/L (ref 22–32)
Calcium: 8.3 mg/dL — ABNORMAL LOW (ref 8.9–10.3)
Chloride: 96 mmol/L — ABNORMAL LOW (ref 98–111)
Creatinine, Ser: 0.94 mg/dL (ref 0.44–1.00)
GFR calc Af Amer: >60 mL/min (ref >60)
GFR calc non Af Amer: 53 mL/min — ABNORMAL LOW (ref >60)
Glucose, Bld: 108 mg/dL — ABNORMAL HIGH (ref 70–99)
Potassium: 3.4 mmol/L — ABNORMAL LOW (ref 3.5–5.1)
Sodium: 134 mmol/L — ABNORMAL LOW (ref 135–145)

## 2018-03-02 LAB — PATHOLOGIST SMEAR REVIEW

## 2018-03-02 LAB — TSH: TSH: 38.86 u[IU]/mL — ABNORMAL HIGH (ref 0.350–4.500)

## 2018-03-02 LAB — CBC
HCT: 28.2 % — ABNORMAL LOW (ref 36.0–46.0)
Hemoglobin: 9.5 g/dL — ABNORMAL LOW (ref 12.0–15.0)
MCH: 30.4 pg (ref 26.0–34.0)
MCHC: 33.7 g/dL (ref 30.0–36.0)
MCV: 90.4 fL (ref 80.0–100.0)
Platelets: 93 10*3/uL — ABNORMAL LOW (ref 150–400)
RBC: 3.12 MIL/uL — ABNORMAL LOW (ref 3.87–5.11)
RDW: 16.9 % — ABNORMAL HIGH (ref 11.5–15.5)
WBC: 10.1 10*3/uL (ref 4.0–10.5)
nRBC: 0.2 % (ref 0.0–0.2)

## 2018-03-02 LAB — IRON AND TIBC
Iron: 46 ug/dL (ref 28–170)
Saturation Ratios: 15 % (ref 10.4–31.8)
TIBC: 312 ug/dL (ref 250–450)
UIBC: 266 ug/dL

## 2018-03-02 LAB — RETICULOCYTES
Immature Retic Fract: 24.1 % — ABNORMAL HIGH (ref 2.3–15.9)
RBC.: 2.46 MIL/uL — ABNORMAL LOW (ref 3.87–5.11)
RETIC COUNT ABSOLUTE: 131.9 10*3/uL (ref 19.0–186.0)
Retic Ct Pct: 5.4 % — ABNORMAL HIGH (ref 0.4–3.1)

## 2018-03-02 LAB — MAGNESIUM: Magnesium: 2 mg/dL (ref 1.7–2.4)

## 2018-03-02 LAB — PREPARE RBC (CROSSMATCH)

## 2018-03-02 LAB — FOLATE: Folate: 15.7 ng/mL (ref >5.9)

## 2018-03-02 LAB — BRAIN NATRIURETIC PEPTIDE: B Natriuretic Peptide: 774.8 pg/mL — ABNORMAL HIGH (ref 0.0–100.0)

## 2018-03-02 LAB — VITAMIN B12: VITAMIN B 12: 314 pg/mL (ref 180–914)

## 2018-03-02 LAB — HEMOGLOBIN AND HEMATOCRIT, BLOOD
HCT: 29.3 % — ABNORMAL LOW (ref 36.0–46.0)
Hemoglobin: 9.5 g/dL — ABNORMAL LOW (ref 12.0–15.0)

## 2018-03-02 MED ORDER — ELDERTONIC PO LIQD
15.0000 mL | Freq: Every day | ORAL | Status: DC
  Administered 2018-03-02 – 2018-03-06 (×5): 15 mL via ORAL
  Filled 2018-03-02 (×5): qty 15

## 2018-03-02 MED ORDER — FUROSEMIDE 10 MG/ML IJ SOLN
10.0000 mg | Freq: Every day | INTRAMUSCULAR | Status: DC
  Administered 2018-03-02 – 2018-03-03 (×2): 10 mg via INTRAVENOUS
  Filled 2018-03-02 (×2): qty 4

## 2018-03-02 MED ORDER — ACETAMINOPHEN 325 MG PO TABS
650.0000 mg | ORAL_TABLET | Freq: Four times a day (QID) | ORAL | Status: DC | PRN
  Administered 2018-03-05: 650 mg via ORAL
  Filled 2018-03-02: qty 2

## 2018-03-02 MED ORDER — ONDANSETRON HCL 4 MG PO TABS: 4.0000 mg | ORAL_TABLET | Freq: Four times a day (QID) | ORAL | Status: DC | PRN

## 2018-03-02 MED ORDER — CARVEDILOL 6.25 MG PO TABS
6.2500 mg | ORAL_TABLET | Freq: Two times a day (BID) | ORAL | Status: DC
  Administered 2018-03-02 – 2018-03-06 (×9): 6.25 mg via ORAL
  Filled 2018-03-02 (×8): qty 1

## 2018-03-02 MED ORDER — SODIUM CHLORIDE 0.9 % IV BOLUS: 500.0000 mL | Freq: Once | INTRAVENOUS | Status: DC

## 2018-03-02 MED ORDER — LOSARTAN POTASSIUM 25 MG PO TABS
25.0000 mg | ORAL_TABLET | Freq: Every day | ORAL | Status: DC
  Administered 2018-03-02 – 2018-03-06 (×5): 25 mg via ORAL
  Filled 2018-03-02 (×5): qty 1

## 2018-03-02 MED ORDER — SODIUM CHLORIDE 0.9% IV SOLUTION
Freq: Once | INTRAVENOUS | Status: AC
  Administered 2018-03-02: 05:00:00 via INTRAVENOUS

## 2018-03-02 MED ORDER — VITAMIN E 180 MG (400 UNIT) PO CAPS
400.0000 [IU] | ORAL_CAPSULE | Freq: Every day | ORAL | Status: DC
  Administered 2018-03-02 – 2018-03-06 (×5): 400 [IU] via ORAL
  Filled 2018-03-02 (×5): qty 1

## 2018-03-02 MED ORDER — LEVOTHYROXINE SODIUM 100 MCG/5ML IV SOLN
66.0000 ug | Freq: Every day | INTRAVENOUS | Status: DC
  Administered 2018-03-02 – 2018-03-06 (×5): 66 ug via INTRAVENOUS
  Filled 2018-03-02 (×6): qty 5

## 2018-03-02 MED ORDER — ENOXAPARIN SODIUM 40 MG/0.4ML ~~LOC~~ SOLN
40.0000 mg | SUBCUTANEOUS | Status: DC
  Filled 2018-03-02: qty 0.4

## 2018-03-02 MED ORDER — POLYSACCHARIDE IRON COMPLEX 150 MG PO CAPS: 150.0000 mg | ORAL_CAPSULE | Freq: Every day | ORAL | Status: DC

## 2018-03-02 MED ORDER — ENOXAPARIN SODIUM 30 MG/0.3ML ~~LOC~~ SOLN
30.0000 mg | SUBCUTANEOUS | Status: DC
  Administered 2018-03-02: 30 mg via SUBCUTANEOUS
  Filled 2018-03-02: qty 0.3

## 2018-03-02 MED ORDER — POTASSIUM CHLORIDE CRYS ER 20 MEQ PO TBCR
40.0000 meq | EXTENDED_RELEASE_TABLET | Freq: Once | ORAL | Status: AC
  Administered 2018-03-02: 40 meq via ORAL
  Filled 2018-03-02: qty 2

## 2018-03-02 MED ORDER — ACETAMINOPHEN 650 MG RE SUPP: 650.0000 mg | Freq: Four times a day (QID) | RECTAL | Status: DC | PRN

## 2018-03-02 MED ORDER — VITAMIN E 400 UNITS PO TABS: 400.0000 [IU] | ORAL_TABLET | Freq: Every day | ORAL | Status: DC

## 2018-03-02 MED ORDER — ONDANSETRON HCL 4 MG/2ML IJ SOLN: 4.0000 mg | Freq: Four times a day (QID) | INTRAMUSCULAR | Status: DC | PRN

## 2018-03-02 MED ORDER — POTASSIUM CHLORIDE CRYS ER 10 MEQ PO TBCR
5.0000 meq | EXTENDED_RELEASE_TABLET | ORAL | Status: DC
  Administered 2018-03-05: 5 meq via ORAL
  Filled 2018-03-02: qty 1

## 2018-03-02 MED ORDER — GERITOL TONIC PO LIQD: 5.0000 mL | Freq: Every day | ORAL | Status: DC

## 2018-03-02 MED ORDER — TORSEMIDE 5 MG PO TABS: 2.5000 mg | ORAL_TABLET | Freq: Every day | ORAL | Status: DC

## 2018-03-02 MED ORDER — TORSEMIDE 5 MG PO TABS
2.5000 mg | ORAL_TABLET | Freq: Every day | ORAL | Status: DC
  Filled 2018-03-02: qty 0.5

## 2018-03-02 NOTE — Plan of Care (Signed)
Pt is alert and oriented X4. Denies any pain or discomfort. Blood transfusing at this moment. Daughter at bedside. Will continue monitoring.

## 2018-03-02 NOTE — ED Notes (Signed)
High Fall Risk precautions in place EXCEPT socks. Pt refuses to wear them.

## 2018-03-02 NOTE — H&P (Signed)
History and Physical    Kathleen Arias PRF:[id number] DOB: 11-30-1925 DOA: 03/01/2018  PCP: [name], MD  Patient coming from: Home.  Chief Complaint: Generalized weakness.  HPI: Kathleen Arias is a 85 y.o. female with history of lymphoma being followed by oncologist, chronic systolic heart failure last EF was around 20 to 25%, LBBB, chronic anemia, gallbladder pathology, hypothyroidism presents to the ER because of increasing weakness.  Patient states over the last few days patient has become increasingly weak and over the last 24 hours unable to ambulate because of the weakness.  Did not have any fall.  Did not have any chest pain productive cough fever chills nausea vomiting or diarrhea.  ED Course: In the ER patient was found to be generally weak.  Nonfocal.  Hemoglobin was around 8.  BNP was around 778.  Chest x-ray did not show anything acute.  TSH was 38.  Given the patient generalized weakness 1 unit of PRBC was ordered by the ER physician because of symptom.  Patient admitted for further observation.  Review of Systems: As per HPI, rest all negative.   Past Medical History:  Diagnosis Date  . Anemia   . Anginal pain ([place])   . Arthritis   . Cancer ([place])   . CHF (congestive heart failure) ([place])   . Dyspnea   . Dysrhythmia   . Hypertension   . Hypothyroidism   . Thyroid disease     Past Surgical History:  Procedure Laterality Date  . CARDIAC CATHETERIZATION N/A 01/07/2016   Procedure: Left Heart Cath and Coronary Angiography;  Surgeon: Jettie Booze, MD;  Location: [place] [facility];  Service: Cardiovascular;  Laterality: N/A;  . CATARACT EXTRACTION, BILATERAL    . FRACTURE SURGERY    . HIP SURGERY    . JOINT REPLACEMENT    . TONSILLECTOMY       reports that she has quit smoking. She has never used smokeless tobacco. She reports that she does not drink alcohol or use drugs.  No Known Allergies  Family History  Problem Relation Age of Onset  . Heart  disease Mother     Prior to Admission medications   Medication Sig Start Date End Date Taking? Authorizing Provider  B COMPLEX-C-CALCIUM PO Take 1 tablet by mouth daily.   Yes [provider]  carvedilol (COREG) 6.25 MG tablet Take 6.25 mg by mouth 2 (two) times daily with a meal.    Yes [provider]  ibuprofen (ADVIL,MOTRIN) 200 MG tablet Take 200 mg by mouth daily as needed (for pain).   Yes [provider]  Iron-Vitamins (GERITOL) LIQD Take 5-15 mLs by mouth daily.   Yes [provider]  levothyroxine (SYNTHROID) 112 MCG tablet Take 112 mcg by mouth daily before breakfast.   Yes [provider]  losartan (COZAAR) 25 MG tablet Take 1 tablet (25 mg total) by mouth daily. 04/19/17  Yes [name], MD  Multiple Vitamins-Minerals (PRESERVISION/LUTEIN PO) Take 1 capsule by mouth See admin instructions. Take 1 capsule by mouth one to two times a day   Yes [provider]  OVER THE COUNTER MEDICATION Take 2 tablets by mouth See admin instructions. VITAFUSION gummyvites: Chew 2 gummies by mouth once a day   Yes [provider]  potassium chloride SA (K-DUR,KLOR-CON) 20 MEQ tablet Take 1 tablet (20 mEq total) by mouth daily. Patient taking differently: Take 5-10 mEq by mouth 2 (two) times a week.  02/02/17  Yes Dorene Ar,  Otilio Carpen, NP  torsemide (DEMADEX) 20 MG tablet Take 0.5 tablets (10 mg total) by mouth daily. Patient taking differently: Take 2.5-5 mg by mouth daily.  05/20/17 03/01/18 Yes Colbert Ewing, MD  iron polysaccharides (NIFEREX) 150 MG capsule Take 1 capsule (150 mg total) by mouth daily. Patient not taking: Reported on 03/01/2018 05/18/17   Brunetta Genera, MD  levothyroxine (SYNTHROID, LEVOTHROID) 125 MCG tablet Take 1 tablet (125 mcg total) by mouth daily before breakfast. Patient not taking: Reported on 03/01/2018 05/20/17   Colbert Ewing, MD  Vitamin E 400 units TABS Take 400 Units by mouth daily.     [provider]    Physical Exam: Vitals:   03/01/18 2330 03/02/18 0000 03/02/18 0030 03/02/18 0100  BP: (!) 120/55 (!) 124/53 (!) 118/59 (!) 112/59  Pulse: 73 74 73 71  Resp: (!) 22 18 16  (!) 23  Temp:      TempSrc:      SpO2: 92% 94% 92% 93%  Weight:      Height:          Constitutional: Moderately built and nourished. Vitals:   03/01/18 2330 03/02/18 0000 03/02/18 0030 03/02/18 0100  BP: (!) 120/55 (!) 124/53 (!) 118/59 (!) 112/59  Pulse: 73 74 73 71  Resp: (!) 22 18 16  (!) 23  Temp:      TempSrc:      SpO2: 92% 94% 92% 93%  Weight:      Height:       Eyes: Anicteric no pallor. ENMT: No discharge from the ears eyes nose and mouth. Neck: No mass felt.  No neck rigidity. Respiratory: No rhonchi or crepitations. Cardiovascular: S1-S2 heard. Abdomen: Mildly distended nontender bowel sounds present. Musculoskeletal: Bilateral lower extremity edema present. Skin: No rash. Neurologic: Alert awake oriented to time place and person.  Moves all extremities. Psychiatric: Appears normal.  Normal affect.   Labs on Admission: I have personally reviewed following labs and imaging studies  CBC: Recent Labs  Lab 03/01/18 2139  WBC 11.4*  HGB 8.2*  HCT 26.0*  MCV 93.5  PLT 95*   Basic Metabolic Panel: Recent Labs  Lab 03/01/18 2139  NA 136  K 3.6  CL 98  CO2 24  GLUCOSE 125*  BUN 18  CREATININE 1.13*  CALCIUM 8.6*   GFR: Estimated Creatinine Clearance: 26.1 mL/min (A) (by C-G formula based on SCr of 1.13 mg/dL (H)). Liver Function Tests: No results for input(s): AST, ALT, ALKPHOS, BILITOT, PROT, ALBUMIN in the last 168 hours. No results for input(s): LIPASE, AMYLASE in the last 168 hours. No results for input(s): AMMONIA in the last 168 hours. Coagulation Profile: No results for input(s): INR, PROTIME in the last 168 hours. Cardiac Enzymes: No results for input(s): CKTOTAL, CKMB, CKMBINDEX, TROPONINI in the last 168 hours. BNP (last 3 results) Recent Labs     05/31/17 1540  PROBNP 11,281*   HbA1C: No results for input(s): HGBA1C in the last 72 hours. CBG: No results for input(s): GLUCAP in the last 168 hours. Lipid Profile: No results for input(s): CHOL, HDL, LDLCALC, TRIG, CHOLHDL, LDLDIRECT in the last 72 hours. Thyroid Function Tests: Recent Labs    03/01/18 2337  TSH 38.860*   Anemia Panel: Recent Labs    03/01/18 2337  VITAMINB12 314  FOLATE 15.7  FERRITIN 228  TIBC 312  IRON 46  RETICCTPCT 5.4*   Urine analysis:    Component Value Date/Time   COLORURINE YELLOW 05/19/2017 1239   APPEARANCEUR  HAZY (A) 05/19/2017 1239   LABSPEC >1.046 (H) 05/19/2017 1239   PHURINE 6.0 05/19/2017 1239   GLUCOSEU NEGATIVE 05/19/2017 1239   HGBUR SMALL (A) 05/19/2017 1239   BILIRUBINUR NEGATIVE 05/19/2017 1239   KETONESUR NEGATIVE 05/19/2017 1239   PROTEINUR NEGATIVE 05/19/2017 1239   UROBILINOGEN 0.2 08/21/2010 1229   NITRITE POSITIVE (A) 05/19/2017 1239   LEUKOCYTESUR NEGATIVE 05/19/2017 1239   Sepsis Labs: @LABRCNTIP (procalcitonin:4,lacticidven:4) )No results found for this or any previous visit (from the past 240 hour(s)).   Radiological Exams on Admission: Dg Chest 2 View  Result Date: 03/01/2018 CLINICAL DATA:  Generalized weakness EXAM: CHEST - 2 VIEW COMPARISON:  05/19/2017 FINDINGS: Cardiac shadow remains enlarged. Tortuous thoracic aorta is noted with mild calcifications. The lungs are well aerated bilaterally. No focal infiltrate or sizable effusion is seen. Chronic compression deformity of the lower thoracic spine is noted. IMPRESSION: Stable cardiomegaly without acute abnormality. Electronically Signed   By: Inez Catalina M.D.   On: 03/01/2018 23:28    EKG: Independently reviewed.  Normal sinus rhythm LBBB.  PVCs.  Assessment/Plan Principal Problem:   Generalized weakness Active Problems:   Anemia, iron deficiency   Splenic marginal zone b-cell lymphoma ([facility])   NICM (nonischemic cardiomyopathy) ([facility])    Hypothyroidism    1. Generalized weakness -suspect weakness likely from severe hypothyroidism.  Not sure if patient has been taking her medications as advised since patient also has some visual issues.  At this time I have dosed patient's Synthroid dose as IV.  Anemia also may be contributing to weakness.  1 unit of PRBC was ordered by the ER physician.  If symptoms do not improve may consider further neurological work-up.  At this time appears nonfocal. 2. Chronic systolic heart failure does have bilateral lower extremity edema.  Patient is on torsemide.  I will dose it as IV Lasix for now.  Patient is on ARB and beta-blocker.  Note that patient creatinine is mildly elevated so may have to hold ARB if there is further worsening.  Closely follow intake output metabolic panel.  Dopplers of the lower extremity ordered. 3. Hypothyroidism see #1.  On IV Synthroid for now. 4. Lymphoma being followed by Dr. [name]. 5. Chronic anemia see #1. 6. Mildly elevated renal function need to follow closely.  Patient is on ARB and IV Lasix.   DVT prophylaxis: Lovenox. Code Status: DNR. Family Communication: Patient's daughter. Disposition Plan: To be determined. Consults called: None. Admission status: Observation.   Rise Patience MD [facility] Pager [phone].  If 7PM-7AM, please contact night-coverage [email or web address] Password [email or web address]  03/02/2018, 1:50 AM

## 2018-03-02 NOTE — ED Notes (Signed)
Ordered Hospital bed  

## 2018-03-02 NOTE — Progress Notes (Signed)
Will adjust lovenox slightly. She is 85yo but crcl still above 30 ml/min.   Change lovenox to 40mg  SQ qday  Onnie Boer, PharmD, BCIDP, AAHIVP, CPP Infectious Disease Pharmacist 03/02/2018 2:55 PM

## 2018-03-02 NOTE — Evaluation (Addendum)
Physical Therapy Evaluation Patient Details Name: Kathleen Arias MRN: [id number] DOB: 1925-08-18 Today's Date: 03/02/2018   History of Present Illness  Pt is a 85 y/o female admitted secondary to generalized weakness and inability to walk. Pt found to have severe hypothyroidism. PMH including but not limited to cancer, CHF and HTN.     Clinical Impression  Pt presented sitting EOB, awake and willing to participate in therapy session. [name] present throughout session as well. Both pt and daughter very anxious throughout regarding mobility. Prior to admission, pt reported that she ambulated with use of a RW. She lives alone; however, has family close by that can assist intermittently but daughter reporting cannot provide 24/7. Pt currently very limited secondary to generalized weakness and fatigue. VSS throughout. Pt would continue to benefit from skilled physical therapy services at this time while admitted and after d/c to address the below listed limitations in order to improve overall safety and independence with functional mobility.     Follow Up Recommendations SNF;Other (comment)(pt adamant about d/c home, will need HHPT/HH aide)    Equipment Recommendations  3in1 (PT)    Recommendations for Other Services       Precautions / Restrictions Precautions Precautions: Fall Restrictions Weight Bearing Restrictions: No      Mobility  Bed Mobility Overal bed mobility: Needs Assistance Bed Mobility: Sit to Supine       Sit to supine: Min assist   General bed mobility comments: increased time and effort, assistance needed for bilateral LEs to return to bed  Transfers Overall transfer level: Needs assistance Equipment used: 1 person hand held assist Transfers: Sit to/from Stand Sit to Stand: Mod assist         General transfer comment: increased time and effort, use of momentum, mod A to power into standing and for stability with transition; 1HHA and other UE on bed  rail  Ambulation/Gait             General Gait Details: pt and pt's daughter both adamant that pt cannot ambulate at this time despite encouragement from PT  Stairs            Wheelchair Mobility    Modified Rankin (Stroke Patients Only)       Balance Overall balance assessment: Needs assistance Sitting-balance support: Feet supported Sitting balance-Leahy Scale: Good     Standing balance support: Bilateral upper extremity supported;Single extremity supported Standing balance-Leahy Scale: Poor                               Pertinent Vitals/Pain Pain Assessment: No/denies pain    Home Living Family/patient expects to be discharged to:: Private residence Living Arrangements: Alone Available Help at Discharge: Family;Available PRN/intermittently Type of Home: House Home Access: Stairs to enter Entrance Stairs-Rails: Left Entrance Stairs-Number of Steps: 4 Home Layout: One level Home Equipment: Walker - 2 wheels      Prior Function Level of Independence: Independent with assistive device(s)         Comments: ambulates with RW     Hand Dominance        Extremity/Trunk Assessment   Upper Extremity Assessment Upper Extremity Assessment: Generalized weakness    Lower Extremity Assessment Lower Extremity Assessment: Generalized weakness       Communication   Communication: No difficulties  Cognition Arousal/Alertness: Awake/alert Behavior During Therapy: Anxious Overall Cognitive Status: Impaired/Different from baseline Area of Impairment: Safety/judgement;Problem solving  Safety/Judgement: Decreased awareness of safety   Problem Solving: Difficulty sequencing;Requires verbal cues        General Comments      Exercises     Assessment/Plan    PT Assessment Patient needs continued PT services  PT Problem List Decreased strength;Decreased activity tolerance;Decreased balance;Decreased  mobility;Decreased coordination;Decreased knowledge of use of DME;Decreased safety awareness;Decreased knowledge of precautions       PT Treatment Interventions DME instruction;Gait training;Stair training;Functional mobility training;Therapeutic activities;Therapeutic exercise;Balance training;Neuromuscular re-education;Patient/family education    PT Goals (Current goals can be found in the Care Plan section)  Acute Rehab PT Goals Patient Stated Goal: build up strength and return home PT Goal Formulation: With patient/family Time For Goal Achievement: 03/16/18 Potential to Achieve Goals: Fair    Frequency Min 2X/week   Barriers to discharge        Co-evaluation               AM-PAC PT "6 Clicks" Mobility  Outcome Measure Help needed turning from your back to your side while in a flat bed without using bedrails?: A Little Help needed moving from lying on your back to sitting on the side of a flat bed without using bedrails?: A Little Help needed moving to and from a bed to a chair (including a wheelchair)?: A Lot Help needed standing up from a chair using your arms (e.g., wheelchair or bedside chair)?: A Lot Help needed to walk in hospital room?: A Lot Help needed climbing 3-5 steps with a railing? : A Lot 6 Click Score: 14    End of Session   Activity Tolerance: Patient limited by fatigue Patient left: in bed;with call bell/phone within reach;with bed alarm set;with family/visitor present Nurse Communication: Mobility status PT Visit Diagnosis: Other abnormalities of gait and mobility (R26.89);Muscle weakness (generalized) (M62.81)    Time: 2947-6546 PT Time Calculation (min) (ACUTE ONLY): 27 min   Charges:   PT Evaluation $PT Eval Moderate Complexity: 1 Mod PT Treatments $Therapeutic Activity: 8-22 mins        Sherie Don, PT, DPT  Acute Rehabilitation Services Pager [phone] Office [place] 03/02/2018, 3:05 PM

## 2018-03-02 NOTE — Progress Notes (Signed)
Pat admitted after midnight presented with generalized weakness. Found to have elevated tsh  likely related to non-compliance in setting of poor vision, lives alone, family not recognizing level of care pt now needs and anemia. Received 1 unit PRBC's.  PT recommends snf. Will get social work  PE Gen: pale alert oriented x3. No acute distress CV RRR no mgr no LE edema Resp:clear BS no wheeze no increased work of breathing Abd: flat soft +BS no guarding or rebounding   Plan Continue IV synthroid Follow pathogist smear Monitor Hg.  Social work possible placement   [employer]. Black, NP

## 2018-03-02 NOTE — Telephone Encounter (Signed)
Called regarding 2/26

## 2018-03-02 NOTE — Telephone Encounter (Signed)
Daughter Volney Presser called office. Wanted Dr. [name] to know patient was taken to ED and admitted to hospital last night.Hgb was down again. Patient has f/u appt 3/26 with Dr. [name]. Advised daughter that information will be given to Dr. [name].

## 2018-03-03 DIAGNOSIS — R531 Weakness: Secondary | ICD-10-CM | POA: Diagnosis not present

## 2018-03-03 DIAGNOSIS — N390 Urinary tract infection, site not specified: Secondary | ICD-10-CM | POA: Diagnosis not present

## 2018-03-03 DIAGNOSIS — E039 Hypothyroidism, unspecified: Secondary | ICD-10-CM | POA: Diagnosis not present

## 2018-03-03 LAB — TYPE AND SCREEN
ABO/RH(D): O NEG
Antibody Screen: NEGATIVE
Unit division: 0

## 2018-03-03 LAB — BASIC METABOLIC PANEL
Anion gap: 6 (ref 5–15)
BUN: 16 mg/dL (ref 8–23)
CO2: 27 mmol/L (ref 22–32)
Calcium: 7.7 mg/dL — ABNORMAL LOW (ref 8.9–10.3)
Chloride: 102 mmol/L (ref 98–111)
Creatinine, Ser: 0.81 mg/dL (ref 0.44–1.00)
GFR calc Af Amer: >60 mL/min (ref >60)
GFR calc non Af Amer: >60 mL/min (ref >60)
Glucose, Bld: 89 mg/dL (ref 70–99)
Potassium: 3.8 mmol/L (ref 3.5–5.1)
Sodium: 135 mmol/L (ref 135–145)

## 2018-03-03 LAB — CBC
HCT: 24.9 % — ABNORMAL LOW (ref 36.0–46.0)
HEMOGLOBIN: 8.4 g/dL — AB (ref 12.0–15.0)
MCH: 30.9 pg (ref 26.0–34.0)
MCHC: 33.7 g/dL (ref 30.0–36.0)
MCV: 91.5 fL (ref 80.0–100.0)
Platelets: 77 10*3/uL — ABNORMAL LOW (ref 150–400)
RBC: 2.72 MIL/uL — ABNORMAL LOW (ref 3.87–5.11)
RDW: 17 % — ABNORMAL HIGH (ref 11.5–15.5)
WBC: 9.5 10*3/uL (ref 4.0–10.5)
nRBC: 0.2 % (ref 0.0–0.2)

## 2018-03-03 LAB — BPAM RBC
Blood Product Expiration Date: 202002192359
ISSUE DATE / TIME: 202002140518
Unit Type and Rh: 9500

## 2018-03-03 MED ORDER — SODIUM CHLORIDE 0.9 % IV SOLN
1.0000 g | INTRAVENOUS | Status: AC
  Administered 2018-03-03 – 2018-03-04 (×2): 1 g via INTRAVENOUS
  Filled 2018-03-03 (×2): qty 10

## 2018-03-03 MED ORDER — LUBRIDERM SERIOUSLY SENSITIVE EX LOTN
TOPICAL_LOTION | Freq: Two times a day (BID) | CUTANEOUS | Status: DC
  Administered 2018-03-03 – 2018-03-06 (×6): via TOPICAL
  Filled 2018-03-03: qty 473

## 2018-03-03 MED ORDER — TORSEMIDE 20 MG PO TABS
10.0000 mg | ORAL_TABLET | Freq: Every day | ORAL | Status: DC
  Administered 2018-03-04 – 2018-03-06 (×3): 10 mg via ORAL
  Filled 2018-03-03 (×3): qty 1

## 2018-03-03 NOTE — NC FL2 (Signed)
[place] MEDICAID FL2 LEVEL OF CARE SCREENING TOOL     IDENTIFICATION  Patient Name: Kathleen Arias Birthdate: 1925-08-14 Sex: female Admission Date (Current Location): 03/01/2018  [facility] Res[facility] and [place] Number:  Herbalist and Address:  [facility] [place]. [facility], [place] [address], [place], [place] [address]      Provider Number: [id number]  Attending Physician Name and Address:  Geradine Girt, DO  Relative Name and Phone Number:  [name], Daughter, [place], Daughter, [phone]    Current Level of Care: Hospital Recommended Level of Care: [place] Prior Approval Number: [id number]  Date Approved/Denied: 03/03/18 PASRR Number:    Discharge Plan: SNF    Current Diagnoses: Patient Active Problem List   Diagnosis Date Noted  . UTI (urinary tract infection) 03/03/2018  . NICM (nonischemic cardiomyopathy) ([place]) 03/02/2018  . Hypothyroidism 03/02/2018  . Generalized weakness 05/19/2017  . Edema of left lower extremity 02/21/2017  . Splenic marginal zone b-cell lymphoma ([place]) 06/20/2016  . Counseling regarding advanced care planning and goals of care 05/21/2016  . Iron deficiency anemia 05/16/2016  . Anemia, iron deficiency 01/07/2016  . Anemia 01/07/2016  . Abnormal stress test   . Chest pain 01/05/2016  . Shortness of breath   . Localized swelling of lower extremity   . Hyponatremia   . Hyperglycemia   . Musculoskeletal pain     Orientation RESPIRATION BLADDER Height & Weight     Self, Time, Situation, Place  Normal Continent Weight: 110 lb 7.2 oz (50.1 kg) Height:  5\' 3"  (160 cm)  BEHAVIORAL SYMPTOMS/MOOD NEUROLOGICAL BOWEL NUTRITION STATUS      Continent Diet(Heart Healthy thin fluids)  AMBULATORY STATUS COMMUNICATION OF NEEDS Skin   Limited Assist   Normal, Bruising(On sacrum)                       Personal Care Assistance Level of Assistance  Feeding, Bathing, Dressing, Total care Bathing  Assistance: Limited assistance Feeding assistance: Independent Dressing Assistance: Limited assistance Total Care Assistance: Limited assistance   Functional Limitations Info  Sight, Hearing, Speech Sight Info: Adequate Hearing Info: Adequate Speech Info: Adequate    SPECIAL CARE FACTORS FREQUENCY  PT (By licensed PT), OT (By licensed OT)     PT Frequency: 5x/wk OT Frequency: 5x/wk            Contractures Contractures Info: Not present    Additional Factors Info  Code Status, Allergies Code Status Info: DNR Allergies Info:  No Known Allergies           Current Medications (03/03/2018):  This is the current hospital active medication list Current Facility-Administered Medications  Medication Dose Route Frequency Provider Last Rate Last Dose  . acetaminophen (TYLENOL) tablet 650 mg  650 mg Oral Q6H PRN Rise Patience, MD       Or  . acetaminophen (TYLENOL) suppository 650 mg  650 mg Rectal Q6H PRN Rise Patience, MD      . carvedilol (COREG) tablet 6.25 mg  6.25 mg Oral BID WC Rise Patience, MD   6.25 mg at 03/03/18 3893  . cefTRIAXone (ROCEPHIN) 1 g in sodium chloride 0.9 % 100 mL IVPB  1 g Intravenous Q24H Vann, Jessica U, DO      . enoxaparin (LOVENOX) injection 40 mg  40 mg Subcutaneous Q24H Vann, Jessica U, DO      . geriatric multivitamins-minerals (ELDERTONIC/GEVRABON) liquid 15 mL  15 mL Oral  Daily Rise Patience, MD   15 mL at 03/03/18 1009  . levothyroxine (SYNTHROID, LEVOTHROID) injection 66 mcg  66 mcg Intravenous Daily Rise Patience, MD   66 mcg at 03/03/18 1010  . losartan (COZAAR) tablet 25 mg  25 mg Oral Daily Rise Patience, MD   25 mg at 03/03/18 1006  . ondansetron (ZOFRAN) tablet 4 mg  4 mg Oral Q6H PRN Rise Patience, MD       Or  . ondansetron [facility]) injection 4 mg  4 mg Intravenous Q6H PRN Rise Patience, MD      . Derrill Memo ON 03/05/2018] potassium chloride (K-DUR,KLOR-CON) CR tablet 5 mEq  5 mEq Oral  Once per day on Mon Thu Kakrakandy, Arshad N, MD      . sodium chloride flush (NS) 0.9 % injection 3 mL  3 mL Intravenous Once Rise Patience, MD      . Derrill Memo ON 03/04/2018] torsemide (DEMADEX) tablet 10 mg  10 mg Oral Daily Black, Lezlie Octave, NP      . vitamin E capsule 400 Units  400 Units Oral Daily Rise Patience, MD   400 Units at 03/03/18 1006     Discharge Medications: Please see discharge summary for a list of discharge medications.  Relevant Imaging Results:  Relevant Lab Results:   Additional Information SSN: [id number]  [place], LCSWA

## 2018-03-03 NOTE — Progress Notes (Signed)
CSW met with patient and patient's son at bedside. Patient stated that she would like some time to discuss with her family if she would like to go to a skilled nursing facility or go home with home health.   CSW will follow up on Sunday.    , MSW, LCSW-A Clinical Social Worker [place]  

## 2018-03-03 NOTE — Progress Notes (Addendum)
[facility] PROGRESS NOTE  Kathleen Arias EXB:[id number] DOB: 11/05/1925 DOA: 03/01/2018 PCP: [name], MD  Assessment/Plan:  1. Generalized weakness - likely related to severe hypothyroidism in setting of general deconditioning and UTI. Family reports patient is "house bound". Also not likely that patient has been taking her medications as advised. She has vision issues and memory issues.  Anemia also may be contributing to weakness.  1 unit of PRBC was ordered by the ER physician. Hg 8.4 down from 9.5 post transfusion. fobt pending. Anemia panel with rbc 2.4 and retic ct 5.4 otherwise within limits of normal PT evaluated and recommended snf. Awaiting authorization and approval 2. Chronic systolic heart failure does have bilateral lower extremity edema.  home meds include torsemide. changing IV Lasix back to home demadex starting tomorrow.  Patient is on ARB and beta-blocker. Dopplers of the lower extremity ordered and prelim report negative for DVT 3. Hypothyroidism see #1.  On IV Synthroid for now. 4. Lymphoma being followed by Dr. [name]. 5. Chronic anemia see #1. 6. Mildly elevated renal function need to follow closely. Resolved this am.  7. UTI urinalysis with WBC 11-20, urine culture with greater than 100000 colonies/ml grm neg rods. She is afebrile and non-toxic appearing. Provide rocephin per pharmacy   Code Status: full Family Communication: daughter Disposition Plan: snf   Consultants:    Procedures:    Antibiotics:    HPI/Subjective: Admitted with generalized weakness. Work up reveals severe hypothyroidism, uti, anemia. Transfused, IV synthroid, rocephin. Gentle hydration    Objective: Vitals:   03/03/18 0900 03/03/18 1156  BP: 126/64 104/60  Pulse: 74 66  Resp: 19 17  Temp: 98.2 F (36.8 C) (!) 97.4 F (36.3 C)  SpO2: 96% 96%    Intake/Output Summary (Last 24 hours) at 03/03/2018 1331 Last data filed at 03/02/2018 1700 Gross per 24 hour  Intake  120 ml  Output -  Net 120 ml   Filed Weights   03/01/18 2137 03/02/18 0422  Weight: 49.9 kg 50.1 kg    Exam:   General:  Thin frail pale appearing no acute distress  Cardiovascular: rrr no mgr no le edema  Respiratory: normal effort somewhat shallow BS clear  Abdomen: non-distedned +BS no guarding or rebounding  Musculoskeletal: joints without swelling/erythema   Data Reviewed: Basic Metabolic Panel: Recent Labs  Lab 03/01/18 2139 03/02/18 1152 03/02/18 1553 03/03/18 0617  NA 136 134*  --  135  K 3.6 3.4*  --  3.8  CL 98 96*  --  102  CO2 24 27  --  27  GLUCOSE 125* 108*  --  89  BUN 18 16  --  16  CREATININE 1.13* 0.94  --  0.81  CALCIUM 8.6* 8.3*  --  7.7*  MG  --   --  2.0  --    Liver Function Tests: No results for input(s): AST, ALT, ALKPHOS, BILITOT, PROT, ALBUMIN in the last 168 hours. No results for input(s): LIPASE, AMYLASE in the last 168 hours. No results for input(s): AMMONIA in the last 168 hours. CBC: Recent Labs  Lab 03/01/18 2139 03/02/18 1152 03/03/18 0617  WBC 11.4* 10.1 9.5  HGB 8.2* 9.5*  9.5* 8.4*  HCT 26.0* 28.2*  29.3* 24.9*  MCV 93.5 90.4 91.5  PLT 95* 93* 77*   Cardiac Enzymes: No results for input(s): CKTOTAL, CKMB, CKMBINDEX, TROPONINI in the last 168 hours. BNP (last 3 results) Recent Labs    05/19/17 1319 03/01/18 2337  BNP 2,200.8*  774.8*    ProBNP (last 3 results) Recent Labs    05/31/17 1540  PROBNP 11,281*    CBG: No results for input(s): GLUCAP in the last 168 hours.  Recent Results (from the past 240 hour(s))  Urine culture     Status: Abnormal (Preliminary result)   Collection Time: 03/02/18 11:17 AM  Result Value Ref Range Status   Specimen Description URINE, CLEAN CATCH  Final   Special Requests   Final    NONE Performed at [facility], [address], [place], [place] [address]    Culture >=100,000 COLONIES/mL GRAM NEGATIVE RODS (A)  Final   Report Status PENDING  Incomplete      Studies: Dg Chest 2 View  Result Date: 03/01/2018 CLINICAL DATA:  Generalized weakness EXAM: CHEST - 2 VIEW COMPARISON:  05/19/2017 FINDINGS: Cardiac shadow remains enlarged. Tortuous thoracic aorta is noted with mild calcifications. The lungs are well aerated bilaterally. No focal infiltrate or sizable effusion is seen. Chronic compression deformity of the lower thoracic spine is noted. IMPRESSION: Stable cardiomegaly without acute abnormality. Electronically Signed   By: Inez Catalina M.D.   On: 03/01/2018 23:28   Vas [place] Lower Extremity Venous (dvt)  Result Date: 03/02/2018  Lower Venous Study Indications: Swelling.  Performing Technologist: June Leap RDMS, RVT  Examination Guidelines: A complete evaluation includes B-mode imaging, spectral Doppler, color Doppler, and power Doppler as needed of all accessible portions of each vessel. Bilateral testing is considered an integral part of a complete examination. Limited examinations for reoccurring indications may be performed as noted.  Right Venous Findings: +---------+---------------+---------+-----------+----------+-------+          CompressibilityPhasicitySpontaneityPropertiesSummary +---------+---------------+---------+-----------+----------+-------+ CFV      Full           Yes      Yes                          +---------+---------------+---------+-----------+----------+-------+ SFJ      Full                                                 +---------+---------------+---------+-----------+----------+-------+ FV Prox  Full                                                 +---------+---------------+---------+-----------+----------+-------+ FV Mid   Full                                                 +---------+---------------+---------+-----------+----------+-------+ FV DistalFull                                                 +---------+---------------+---------+-----------+----------+-------+ PFV      Full                                                  +---------+---------------+---------+-----------+----------+-------+  POP      Full           Yes      Yes                          +---------+---------------+---------+-----------+----------+-------+ PTV      Full                                                 +---------+---------------+---------+-----------+----------+-------+ PERO     Full                                                 +---------+---------------+---------+-----------+----------+-------+  Left Venous Findings: +---------+---------------+---------+-----------+----------+-------+          CompressibilityPhasicitySpontaneityPropertiesSummary +---------+---------------+---------+-----------+----------+-------+ CFV      Full           Yes      Yes                          +---------+---------------+---------+-----------+----------+-------+ SFJ      Full                                                 +---------+---------------+---------+-----------+----------+-------+ FV Prox  Full                                                 +---------+---------------+---------+-----------+----------+-------+ FV Mid   Full                                                 +---------+---------------+---------+-----------+----------+-------+ FV DistalFull                                                 +---------+---------------+---------+-----------+----------+-------+ PFV      Full                                                 +---------+---------------+---------+-----------+----------+-------+ POP      Full           Yes      Yes                          +---------+---------------+---------+-----------+----------+-------+ PTV      Full                                                 +---------+---------------+---------+-----------+----------+-------+  PERO     Full                                                  +---------+---------------+---------+-----------+----------+-------+    Summary: Right: There is no evidence of deep vein thrombosis in the lower extremity. No cystic structure found in the popliteal fossa. Left: There is no evidence of deep vein thrombosis in the lower extremity. No cystic structure found in the popliteal fossa.  *See table(s) above for measurements and observations.    Preliminary     Scheduled Meds: . carvedilol  6.25 mg Oral BID WC  . enoxaparin (LOVENOX) injection  40 mg Subcutaneous Q24H  . geriatric multivitamins-minerals  15 mL Oral Daily  . levothyroxine  66 mcg Intravenous Daily  . losartan  25 mg Oral Daily  . [START ON 03/05/2018] potassium chloride SA  5 mEq Oral Once per day on Mon Thu  . sodium chloride flush  3 mL Intravenous Once  . [START ON 03/04/2018] torsemide  10 mg Oral Daily  . vitamin E  400 Units Oral Daily   Continuous Infusions: . cefTRIAXone (ROCEPHIN)  IV      Principal Problem:   Hypothyroidism Active Problems:   Anemia, iron deficiency   Generalized weakness   NICM (nonischemic cardiomyopathy) ([facility])   UTI (urinary tract infection)   Splenic marginal zone b-cell lymphoma ([place])    Time spent: 43 minutes    [place] NP  [facility]  If 7PM-7AM, please contact night-coverage at [email or web address], password [facility] 03/03/2018, 1:31 PM  LOS: 0 days

## 2018-03-04 ENCOUNTER — Observation Stay (HOSPITAL_COMMUNITY): Payer: Medicare Other

## 2018-03-04 DIAGNOSIS — N3000 Acute cystitis without hematuria: Secondary | ICD-10-CM

## 2018-03-04 DIAGNOSIS — C8307 Small cell B-cell lymphoma, spleen: Secondary | ICD-10-CM

## 2018-03-04 DIAGNOSIS — R531 Weakness: Secondary | ICD-10-CM | POA: Diagnosis present

## 2018-03-04 DIAGNOSIS — Z8249 Family history of ischemic heart disease and other diseases of the circulatory system: Secondary | ICD-10-CM | POA: Diagnosis not present

## 2018-03-04 DIAGNOSIS — I5022 Chronic systolic (congestive) heart failure: Secondary | ICD-10-CM | POA: Diagnosis present

## 2018-03-04 DIAGNOSIS — Z9119 Patient's noncompliance with other medical treatment and regimen: Secondary | ICD-10-CM | POA: Diagnosis not present

## 2018-03-04 DIAGNOSIS — Z79899 Other long term (current) drug therapy: Secondary | ICD-10-CM | POA: Diagnosis not present

## 2018-03-04 DIAGNOSIS — B9620 Unspecified Escherichia coli [E. coli] as the cause of diseases classified elsewhere: Secondary | ICD-10-CM | POA: Diagnosis present

## 2018-03-04 DIAGNOSIS — Z79890 Hormone replacement therapy (postmenopausal): Secondary | ICD-10-CM | POA: Diagnosis not present

## 2018-03-04 DIAGNOSIS — D509 Iron deficiency anemia, unspecified: Secondary | ICD-10-CM | POA: Diagnosis present

## 2018-03-04 DIAGNOSIS — I110 Hypertensive heart disease with heart failure: Secondary | ICD-10-CM | POA: Diagnosis present

## 2018-03-04 DIAGNOSIS — E039 Hypothyroidism, unspecified: Secondary | ICD-10-CM | POA: Diagnosis present

## 2018-03-04 DIAGNOSIS — N390 Urinary tract infection, site not specified: Secondary | ICD-10-CM | POA: Diagnosis present

## 2018-03-04 DIAGNOSIS — I428 Other cardiomyopathies: Secondary | ICD-10-CM | POA: Diagnosis present

## 2018-03-04 DIAGNOSIS — Z87891 Personal history of nicotine dependence: Secondary | ICD-10-CM | POA: Diagnosis not present

## 2018-03-04 DIAGNOSIS — C8510 Unspecified B-cell lymphoma, unspecified site: Secondary | ICD-10-CM | POA: Diagnosis present

## 2018-03-04 DIAGNOSIS — Z66 Do not resuscitate: Secondary | ICD-10-CM | POA: Diagnosis present

## 2018-03-04 LAB — BASIC METABOLIC PANEL
Anion gap: 8 (ref 5–15)
BUN: 16 mg/dL (ref 8–23)
CO2: 28 mmol/L (ref 22–32)
Calcium: 7.9 mg/dL — ABNORMAL LOW (ref 8.9–10.3)
Chloride: 99 mmol/L (ref 98–111)
Creatinine, Ser: 0.77 mg/dL (ref 0.44–1.00)
GFR calc Af Amer: >60 mL/min (ref >60)
Glucose, Bld: 87 mg/dL (ref 70–99)
Potassium: 3.8 mmol/L (ref 3.5–5.1)
Sodium: 135 mmol/L (ref 135–145)

## 2018-03-04 LAB — CBC
HCT: 26 % — ABNORMAL LOW (ref 36.0–46.0)
Hemoglobin: 8.5 g/dL — ABNORMAL LOW (ref 12.0–15.0)
MCH: 30 pg (ref 26.0–34.0)
MCHC: 32.7 g/dL (ref 30.0–36.0)
MCV: 91.9 fL (ref 80.0–100.0)
PLATELETS: 73 10*3/uL — AB (ref 150–400)
RBC: 2.83 MIL/uL — AB (ref 3.87–5.11)
RDW: 17.2 % — ABNORMAL HIGH (ref 11.5–15.5)
WBC: 9.7 10*3/uL (ref 4.0–10.5)
nRBC: 0.3 % — ABNORMAL HIGH (ref 0.0–0.2)

## 2018-03-04 LAB — URINE CULTURE: Culture: >=100000 — AB

## 2018-03-04 MED ORDER — FUROSEMIDE 10 MG/ML IJ SOLN
40.0000 mg | Freq: Once | INTRAMUSCULAR | Status: AC
  Administered 2018-03-04: 40 mg via INTRAVENOUS
  Filled 2018-03-04: qty 4

## 2018-03-04 MED ORDER — CEPHALEXIN 500 MG PO CAPS
500.0000 mg | ORAL_CAPSULE | Freq: Two times a day (BID) | ORAL | Status: DC
  Administered 2018-03-05 – 2018-03-06 (×3): 500 mg via ORAL
  Filled 2018-03-04 (×3): qty 1

## 2018-03-04 MED ORDER — POTASSIUM CHLORIDE CRYS ER 20 MEQ PO TBCR
40.0000 meq | EXTENDED_RELEASE_TABLET | Freq: Once | ORAL | Status: AC
  Administered 2018-03-04: 40 meq via ORAL
  Filled 2018-03-04: qty 2

## 2018-03-04 NOTE — Progress Notes (Signed)
Tele reported an 8 beat run of VTach. Pt is now sinus at 76. Dr. Eliseo Squires notified. Nurse will continue to monitor. [place]

## 2018-03-04 NOTE — Progress Notes (Signed)
CSW discussed with the patient about if they have made a decision. Patient stated that she has not made a decision yet. She stated that she has not discussed it with her children. Patient referred the CSW to speak with her daughter Kathleen Arias about the choice.   CSW called and spoke with the patients daughter Kathleen Arias. They have not made a decision yet. CSW stated that she needed an answer today so that if they decided to go to a facility she could start insurance authorization. Pt daughter stated that she would contact the CSW with a decision later on in the afternoon.   CSW will continue to follow.   Domenic Schwab, MSW, [place]

## 2018-03-04 NOTE — Progress Notes (Signed)
[facility] PROGRESS NOTE  Kathleen Arias PTW:[id number] DOB: January 21, 1925 DOA: 03/01/2018 PCP: [name], MD  Assessment/Plan: 1. Generalized weakness- likely related to severe hypothyroidism in setting of general deconditioning and UTI. Family reports patient is "house bound". Also not likely that patient has been taking her medications as advised. She has vision issues and memory issues.  1 unit of PRBC on admission. Hg 8.5 down from 9.5 post transfusion. fobt pending. Anemia panel with rbc 2.4 and retic ct 5.4 otherwise within limits of normal PT evaluated and recommended snf. Awaiting family decision and then will need authorization and approval. Social work on board 2. Chronic systolic heart failure does have bilateral lower extremity edema and this am some increased work of breathing with crackles auscultated base right lung. Xray without infiltrate. Reveals stable cardiomegaly. home meds include torsemide. Will provide one time dose IV lasix 40mg . Patient is on ARB and beta-blocker.Dopplers of the lower extremity ordered and prelim report negative for DVT 3. Hypothyroidism see #1. On IV Synthroid for now. 4. Lymphoma being followed by Dr. [name]. 5. Chronic anemia see #1. 6. Mildly elevated renal function. Resolved   7. UTI urinalysis with WBC 11-20, urine culture with greater than 100000 colonies/ml grm neg rods. She is afebrile and non-toxic appearing. Provide rocephin per pharmacy   Code Status: dnr Family Communication: daughter at bedside Disposition Plan: needs placement but patient resisiting   Consultants:  none  Procedures:    Antibiotics:  Rocephin 03/03/18>>>  HPI/Subjective: Pt presented with generalized weakness. Work up revealed severe hypothyroidism, uti and anemia. Transfused 1 unit PRBC's, given IV synthroid, rocephin and hydration.   Objective: Vitals:   03/04/18 0758 03/04/18 1137  BP: 132/69 (!) 115/50  Pulse: 83 66  Resp: (!) 28 (!) 21   Temp: (!) 97.4 F (36.3 C) 98 F (36.7 C)  SpO2: 92% 94%    Intake/Output Summary (Last 24 hours) at 03/04/2018 1319 Last data filed at 03/04/2018 1214 Gross per 24 hour  Intake 525 ml  Output 420 ml  Net 105 ml   Filed Weights   03/01/18 2137 03/02/18 0422  Weight: 49.9 kg 50.1 kg    Exam:   General:  Awake somewhat lethargic, pale no acute distress  Cardiovascular: RRR no mgr no LE edema  Respiratory: mild increased work of breathing. Using accessory muscles. BS with crackles base right side  Abdomen: flat soft +BS no guarding or rebounding  Musculoskeletal: joints without swelling/erythema   Data Reviewed: Basic Metabolic Panel: Recent Labs  Lab 03/01/18 2139 03/02/18 1152 03/02/18 1553 03/03/18 0617 03/04/18 0712  NA 136 134*  --  135 135  K 3.6 3.4*  --  3.8 3.8  CL 98 96*  --  102 99  CO2 24 27  --  27 28  GLUCOSE 125* 108*  --  89 87  BUN 18 16  --  16 16  CREATININE 1.13* 0.94  --  0.81 0.77  CALCIUM 8.6* 8.3*  --  7.7* 7.9*  MG  --   --  2.0  --   --    Liver Function Tests: No results for input(s): AST, ALT, ALKPHOS, BILITOT, PROT, ALBUMIN in the last 168 hours. No results for input(s): LIPASE, AMYLASE in the last 168 hours. No results for input(s): AMMONIA in the last 168 hours. CBC: Recent Labs  Lab 03/01/18 2139 03/02/18 1152 03/03/18 0617 03/04/18 0712  WBC 11.4* 10.1 9.5 9.7  HGB 8.2* 9.5*  9.5* 8.4* 8.5*  HCT  26.0* 28.2*  29.3* 24.9* 26.0*  MCV 93.5 90.4 91.5 91.9  PLT 95* 93* 77* 73*   Cardiac Enzymes: No results for input(s): CKTOTAL, CKMB, CKMBINDEX, TROPONINI in the last 168 hours. BNP (last 3 results) Recent Labs    05/19/17 1319 03/01/18 2337  BNP 2,200.8* 774.8*    ProBNP (last 3 results) Recent Labs    05/31/17 1540  PROBNP 11,281*    CBG: No results for input(s): GLUCAP in the last 168 hours.  Recent Results (from the past 240 hour(s))  Urine culture     Status: Abnormal   Collection Time: 03/02/18  11:17 AM  Result Value Ref Range Status   Specimen Description URINE, CLEAN CATCH  Final   Special Requests   Final    NONE Performed at [facility], [address], [place], [place] [address]    Culture >=100,000 COLONIES/mL ESCHERICHIA COLI (A)  Final   Report Status 03/04/2018 FINAL  Final   Organism ID, Bacteria ESCHERICHIA COLI (A)  Final      Susceptibility   Escherichia coli - MIC*    AMPICILLIN 4 SENSITIVE Sensitive     CEFAZOLIN <=4 SENSITIVE Sensitive     CEFTRIAXONE <=1 SENSITIVE Sensitive     CIPROFLOXACIN <=0.25 SENSITIVE Sensitive     GENTAMICIN <=1 SENSITIVE Sensitive     IMIPENEM <=0.25 SENSITIVE Sensitive     NITROFURANTOIN <=16 SENSITIVE Sensitive     TRIMETH/SULFA <=20 SENSITIVE Sensitive     AMPICILLIN/SULBACTAM <=2 SENSITIVE Sensitive     PIP/TAZO <=4 SENSITIVE Sensitive     Extended ESBL NEGATIVE Sensitive     * >=100,000 COLONIES/mL ESCHERICHIA COLI     Studies: Dg Chest Port 1 View  Result Date: 03/04/2018 CLINICAL DATA:  One-week history of shortness of breath that acutely worsened 3 days ago. Former smoker. Current history of CHF. EXAM: PORTABLE CHEST 1 VIEW COMPARISON:  03/01/2018 and earlier. FINDINGS: Cardiac silhouette markedly enlarged, unchanged. Thoracic aorta tortuous and atherosclerotic, unchanged. Hilar and mediastinal contours otherwise unremarkable. Lungs clear. Pulmonary vascularity normal. No pleural effusions. IMPRESSION: Stable marked cardiomegaly.  No acute cardiopulmonary disease. Electronically Signed   By: Evangeline Dakin M.D.   On: 03/04/2018 09:23    Scheduled Meds: . carvedilol  6.25 mg Oral BID WC  . geriatric multivitamins-minerals  15 mL Oral Daily  . levothyroxine  66 mcg Intravenous Daily  . losartan  25 mg Oral Daily  . lubriderm seriously sensitive   Topical BID  . [START ON 03/05/2018] potassium chloride SA  5 mEq Oral Once per day on Mon Thu  . sodium chloride flush  3 mL Intravenous Once  . torsemide  10 mg Oral  Daily  . vitamin E  400 Units Oral Daily   Continuous Infusions: . cefTRIAXone (ROCEPHIN)  IV 1 g (03/04/18 1313)    Principal Problem:   Hypothyroidism Active Problems:   Anemia, iron deficiency   Generalized weakness   NICM (nonischemic cardiomyopathy) (HCC)   UTI (urinary tract infection)   Splenic marginal zone b-cell lymphoma ([place])    Time spent: 37 minutes    [place] NP  [facility] If 7PM-7AM, please contact night-coverage at [email or web address], password [facility] [place] 03/04/2018, 1:19 PM  LOS: 0 days

## 2018-03-05 DIAGNOSIS — I428 Other cardiomyopathies: Secondary | ICD-10-CM

## 2018-03-05 LAB — CORTISOL: Cortisol, Plasma: 13.7 ug/dL

## 2018-03-05 LAB — CBC
HCT: 26.6 % — ABNORMAL LOW (ref 36.0–46.0)
Hemoglobin: 8.7 g/dL — ABNORMAL LOW (ref 12.0–15.0)
MCH: 30.5 pg (ref 26.0–34.0)
MCHC: 32.7 g/dL (ref 30.0–36.0)
MCV: 93.3 fL (ref 80.0–100.0)
Platelets: 76 10*3/uL — ABNORMAL LOW (ref 150–400)
RBC: 2.85 MIL/uL — ABNORMAL LOW (ref 3.87–5.11)
RDW: 17.2 % — ABNORMAL HIGH (ref 11.5–15.5)
WBC: 9 10*3/uL (ref 4.0–10.5)
nRBC: 0 % (ref 0.0–0.2)

## 2018-03-05 LAB — BASIC METABOLIC PANEL
Anion gap: 11 (ref 5–15)
BUN: 17 mg/dL (ref 8–23)
CO2: 25 mmol/L (ref 22–32)
CREATININE: 0.89 mg/dL (ref 0.44–1.00)
Calcium: 7.7 mg/dL — ABNORMAL LOW (ref 8.9–10.3)
Chloride: 96 mmol/L — ABNORMAL LOW (ref 98–111)
GFR calc Af Amer: >60 mL/min (ref >60)
GFR calc non Af Amer: 57 mL/min — ABNORMAL LOW (ref >60)
Glucose, Bld: 136 mg/dL — ABNORMAL HIGH (ref 70–99)
Potassium: 4.1 mmol/L (ref 3.5–5.1)
Sodium: 132 mmol/L — ABNORMAL LOW (ref 135–145)

## 2018-03-05 MED ORDER — CEPHALEXIN 500 MG PO CAPS: 500.0000 mg | ORAL_CAPSULE | Freq: Two times a day (BID) | ORAL | 0 refills | Status: AC

## 2018-03-05 MED ORDER — SIMETHICONE 80 MG PO CHEW
80.0000 mg | CHEWABLE_TABLET | Freq: Four times a day (QID) | ORAL | Status: DC | PRN
  Administered 2018-03-05: 80 mg via ORAL
  Filled 2018-03-05 (×2): qty 1

## 2018-03-05 NOTE — Progress Notes (Signed)
CSW met with patient and daughter at bedside earlier today to discuss discharge plan. Patient has finally decided today that she would like to pursue SNF, and would like placement at [facility]. CSW confirmed bed availability at [facility]. CSW faxed insurance authorization request to [facility].  CSW to follow.  Laveda Abbe, [place] Clinical Social Worker [phone]

## 2018-03-05 NOTE — Discharge Summary (Addendum)
Physician Discharge Summary  Kathleen Arias TFT:[id number] DOB: 03/13/1925 DOA: 03/01/2018  PCP: [name], MD  Admit date: 03/01/2018 Discharge date: 03/06/18  Admitted From: Home  Disposition: SNF  Recommendations for Outpatient Follow-up:  1. Follow up with PCP in 1-2 weeks 2. Please obtain BMP/CBC in one week your next doctors visit.    Discharge Condition: Stable CODE STATUS: DNR Diet recommendation: Cardiac  Brief/Interim Summary: 85 year old female with history of lymphoma followed by oncology, chronic systolic heart failure ejection fraction 20-25%, chronic anemia, hypothyroidism came to the hospital with complains of increasing weakness and fatigue.  Upon admission she was found to have urinary tract infection and elevated TSH at 38.  She was hemodynamically stable.  Started on IV Synthroid and antibiotics.  She was also hydrated.  Over the course of 2-3 days her symptoms improved. Patient and the family admitted the patient has been noncompliant with her Synthroid medication and occasionally skips it.  I had an extensive discussion with them and explained them the importance of that medication and not to skip it.  Addendum 03/06/2018 -No further change.  Patient can be transferred today to [facility].   Discharge Diagnoses:  Principal Problem:   Hypothyroidism Active Problems:   Anemia, iron deficiency   Splenic marginal zone b-cell lymphoma (HCC)   Generalized weakness   NICM (nonischemic cardiomyopathy) (HCC)   UTI (urinary tract infection)   Weakness  Generalized weakness, improved.  Multifactorial Uncontrolled severe hypothyroidism Urinary tract infection without hematuria, with E. coli - Cortisol levels appears to be within normal limits - Hemodynamically stable.  We will transition patient to oral Synthroid -Will need outpatient repeat lab work in about 2-4 weeks when she follows up with the primary care provider. -3 more days of oral Keflex. - Family and  patient does not want to go to skilled nursing facility and would rather have home health, home health orders have been placed.  But then later changed their mind again to skilled nursing facility  Chronic systolic congestive heart failure with bilateral lower extremity edema - She is more or less close to her baseline.  Appears to be euvolemic.  Received one-time of IV Lasix during the hospitalization.  Lower extremity Dopplers is negative for DVT.  I plan on resuming her home medications.  Hypothyroidism - Advised to remain compliant with her oral Synthroid.  Advised to take it every morning around the same time before meals.  History of lymphoma -Followed by outpatient oncology.  Dr. [name]  History of chronic anemia -Hemoglobin slightly low but this is around her baseline.  Received 1 unit of PRBC transfusion.  Patient is DNR Daughter at bedside Medically stable for discharge.  Consultations:  None  Subjective: Feels better, no complaints.  Discharge Exam: Vitals:   03/05/18 1159 03/05/18 1544  BP: 124/60 (!) 138/59  Pulse: 67 70  Resp: 17 (!) 22  Temp: 98 F (36.7 C) 97.9 F (36.6 C)  SpO2: 95% 96%   Vitals:   03/05/18 0800 03/05/18 0852 03/05/18 1159 03/05/18 1544  BP:  (!) 129/59 124/60 (!) 138/59  Pulse:  72 67 70  Resp: (!) 26 20 17  (!) 22  Temp:  97.8 F (36.6 C) 98 F (36.7 C) 97.9 F (36.6 C)  TempSrc:  Oral Oral Oral  SpO2:  96% 95% 96%  Weight:      Height:        General: Pt is alert, awake, not in acute distress Cardiovascular: RRR, S1/S2 +, no rubs, no  gallops Respiratory: CTA bilaterally, no wheezing, no rhonchi Abdominal: Soft, NT, ND, bowel sounds + Extremities: no edema, no cyanosis Overall her strength is 4/5 in all 4 extremities  Discharge Instructions   Allergies as of 03/05/2018   No Known Allergies     Medication List    TAKE these medications   B COMPLEX-C-CALCIUM PO Take 1 tablet by mouth daily.   carvedilol 6.25 MG  tablet Commonly known as:  COREG Take 6.25 mg by mouth 2 (two) times daily with a meal.   cephALEXin 500 MG capsule Commonly known as:  KEFLEX Take 1 capsule (500 mg total) by mouth every 12 (twelve) hours for 2 days.   Geritol Liqd Take 5-15 mLs by mouth daily.   ibuprofen 200 MG tablet Commonly known as:  ADVIL,MOTRIN Take 200 mg by mouth daily as needed (for pain).   iron polysaccharides 150 MG capsule Commonly known as:  NIFEREX Take 1 capsule (150 mg total) by mouth daily.   levothyroxine 125 MCG tablet Commonly known as:  SYNTHROID, LEVOTHROID Take 1 tablet (125 mcg total) by mouth daily before breakfast. What changed:  Another medication with the same name was removed. Continue taking this medication, and follow the directions you see here.   losartan 25 MG tablet Commonly known as:  COZAAR Take 1 tablet (25 mg total) by mouth daily.   OVER THE COUNTER MEDICATION Take 2 tablets by mouth See admin instructions. VITAFUSION gummyvites: Chew 2 gummies by mouth once a day   potassium chloride SA 20 MEQ tablet Commonly known as:  K-DUR,KLOR-CON Take 1 tablet (20 mEq total) by mouth daily. What changed:    how much to take  when to take this   PRESERVISION/LUTEIN PO Take 1 capsule by mouth See admin instructions. Take 1 capsule by mouth one to two times a day   torsemide 20 MG tablet Commonly known as:  DEMADEX Take 0.5 tablets (10 mg total) by mouth daily. What changed:  how much to take   Vitamin E 400 units Tabs Take 400 Units by mouth daily.       No Known Allergies  You were cared for by a hospitalist during your hospital stay. If you have any questions about your discharge medications or the care you received while you were in the hospital after you are discharged, you can call the unit and asked to speak with the hospitalist on call if the hospitalist that took care of you is not available. Once you are discharged, your primary care physician will handle  any further medical issues. Please note that no refills for any discharge medications will be authorized once you are discharged, as it is imperative that you return to your primary care physician (or establish a relationship with a primary care physician if you do not have one) for your aftercare needs so that they can reassess your need for medications and monitor your lab values.   Procedures/Studies: Dg Chest 2 View  Result Date: 03/01/2018 CLINICAL DATA:  Generalized weakness EXAM: CHEST - 2 VIEW COMPARISON:  05/19/2017 FINDINGS: Cardiac shadow remains enlarged. Tortuous thoracic aorta is noted with mild calcifications. The lungs are well aerated bilaterally. No focal infiltrate or sizable effusion is seen. Chronic compression deformity of the lower thoracic spine is noted. IMPRESSION: Stable cardiomegaly without acute abnormality. Electronically Signed   By: Inez Catalina M.D.   On: 03/01/2018 23:28   Dg Chest Port 1 View  Result Date: 03/04/2018 CLINICAL DATA:  One-week history of  shortness of breath that acutely worsened 3 days ago. Former smoker. Current history of CHF. EXAM: PORTABLE CHEST 1 VIEW COMPARISON:  03/01/2018 and earlier. FINDINGS: Cardiac silhouette markedly enlarged, unchanged. Thoracic aorta tortuous and atherosclerotic, unchanged. Hilar and mediastinal contours otherwise unremarkable. Lungs clear. Pulmonary vascularity normal. No pleural effusions. IMPRESSION: Stable marked cardiomegaly.  No acute cardiopulmonary disease. Electronically Signed   By: Evangeline Dakin M.D.   On: 03/04/2018 09:23   Vas [place] Lower Extremity Venous (dvt)  Result Date: 03/04/2018  Lower Venous Study Indications: Swelling.  Performing Technologist: June Leap RDMS, RVT  Examination Guidelines: A complete evaluation includes B-mode imaging, spectral Doppler, color Doppler, and power Doppler as needed of all accessible portions of each vessel. Bilateral testing is considered an integral part of a complete  examination. Limited examinations for reoccurring indications may be performed as noted.  Right Venous Findings: +---------+---------------+---------+-----------+----------+-------+          CompressibilityPhasicitySpontaneityPropertiesSummary +---------+---------------+---------+-----------+----------+-------+ CFV      Full           Yes      Yes                          +---------+---------------+---------+-----------+----------+-------+ SFJ      Full                                                 +---------+---------------+---------+-----------+----------+-------+ FV Prox  Full                                                 +---------+---------------+---------+-----------+----------+-------+ FV Mid   Full                                                 +---------+---------------+---------+-----------+----------+-------+ FV DistalFull                                                 +---------+---------------+---------+-----------+----------+-------+ PFV      Full                                                 +---------+---------------+---------+-----------+----------+-------+ POP      Full           Yes      Yes                          +---------+---------------+---------+-----------+----------+-------+ PTV      Full                                                 +---------+---------------+---------+-----------+----------+-------+ PERO     Full                                                 +---------+---------------+---------+-----------+----------+-------+  Left Venous Findings: +---------+---------------+---------+-----------+----------+-------+          CompressibilityPhasicitySpontaneityPropertiesSummary +---------+---------------+---------+-----------+----------+-------+ CFV      Full           Yes      Yes                          +---------+---------------+---------+-----------+----------+-------+ SFJ      Full                                                  +---------+---------------+---------+-----------+----------+-------+ FV Prox  Full                                                 +---------+---------------+---------+-----------+----------+-------+ FV Mid   Full                                                 +---------+---------------+---------+-----------+----------+-------+ FV DistalFull                                                 +---------+---------------+---------+-----------+----------+-------+ PFV      Full                                                 +---------+---------------+---------+-----------+----------+-------+ POP      Full           Yes      Yes                          +---------+---------------+---------+-----------+----------+-------+ PTV      Full                                                 +---------+---------------+---------+-----------+----------+-------+ PERO     Full                                                 +---------+---------------+---------+-----------+----------+-------+    Summary: Right: There is no evidence of deep vein thrombosis in the lower extremity. No cystic structure found in the popliteal fossa. Left: There is no evidence of deep vein thrombosis in the lower extremity. No cystic structure found in the popliteal fossa.  *See table(s) above for measurements and observations. Electronically signed by Ruta Hinds MD on 03/04/2018 at 2:24:53 PM.    Final      The results of significant diagnostics from this hospitalization (including imaging, microbiology, ancillary and laboratory) are listed below for reference.     Microbiology: Recent Results (from the past  240 hour(s))  Urine culture     Status: Abnormal   Collection Time: 03/02/18 11:17 AM  Result Value Ref Range Status   Specimen Description URINE, CLEAN CATCH  Final   Special Requests   Final    NONE Performed at [facility],  [address], [place], [place] [address]    Culture >=100,000 COLONIES/mL ESCHERICHIA COLI (A)  Final   Report Status 03/04/2018 FINAL  Final   Organism ID, Bacteria ESCHERICHIA COLI (A)  Final      Susceptibility   Escherichia coli - MIC*    AMPICILLIN 4 SENSITIVE Sensitive     CEFAZOLIN <=4 SENSITIVE Sensitive     CEFTRIAXONE <=1 SENSITIVE Sensitive     CIPROFLOXACIN <=0.25 SENSITIVE Sensitive     GENTAMICIN <=1 SENSITIVE Sensitive     IMIPENEM <=0.25 SENSITIVE Sensitive     NITROFURANTOIN <=16 SENSITIVE Sensitive     TRIMETH/SULFA <=20 SENSITIVE Sensitive     AMPICILLIN/SULBACTAM <=2 SENSITIVE Sensitive     PIP/TAZO <=4 SENSITIVE Sensitive     Extended ESBL NEGATIVE Sensitive     * >=100,000 COLONIES/mL ESCHERICHIA COLI     Labs: BNP (last 3 results) Recent Labs    05/19/17 1319 03/01/18 2337  BNP 2,200.8* 536.6*   Basic Metabolic Panel: Recent Labs  Lab 03/01/18 2139 03/02/18 1152 03/02/18 1553 03/03/18 0617 03/04/18 0712 03/05/18 0559  NA 136 134*  --  135 135 132*  K 3.6 3.4*  --  3.8 3.8 4.1  CL 98 96*  --  102 99 96*  CO2 24 27  --  27 28 25   GLUCOSE 125* 108*  --  89 87 136*  BUN 18 16  --  16 16 17   CREATININE 1.13* 0.94  --  0.81 0.77 0.89  CALCIUM 8.6* 8.3*  --  7.7* 7.9* 7.7*  MG  --   --  2.0  --   --   --    Liver Function Tests: No results for input(s): AST, ALT, ALKPHOS, BILITOT, PROT, ALBUMIN in the last 168 hours. No results for input(s): LIPASE, AMYLASE in the last 168 hours. No results for input(s): AMMONIA in the last 168 hours. CBC: Recent Labs  Lab 03/01/18 2139 03/02/18 1152 03/03/18 0617 03/04/18 0712 03/05/18 0559  WBC 11.4* 10.1 9.5 9.7 9.0  HGB 8.2* 9.5*  9.5* 8.4* 8.5* 8.7*  HCT 26.0* 28.2*  29.3* 24.9* 26.0* 26.6*  MCV 93.5 90.4 91.5 91.9 93.3  PLT 95* 93* 77* 73* 76*   Cardiac Enzymes: No results for input(s): CKTOTAL, CKMB, CKMBINDEX, TROPONINI in the last 168 hours. BNP: Invalid input(s): POCBNP CBG: No results  for input(s): GLUCAP in the last 168 hours. D-Dimer No results for input(s): DDIMER in the last 72 hours. Hgb A1c No results for input(s): HGBA1C in the last 72 hours. Lipid Profile No results for input(s): CHOL, HDL, LDLCALC, TRIG, CHOLHDL, LDLDIRECT in the last 72 hours. Thyroid function studies No results for input(s): TSH, T4TOTAL, T3FREE, THYROIDAB in the last 72 hours.  Invalid input(s): FREET3 Anemia work up No results for input(s): VITAMINB12, FOLATE, FERRITIN, TIBC, IRON, RETICCTPCT in the last 72 hours. Urinalysis    Component Value Date/Time   COLORURINE STRAW (A) 03/02/2018 1128   APPEARANCEUR CLEAR 03/02/2018 1128   LABSPEC 1.005 03/02/2018 1128   PHURINE 7.0 03/02/2018 1128   [place] 03/02/2018 1128   HGBUR NEGATIVE 03/02/2018 [place] 03/02/2018 1128   [place] 03/02/2018 1128  PROTEINUR NEGATIVE 03/02/2018 1128   UROBILINOGEN 0.2 08/21/2010 1229   NITRITE NEGATIVE 03/02/2018 1128   LEUKOCYTESUR NEGATIVE 03/02/2018 1128   Sepsis Labs Invalid input(s): PROCALCITONIN,  WBC,  LACTICIDVEN Microbiology Recent Results (from the past 240 hour(s))  Urine culture     Status: Abnormal   Collection Time: 03/02/18 11:17 AM  Result Value Ref Range Status   Specimen Description URINE, CLEAN CATCH  Final   Special Requests   Final    NONE Performed at [facility], [place] [address], [place], [place] [address]    Culture >=100,000 COLONIES/mL ESCHERICHIA COLI (A)  Final   Report Status 03/04/2018 FINAL  Final   Organism ID, Bacteria ESCHERICHIA COLI (A)  Final      Susceptibility   Escherichia coli - MIC*    AMPICILLIN 4 SENSITIVE Sensitive     CEFAZOLIN <=4 SENSITIVE Sensitive     CEFTRIAXONE <=1 SENSITIVE Sensitive     CIPROFLOXACIN <=0.25 SENSITIVE Sensitive     GENTAMICIN <=1 SENSITIVE Sensitive     IMIPENEM <=0.25 SENSITIVE Sensitive     NITROFURANTOIN <=16 SENSITIVE Sensitive     TRIMETH/SULFA <=20 SENSITIVE  Sensitive     AMPICILLIN/SULBACTAM <=2 SENSITIVE Sensitive     PIP/TAZO <=4 SENSITIVE Sensitive     Extended ESBL NEGATIVE Sensitive     * >=100,000 COLONIES/mL ESCHERICHIA COLI     Time coordinating discharge:  I have spent 35 minutes face to face with the patient and on the ward discussing the patients care, assessment, plan and disposition with other care givers. >50% of the time was devoted counseling the patient about the risks and benefits of treatment/Discharge disposition and coordinating care.   SIGNED:   Damita Lack, MD  [facility] 03/05/2018, 6:25 PM   If 7PM-7AM, please contact night-coverage [email or web address]

## 2018-03-05 NOTE — Progress Notes (Signed)
Physical Therapy Treatment Patient Details Name: Kathleen Arias MRN: [id number] DOB: 30-Jul-1925 Today's Date: 03/05/2018    History of Present Illness Pt is a 85 y/o female admitted secondary to generalized weakness and inability to walk. Pt found to have severe hypothyroidism. PMH including but not limited to cancer, CHF and HTN.     PT Comments    Pt showing slow improvements in strength and stamina.  Emphasis on transfers and transfer safety along with gait stability and stamina.    Follow Up Recommendations  SNF;Other (comment)     Equipment Recommendations  3in1 (PT)    Recommendations for Other Services       Precautions / Restrictions Precautions Precautions: Fall    Mobility  Bed Mobility               General bed mobility comments: OOB on arrival  Transfers Overall transfer level: Needs assistance Equipment used: Rolling walker (2 wheeled) Transfers: Sit to/from Stand Sit to Stand: Min guard         General transfer comment: cues for hand placement, slow to move and process. No assist needed  Ambulation/Gait Ambulation/Gait assistance: Min assist Gait Distance (Feet): 60 Feet Assistive device: Rolling walker (2 wheeled) Gait Pattern/deviations: Step-through pattern   Gait velocity interpretation: <1.8 ft/sec, indicate of risk for recurrent falls General Gait Details: mildly unsteady and staying outside of the RW.  cues to stay inside the RW.  Encouragement needed to get pt to mobilize an appropriate amount based on perceived exertion.   Stairs             Wheelchair Mobility    Modified Rankin (Stroke Patients Only)       Balance Overall balance assessment: Needs assistance Sitting-balance support: Feet supported Sitting balance-Leahy Scale: Good       Standing balance-Leahy Scale: Poor Standing balance comment: reliant on the AD or assist                            Cognition Arousal/Alertness:  Awake/alert Behavior During Therapy: Anxious;WFL for tasks assessed/performed   Area of Impairment: Safety/judgement;Problem solving                         Safety/Judgement: Decreased awareness of safety   Problem Solving: Slow processing;Requires verbal cues        Exercises      General Comments        Pertinent Vitals/Pain Pain Assessment: Faces Faces Pain Scale: No hurt    Home Living                      Prior Function            PT Goals (current goals can now be found in the care plan section) Acute Rehab PT Goals Patient Stated Goal: build up strength and return home PT Goal Formulation: With patient/family Time For Goal Achievement: 03/16/18 Potential to Achieve Goals: Fair Progress towards PT goals: Progressing toward goals    Frequency    Min 2X/week      PT Plan Current plan remains appropriate    Co-evaluation              AM-PAC PT "6 Clicks" Mobility   Outcome Measure  Help needed turning from your back to your side while in a flat bed without using bedrails?: A Little Help needed moving from lying on your back to  sitting on the side of a flat bed without using bedrails?: A Little Help needed moving to and from a bed to a chair (including a wheelchair)?: A Little Help needed standing up from a chair using your arms (e.g., wheelchair or bedside chair)?: A Little Help needed to walk in hospital room?: A Little Help needed climbing 3-5 steps with a railing? : A Little 6 Click Score: 18    End of Session   Activity Tolerance: Patient limited by fatigue Patient left: in chair;with call bell/phone within reach;with chair alarm set;with family/visitor present Nurse Communication: Mobility status PT Visit Diagnosis: Other abnormalities of gait and mobility (R26.89);Muscle weakness (generalized) (M62.81)     Time: 7943-2761 PT Time Calculation (min) (ACUTE ONLY): 17 min  Charges:  $Gait Training: 8-22 mins                      03/05/2018  Donnella Sham, PT Acute Rehabilitation Services [phone]  (pager) [phone]  (office)   Tessie Fass Jagger Beahm 03/05/2018, 12:58 PM

## 2018-03-06 NOTE — Progress Notes (Addendum)
Patient seen and examined at the bedside.  No complaints this morning.  Currently awaiting placement and insurance authorization. Continue her oral antibiotics for urinary tract infection until 03/08/2018. Daughter is at the bedside.  States her mother has been doing better.  Lab work and vital signs reviewed. Cortisol is within normal limits.  Call with further questions as needed.  Otherwise refer to discharge summary from 03/05/2018 for further details. DNR form signed and placed in the chart  Gerlean Ren MD [facility]

## 2018-03-06 NOTE — Clinical Social Work Placement (Signed)
Nurse to call report to [phone], Room 107     CLINICAL SOCIAL WORK PLACEMENT  NOTE  Date:  03/06/2018  Patient Details  Name: Kathleen Arias MRN: [id number] Date of Birth: Mar 12, 1925  Clinical Social Work is seeking post-discharge placement for this patient at the [place] level of care (*CSW will initial, date and re-position this form in  chart as items are completed):  Yes   Patient/family provided with [facility] Work Department's list of facilities offering this level of care within the geographic area requested by the patient (or if unable, by the patient's family).  Yes   Patient/family informed of their freedom to choose among providers that offer the needed level of care, that participate in Medicare, Medicaid or managed care program needed by the patient, have an available bed and are willing to accept the patient.  Yes   Patient/family informed of [facility]'s ownership interest in [facility] and [facility], as well as of the fact that they are under no obligation to receive care at these facilities.  PASRR submitted to EDS on       PASRR number received on       Existing PASRR number confirmed on       FL2 transmitted to all facilities in geographic area requested by pt/family on       FL2 transmitted to all facilities within larger geographic area on       Patient informed that his/her managed care company has contracts with or will negotiate with certain facilities, including the following:        Yes   Patient/family informed of bed offers received.  Patient chooses bed at [facility] recommends and patient chooses bed at      Patient to be transferred to [facility] on 03/06/18.  Patient to be transferred to facility by Family car     Patient family notified on 03/06/18 of transfer.  Name of family member notified:  Self, daughter at bedside     PHYSICIAN        Additional Comment:    _______________________________________________ Geralynn Ochs, LCSW 03/06/2018, 10:51 AM

## 2018-03-07 ENCOUNTER — Encounter: Payer: Self-pay | Admitting: Internal Medicine

## 2018-03-07 ENCOUNTER — Non-Acute Institutional Stay (SKILLED_NURSING_FACILITY): Payer: Medicare Other | Admitting: Internal Medicine

## 2018-03-07 DIAGNOSIS — E039 Hypothyroidism, unspecified: Secondary | ICD-10-CM

## 2018-03-07 DIAGNOSIS — D509 Iron deficiency anemia, unspecified: Secondary | ICD-10-CM

## 2018-03-07 DIAGNOSIS — N3000 Acute cystitis without hematuria: Secondary | ICD-10-CM

## 2018-03-07 DIAGNOSIS — C8307 Small cell B-cell lymphoma, spleen: Secondary | ICD-10-CM | POA: Diagnosis not present

## 2018-03-07 NOTE — Assessment & Plan Note (Addendum)
Recheck TSH after 6-8 weeks of replacement  Monitor for any dysrhythmias because of the relatively high dose of L-thyroxine.

## 2018-03-07 NOTE — Assessment & Plan Note (Signed)
Completing full course of Keflex for E. coli UTI.

## 2018-03-07 NOTE — Assessment & Plan Note (Signed)
Notify Dr.Kale of her admission to the SNF and arrange for follow-up

## 2018-03-07 NOTE — Progress Notes (Signed)
NURSING HOME LOCATION:  [facility] ROOM NUMBER:  [id number]  CODE STATUS:  DNR  PCP:  [name], MD  [address] [place] [address]  This is a comprehensive admission note to [facility] performed on this date less than 30 days from date of admission. Included are preadmission medical/surgical history; reconciled medication list; family history; social history and comprehensive review of systems.   Corrections and additions to the records were documented. Comprehensive physical exam was also performed. Additionally a clinical summary was entered for each active diagnosis pertinent to this admission in the Problem List to enhance continuity of care.  HPI: Patient was hospitalized 2/13-2/18/2020, presenting with increasing weakness and fatigue in the context of a history of lymphoma and chronic systolic heart failure. E Coli urinary  tract infection was diagnosed and she was found to have a TSH of 38.  IV Synthroid and antibiotics were initiated.  Symptoms improved over 2-3 days.  Hospital record stated patient and family confirmed that the patient has been noncompliant with her L-thyroxine medications, occasionally "skipping it".  Today her daughter and she validate that she was usually compliant but the time of ingestion would vary markably and was not routinely fasting in the a.m. Discharge lab abnormalities included sodium of 132, calcium 7.7, and normochromic/normocytic anemia with hemoglobin 8.7 and hematocrit 26.6.  She did receive 1 unit of packed cells while hospitalized. She was transferred to the SNF for PT/OT.  She was to complete 3 additional days of Keflex for the E. coli UTI.  Past medical and surgical history: Dr Kale,Oncology follows the patient for splenic marginal zone B-cell lymphoma and iron deficiency anemia.  Other diagnoses include essential hypertension, nonischemic cardiomyopathy with ejection fraction 20-25%.  She has had multiple orthopedic  surgeries/seizures.  Social history: Former smoker, nondrinker.  Family history: Reviewed, noncontributory because of age.   Review of systems: She states that she is gaining strength and her daughter validates this.  Her daughter stated that she seems much more stable using a walker.  She has lost 8 pounds over the last 3 months.  She was unable to keep her most recent F/U appointment with Dr. [name] due to the hospitalization.  She describes intermittent "gas pain" in the abdomen. Her abdomen tends to be tender and she is fearful of anyone examining the abdomen, particularly in the area of the spleen.  Constitutional: No fever  Eyes: No redness, discharge, pain, vision change ENT/mouth: No nasal congestion, purulent discharge, earache, change in hearing, sore throat  Cardiovascular: No chest pain, palpitations, paroxysmal nocturnal dyspnea, claudication, edema  Respiratory: No cough, sputum production, hemoptysis, DOE, significant snoring, apnea Gastrointestinal: No heartburn, dysphagia, nausea /vomiting, rectal bleeding, melena, change in bowels Genitourinary: No dysuria, hematuria, pyuria, incontinence, nocturia Musculoskeletal: No joint stiffness, joint swelling, weakness, pain Dermatologic: No rash, pruritus, change in appearance of skin Neurologic: No dizziness, headache, syncope, seizures, numbness, tingling Psychiatric: No significant anxiety, depression, insomnia, anorexia Endocrine: No change in hair/skin/nails, excessive thirst, excessive hunger, excessive urination  Hematologic/lymphatic: No significant bruising, lymphadenopathy, abnormal bleeding Allergy/immunology: No itchy/watery eyes, significant sneezing, urticaria, angioedema  Physical exam:  Pertinent or positive findings: Hair is disheveled.  She appears pale.  She is slightly hoarse.  There is thinning of the eyebrows.  A grade 1/2 systolic murmur is present.  Abdomen is protuberant.  She describes tenderness in both  lateral upper and lower quadrants to palpation.  Posterior tibial pulses are slightly decreased.  She is weak  to opposition in all extremities.  Interosseous wasting is present.  She has isolated DIP osteoarthritic changes.  General appearance: no acute distress, increased work of breathing is present.   Lymphatic: No lymphadenopathy about the head, neck, axilla. Eyes: No conjunctival inflammation or lid edema is present. There is no scleral icterus. Ears:  External ear exam shows no significant lesions or deformities.   Nose:  External nasal examination shows no deformity or inflammation. Nasal mucosa are pink and moist without lesions, exudates Oral exam: Lips and gums are healthy appearing.There is no oropharyngeal erythema or exudate. Neck:  No thyromegaly, masses, tenderness noted.    Heart:  No gallop, click, rub.  Lungs: Chest clear to auscultation without wheezes, rhonchi, rales, rubs. Abdomen: Bowel sounds are normal.   GU: Deferred  Extremities:  No cyanosis, clubbing, edema. Neurologic exam:  Balance, Rhomberg, finger to nose testing could not be completed due to clinical state Deep tendon reflexes are equal Skin: Warm & dry w/o tenting. No significant lesions or rash.  See clinical summary under each active problem in the Problem List with associated updated therapeutic plan

## 2018-03-07 NOTE — Assessment & Plan Note (Signed)
Normochromic, normocytic anemia relatively stable at discharge Hemoglobin 8.7/hematocrit 26.6

## 2018-03-07 NOTE — Patient Instructions (Signed)
See assessment and plan under each diagnosis in the problem list and acutely for this visit 

## 2018-03-13 NOTE — Progress Notes (Signed)
Marland Kitchen    HEMATOLOGY/ONCOLOGY CLINIC NOTE  Date of Service: 03/14/2018   Patient Care Team: [name], MD as PCP - General (Family Medicine) [name], MD as PCP - Cardiology (Cardiology)  CHIEF COMPLAINTS/PURPOSE OF CONSULTATION:  F/u for splenic marginal zone lymphoma  HISTORY OF PRESENTING ILLNESS:   Eufelia Veno is a wonderful 85 y.o. female who has been referred to [place] by Dr .Redmond Pulling, Jama Flavors, MD for evaluation and management of anemia, leukocytosis and thrombocytopenia.  Patient is a very pleasant female who is here with her daughter. She has a history of hypertension, chronic systolic CHF [ejection fraction of 25-35% based on recent cardiac catheterization on 01/13/2016, echo done showed ejection fraction of 40-45%, nonischemic cardiomyopathy], iron deficiency,  Patient presented to his primary care physician with some dyspnea on exertion which is primarily related to her systolic CHF but she was also noted to have recent blood counts with her primary care physician on 03/15/2016 which showed anemia with a hemoglobin of 9.7 and a hematocrit of 79, platelet count of 137k , WBC count of 11.7k with 7.6k lymphocytes.  Iron studies were done which showed a ferritin of 84 and an iron saturation of 9%. She has been on oral iron replacement by her primary care physician and notes significant constipation and GI intolerance. She reports that she had a FOBT in 12/2015  which was negative.   Patient notes significant fatigue and dyspnea on exertion. Notes weight loss of about 15 pounds since September 2017. Weight is down from 142 to 124lbs.   Patient denies any enlarged lymph nodes, no night sweats.  INTERVAL HISTORY   Mrs. Hannen is here for follow-up for management of splenic marginal zone lymphoma. The patient's last visit with [place] was on 01/11/18. She is accompanied today by her daughters.  The pt reports that she has been feeling tired in the interim. She was hospitalized for 5  days, was discharged on 03/06/18, and had a UTI, low thyroid levels, and received a blood transfusion as well. She is now at rehab, and notes that she is expecting to be there for roughly 3 weeks.  The pt notes that she was not able to sleep last night, and is feeling tired today. She is not sure why she was not able to sleep last night.   The pt notes that she wasn't eating much last week, and experienced some abdominal pains this morning which she thinks could have been related to gas. She continues moving her bowels regularly. She denies any current abdominal pains.  The pt denies having any fevers, chills, or night sweats.  Lab results today (03/14/18) of CBC w/diff, Reticulocytes, and CMP is as follows: all values are WNL except for RBC at 2.78, HGB at 8.5, HCT at 25.2, RDW at 16.5, PLT at 88k, Lymphs abs at 4.6k, Retic ct pct at 5.3%, Immature retic fract at 18.8%, Sodium at 132, Chloride at 96, Calcium at 8.3, Albumin at 3.0, GFR at 56. 03/14/18 LDH is at 533  On review of systems, pt reports moving her bowels regularly, feeling tired, and denies fevers, chills, night sweats, abdominal pains, leg swelling, and any other symptoms.    MEDICAL HISTORY:  Past Medical History:  Diagnosis Date  . Anemia   . Anginal pain ([place])   . Arthritis   . Cancer ([place])   . CHF (congestive heart failure) ([place])   . Dyspnea   . Dysrhythmia   . Hypertension   . Hypothyroidism   .  Thyroid disease   Chronic systolic CHF Iron deficiency   SURGICAL HISTORY: Past Surgical History:  Procedure Laterality Date  . CARDIAC CATHETERIZATION N/A 01/07/2016   Procedure: Left Heart Cath and Coronary Angiography;  Surgeon: Jettie Booze, MD;  Location: [facility];  Service: Cardiovascular;  Laterality: N/A;  . CATARACT EXTRACTION, BILATERAL    . FRACTURE SURGERY    . HIP SURGERY    . JOINT REPLACEMENT    . TONSILLECTOMY      SOCIAL HISTORY: Social History   Socioeconomic History  . Marital  status: Single    Spouse name: Not on file  . Number of children: Not on file  . Years of education: Not on file  . Highest education level: Not on file  Occupational History  . Not on file  Social Needs  . Financial resource strain: Not on file  . Food insecurity:    Worry: Not on file    Inability: Not on file  . Transportation needs:    Medical: Not on file    Non-medical: Not on file  Tobacco Use  . Smoking status: Former Research scientist (life sciences)  . Smokeless tobacco: Never Used  Substance and Sexual Activity  . Alcohol use: No  . Drug use: No  . Sexual activity: Not on file  Lifestyle  . Physical activity:    Days per week: Not on file    Minutes per session: Not on file  . Stress: Not on file  Relationships  . Social connections:    Talks on phone: Not on file    Gets together: Not on file    Attends religious service: Not on file    Active member of club or organization: Not on file    Attends meetings of clubs or organizations: Not on file    Relationship status: Not on file  . Intimate partner violence:    Fear of current or ex partner: Not on file    Emotionally abused: Not on file    Physically abused: Not on file    Forced sexual activity: Not on file  Other Topics Concern  . Not on file  Social History Narrative  . Not on file    FAMILY HISTORY: Family History  Problem Relation Age of Onset  . Heart disease Mother     ALLERGIES:  has No Known Allergies.  MEDICATIONS:  Current Outpatient Medications  Medication Sig Dispense Refill  . acetaminophen (TYLENOL) 500 MG tablet Take 500 mg by mouth every 4 (four) hours as needed for mild pain or moderate pain.    . B COMPLEX-C-CALCIUM PO Take 1 tablet by mouth daily.    . carvedilol (COREG) 6.25 MG tablet Take 6.25 mg by mouth 2 (two) times daily with a meal.     . iron polysaccharides (NIFEREX) 150 MG capsule Take 1 capsule (150 mg total) by mouth daily. 100 capsule 1  . Iron-Vitamins (GERITOL) LIQD Take 10 mLs by  mouth daily.     Marland Kitchen levothyroxine (SYNTHROID, LEVOTHROID) 125 MCG tablet Take 1 tablet (125 mcg total) by mouth daily before breakfast. 30 tablet 0  . losartan (COZAAR) 25 MG tablet Take 1 tablet (25 mg total) by mouth daily. 90 tablet 1  . Multiple Vitamins-Minerals (PRESERVISION/LUTEIN PO) Take 1 capsule by mouth See admin instructions. Take 1 capsule by mouth one to two times a day    . potassium chloride SA (K-DUR,KLOR-CON) 20 MEQ tablet Take 1 tablet (20 mEq total) by mouth daily. [place]  tablet 3  . torsemide (DEMADEX) 10 MG tablet Take 10 mg by mouth daily.     No current facility-administered medications for this visit.     REVIEW OF SYSTEMS:    A 10+ POINT REVIEW OF SYSTEMS WAS OBTAINED including neurology, dermatology, psychiatry, cardiac, respiratory, lymph, extremities, GI, GU, Musculoskeletal, constitutional, breasts, reproductive, HEENT.  All pertinent positives are noted in the HPI.  All others are negative.   PHYSICAL EXAMINATION:  ECOG PERFORMANCE STATUS: 2 - Symptomatic, <50% confined to bed  Vitals:   03/14/18 1430  BP: (!) 106/51  Pulse: 67  Resp: 18  Temp: 98.3 F (36.8 C)  TempSrc: Oral  SpO2: 92%  Weight: 113 lb (51.3 kg)  Height: 5\' 3"  (1.6 m)   Body mass index is 20.02 kg/m.  GENERAL:alert, in no acute distress and comfortable SKIN: no acute rashes, no significant lesions EYES: conjunctiva are pink and non-injected, sclera anicteric OROPHARYNX: MMM, no exudates, no oropharyngeal erythema or ulceration NECK: supple, no JVD LYMPH:  no palpable lymphadenopathy in the cervical, axillary or inguinal regions LUNGS: clear to auscultation b/l with normal respiratory effort HEART: regular rate & rhythm ABDOMEN:  normoactive bowel sounds , non tender, not distended. Palpable splenomegaly 4 fingerbreadths beneath the left costal margin Extremity: no pedal edema PSYCH: alert & oriented x 3 with fluent speech NEURO: no focal motor/sensory deficits   LABORATORY DATA:   I have reviewed the data as listed  . CBC Latest Ref Rng & Units 03/14/2018 03/05/2018 03/04/2018  WBC 4.0 - 10.5 K/uL 8.6 9.0 9.7  Hemoglobin 12.0 - 15.0 g/dL 8.5(L) 8.7(L) 8.5(L)  Hematocrit 36.0 - 46.0 % 25.2(L) 26.6(L) 26.0(L)  Platelets 150 - 400 K/uL 88(L) 76(L) 73(L)    . CBC    Component Value Date/Time   WBC 8.6 03/14/2018 1359   RBC 2.78 (L) 03/14/2018 1359   RBC 2.78 (L) 03/14/2018 1359   HGB 8.5 (L) 03/14/2018 1359   HGB 9.4 (L) 10/11/2017 1439   HGB 11.4 (L) 09/27/2016 1251   HCT 25.2 (L) 03/14/2018 1359   HCT 28.5 (L) 10/11/2017 1439   HCT 32.7 (L) 09/27/2016 1251   PLT 88 (L) 03/14/2018 1359   PLT 198 05/30/2017 0843   PLT 223 09/27/2016 1251   PLT 118 (L) 05/04/2016 1435   MCV 90.6 03/14/2018 1359   MCV 84.9 09/27/2016 1251   MCH 30.6 03/14/2018 1359   MCHC 33.7 03/14/2018 1359   RDW 16.5 (H) 03/14/2018 1359   RDW 16.7 (H) 09/27/2016 1251   LYMPHSABS 4.6 (H) 03/14/2018 1359   LYMPHSABS 1.1 09/27/2016 1251   MONOABS 0.3 03/14/2018 1359   MONOABS 0.5 09/27/2016 1251   EOSABS 0.0 03/14/2018 1359   EOSABS 0.0 09/27/2016 1251   EOSABS 0.1 05/04/2016 1435   BASOSABS 0.0 03/14/2018 1359   BASOSABS 0.0 09/27/2016 1251    . CMP Latest Ref Rng & Units 03/14/2018 03/05/2018 03/04/2018  Glucose 70 - 99 mg/dL 97 136(H) 87  BUN 8 - 23 mg/dL 21 17 16   Creatinine 0.44 - 1.00 mg/dL 0.91 0.89 0.77  Sodium 135 - 145 mmol/L 132(L) 132(L) 135  Potassium 3.5 - 5.1 mmol/L 4.5 4.1 3.8  Chloride 98 - 111 mmol/L 96(L) 96(L) 99  CO2 22 - 32 mmol/L 28 25 28   Calcium 8.9 - 10.3 mg/dL 8.3(L) 7.7(L) 7.9(L)  Total Protein 6.5 - 8.1 g/dL 7.1 - -  Total Bilirubin 0.3 - 1.2 mg/dL 1.0 - -  Alkaline Phos 38 - 126 U/L 86 - -  AST 15 - 41 U/L 37 - -  ALT 0 - 44 U/L 8 - -    Lab Results  Component Value Date   FERRITIN 228 03/01/2018   .         12/22/17 Peripheral Blood Flow Cytometry:    RADIOGRAPHIC STUDIES: I have personally reviewed the radiological images as  listed and agreed with the findings in the report. Dg Chest 2 View  Result Date: 03/01/2018 CLINICAL DATA:  Generalized weakness EXAM: CHEST - 2 VIEW COMPARISON:  05/19/2017 FINDINGS: Cardiac shadow remains enlarged. Tortuous thoracic aorta is noted with mild calcifications. The lungs are well aerated bilaterally. No focal infiltrate or sizable effusion is seen. Chronic compression deformity of the lower thoracic spine is noted. IMPRESSION: Stable cardiomegaly without acute abnormality. Electronically Signed   By: Inez Catalina M.D.   On: 03/01/2018 23:28   Dg Chest Port 1 View  Result Date: 03/04/2018 CLINICAL DATA:  One-week history of shortness of breath that acutely worsened 3 days ago. Former smoker. Current history of CHF. EXAM: PORTABLE CHEST 1 VIEW COMPARISON:  03/01/2018 and earlier. FINDINGS: Cardiac silhouette markedly enlarged, unchanged. Thoracic aorta tortuous and atherosclerotic, unchanged. Hilar and mediastinal contours otherwise unremarkable. Lungs clear. Pulmonary vascularity normal. No pleural effusions. IMPRESSION: Stable marked cardiomegaly.  No acute cardiopulmonary disease. Electronically Signed   By: Evangeline Dakin M.D.   On: 03/04/2018 09:23   Vas [place] Lower Extremity Venous (dvt)  Result Date: 03/04/2018  Lower Venous Study Indications: Swelling.  Performing Technologist: June Leap RDMS, RVT  Examination Guidelines: A complete evaluation includes B-mode imaging, spectral Doppler, color Doppler, and power Doppler as needed of all accessible portions of each vessel. Bilateral testing is considered an integral part of a complete examination. Limited examinations for reoccurring indications may be performed as noted.  Right Venous Findings: +---------+---------------+---------+-----------+----------+-------+          CompressibilityPhasicitySpontaneityPropertiesSummary +---------+---------------+---------+-----------+----------+-------+ CFV      Full           Yes       Yes                          +---------+---------------+---------+-----------+----------+-------+ SFJ      Full                                                 +---------+---------------+---------+-----------+----------+-------+ FV Prox  Full                                                 +---------+---------------+---------+-----------+----------+-------+ FV Mid   Full                                                 +---------+---------------+---------+-----------+----------+-------+ FV DistalFull                                                 +---------+---------------+---------+-----------+----------+-------+ PFV      Full                                                 +---------+---------------+---------+-----------+----------+-------+  POP      Full           Yes      Yes                          +---------+---------------+---------+-----------+----------+-------+ PTV      Full                                                 +---------+---------------+---------+-----------+----------+-------+ PERO     Full                                                 +---------+---------------+---------+-----------+----------+-------+  Left Venous Findings: +---------+---------------+---------+-----------+----------+-------+          CompressibilityPhasicitySpontaneityPropertiesSummary +---------+---------------+---------+-----------+----------+-------+ CFV      Full           Yes      Yes                          +---------+---------------+---------+-----------+----------+-------+ SFJ      Full                                                 +---------+---------------+---------+-----------+----------+-------+ FV Prox  Full                                                 +---------+---------------+---------+-----------+----------+-------+ FV Mid   Full                                                  +---------+---------------+---------+-----------+----------+-------+ FV DistalFull                                                 +---------+---------------+---------+-----------+----------+-------+ PFV      Full                                                 +---------+---------------+---------+-----------+----------+-------+ POP      Full           Yes      Yes                          +---------+---------------+---------+-----------+----------+-------+ PTV      Full                                                 +---------+---------------+---------+-----------+----------+-------+  PERO     Full                                                 +---------+---------------+---------+-----------+----------+-------+    Summary: Right: There is no evidence of deep vein thrombosis in the lower extremity. No cystic structure found in the popliteal fossa. Left: There is no evidence of deep vein thrombosis in the lower extremity. No cystic structure found in the popliteal fossa.  *See table(s) above for measurements and observations. Electronically signed by Ruta Hinds MD on 03/04/2018 at 2:24:53 PM.    Final     ASSESSMENT & PLAN:   85 y.o. with   1) Splenic marginal zone lymphoma On presentation patient had anemia, lymphocytosis, thrombocytopenia and significant constitutional symptoms including fatigue, some low-grade fevers and night sweats. Also had abdominal discomfort related to her splenomegaly and anorexia with spleen of about 23cms on PET/CT. Serum kappa lambda free light chain ratio abnormally elevated. Myeloma panel no M spike IFE shows polyclonal gammopathy  Patient is status post Rituxan weekly 4 doses (06/27/16-07/22/16)  05/30/17 flow cytometry shows persistent SMZL clonal lymphocytes.   12/19/17 US Abdomen revealed Severe splenomegaly is again noted consistent with history of lymphoma. 6 cm rounded echogenic abnormality is noted medially within the spleen  consistent with necrosis as described on prior CT and PET scans. 19 mm echogenic focus seen in caudate lobe of liver. This may represent hemangioma, but malignancy or metastatic disease cannot be excluded. MRI may be performed for further evaluation. Gallbladder is not visualized. Left kidney is not visualized due to overlying spleen   12/22/17 Peripheral Blood Flow Cytometry which revealed a monoclonal B Cell population consistent with patient's known Splenic marginal zone lymphoma  Rituxan weekly x4 (12/22/17-01/12/18).   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  Thrombocytopenia improved PLT and lymphocytosis has remained resolved   Decreased splenomegaly significantly as per rpt [place] abd on 09/20/16. Stable as seen on 05/19/17 CT AP.   3) hypothyroidism- on levothyroxine  4) nonischemic systolic cardiomyopathy -  Cardiac catheterization December 2017- nonobstructive coronary artery disease. - EF decreased to 25-30% on 11/01/16 ECHO  5) Rigors and fever related to Rituxan vs Tumor lysis after 1st dose. Has not had any of these issues with adjusted pre-medications with the 2nd dose of Rituxan and is overall feeling much better.  6) Weakness and fatigue - likely multifactorial - CHF, iron deficiency, age, hypothyroidism. Less likely related primarily to [place] .  PLAN:  -Discussed pt labwork today, 03/14/18; WBC normalized to  8.6k, HGB stable at 8.5, PLT stable at 88k, LDH at 533. -Expect that the patient's spleen will continue to slowly decrease in size over the next few months -Pt will let me know in the interim if she feels more fatigued as an indication for a blood transfusion -Continue eating well, staying hydrated -Continue 150mg  Iron Polysaccharide po daily. No need for blood transfusion today. -Will consider maintenance Rituxan q2-19months according to response -Plan for next CT 3-4 months from December 2019, to monitor splenomegaly.    -Will see the pt back in 2 months  6) Bilateral ankle swelling with left ankle induration  -Previous Doppler negative for DVT PLAN  -Continues to use compression socks, epsom salt bath and continue to RICE -Stable    RTC with Dr [name] with  labs in 2 months   All of the patients and her accompanying family's questions were answered with apparent satisfaction. The patient knows to call the clinic with any problems, questions or concerns.  The total time spent in the appt was 20 minutes and more than 50% was on counseling and direct patient cares.      Sullivan Lone MD [place] AAHIVMS [facility] Hematology/Oncology Physician [facility]  (Office):       [phone] (Work cell):  [phone] (Fax):           [phone]  I, Baldwin [place], am acting as a scribe for Dr. Sullivan Lone.   .I have reviewed the above documentation for accuracy and completeness, and I agree with the above. Brunetta Genera MD

## 2018-03-14 ENCOUNTER — Telehealth: Payer: Self-pay | Admitting: Hematology

## 2018-03-14 ENCOUNTER — Inpatient Hospital Stay: Payer: Medicare Other | Attending: Hematology

## 2018-03-14 ENCOUNTER — Inpatient Hospital Stay (HOSPITAL_BASED_OUTPATIENT_CLINIC_OR_DEPARTMENT_OTHER): Payer: Medicare Other | Admitting: Hematology

## 2018-03-14 VITALS — BP 106/51 | HR 67 | Temp 98.3°F | Resp 18 | Ht 63.0 in | Wt 113.0 lb

## 2018-03-14 DIAGNOSIS — Z79899 Other long term (current) drug therapy: Secondary | ICD-10-CM | POA: Diagnosis not present

## 2018-03-14 DIAGNOSIS — Z9221 Personal history of antineoplastic chemotherapy: Secondary | ICD-10-CM

## 2018-03-14 DIAGNOSIS — C8307 Small cell B-cell lymphoma, spleen: Secondary | ICD-10-CM

## 2018-03-14 DIAGNOSIS — E039 Hypothyroidism, unspecified: Secondary | ICD-10-CM | POA: Insufficient documentation

## 2018-03-14 LAB — CBC WITH DIFFERENTIAL/PLATELET
ABS IMMATURE GRANULOCYTES: 0 10*3/uL (ref 0.00–0.07)
Band Neutrophils: 5 %
Basophils Absolute: 0 10*3/uL (ref 0.0–0.1)
Basophils Relative: 0 %
Eosinophils Absolute: 0 10*3/uL (ref 0.0–0.5)
Eosinophils Relative: 0 %
HCT: 25.2 % — ABNORMAL LOW (ref 36.0–46.0)
Hemoglobin: 8.5 g/dL — ABNORMAL LOW (ref 12.0–15.0)
Lymphocytes Relative: 54 %
Lymphs Abs: 4.6 10*3/uL — ABNORMAL HIGH (ref 0.7–4.0)
MCH: 30.6 pg (ref 26.0–34.0)
MCHC: 33.7 g/dL (ref 30.0–36.0)
MCV: 90.6 fL (ref 80.0–100.0)
Monocytes Absolute: 0.3 10*3/uL (ref 0.1–1.0)
Monocytes Relative: 4 %
NEUTROS ABS: 3.6 10*3/uL (ref 1.7–17.7)
Neutrophils Relative %: 37 %
Platelets: 88 10*3/uL — ABNORMAL LOW (ref 150–400)
RBC: 2.78 MIL/uL — ABNORMAL LOW (ref 3.87–5.11)
RDW: 16.5 % — ABNORMAL HIGH (ref 11.5–15.5)
WBC: 8.6 10*3/uL (ref 4.0–10.5)
nRBC: 0 % (ref 0.0–0.2)

## 2018-03-14 LAB — CMP (CANCER CENTER ONLY)
ALT: 8 U/L (ref 0–44)
AST: 37 U/L (ref 15–41)
Albumin: 3 g/dL — ABNORMAL LOW (ref 3.5–5.0)
Alkaline Phosphatase: 86 U/L (ref 38–126)
Anion gap: 8 (ref 5–15)
BUN: 21 mg/dL (ref 8–23)
CO2: 28 mmol/L (ref 22–32)
Calcium: 8.3 mg/dL — ABNORMAL LOW (ref 8.9–10.3)
Chloride: 96 mmol/L — ABNORMAL LOW (ref 98–111)
Creatinine: 0.91 mg/dL (ref 0.44–1.00)
GFR, Estimated: 56 mL/min — ABNORMAL LOW (ref >60)
Glucose, Bld: 97 mg/dL (ref 70–99)
Potassium: 4.5 mmol/L (ref 3.5–5.1)
Sodium: 132 mmol/L — ABNORMAL LOW (ref 135–145)
Total Bilirubin: 1 mg/dL (ref 0.3–1.2)
Total Protein: 7.1 g/dL (ref 6.5–8.1)

## 2018-03-14 LAB — RETICULOCYTES
Immature Retic Fract: 18.8 % — ABNORMAL HIGH (ref 2.3–15.9)
RBC.: 2.78 MIL/uL — ABNORMAL LOW (ref 3.87–5.11)
RETIC CT PCT: 5.3 % — AB (ref 0.4–3.1)
Retic Count, Absolute: 147.6 10*3/uL (ref 19.0–186.0)

## 2018-03-14 LAB — LACTATE DEHYDROGENASE: LDH: 533 U/L — ABNORMAL HIGH (ref 98–192)

## 2018-03-14 NOTE — Telephone Encounter (Signed)
Scheduled appt per 02/26 los. ° °Printed calendar and avs. °

## 2018-03-15 LAB — TSH: TSH: 50.84 — AB (ref 0.41–5.90)

## 2018-03-23 ENCOUNTER — Other Ambulatory Visit: Payer: Self-pay | Admitting: Internal Medicine

## 2018-03-23 ENCOUNTER — Encounter: Payer: Self-pay | Admitting: Internal Medicine

## 2018-03-23 ENCOUNTER — Non-Acute Institutional Stay (SKILLED_NURSING_FACILITY): Payer: Medicare Other | Admitting: Internal Medicine

## 2018-03-23 DIAGNOSIS — E039 Hypothyroidism, unspecified: Secondary | ICD-10-CM | POA: Diagnosis not present

## 2018-03-23 DIAGNOSIS — I10 Essential (primary) hypertension: Secondary | ICD-10-CM

## 2018-03-23 DIAGNOSIS — I428 Other cardiomyopathies: Secondary | ICD-10-CM

## 2018-03-23 DIAGNOSIS — R531 Weakness: Secondary | ICD-10-CM | POA: Diagnosis not present

## 2018-03-23 DIAGNOSIS — C8307 Small cell B-cell lymphoma, spleen: Secondary | ICD-10-CM

## 2018-03-23 DIAGNOSIS — D509 Iron deficiency anemia, unspecified: Secondary | ICD-10-CM | POA: Diagnosis not present

## 2018-03-23 MED ORDER — LEVOTHYROXINE SODIUM 150 MCG PO TABS: 150.0000 ug | ORAL_TABLET | Freq: Every day | ORAL | 0 refills | Status: DC

## 2018-03-23 MED ORDER — GERITOL TONIC PO LIQD: 10.0000 mL | Freq: Every day | ORAL | 0 refills | Status: DC

## 2018-03-23 MED ORDER — CARVEDILOL 6.25 MG PO TABS: 6.2500 mg | ORAL_TABLET | Freq: Two times a day (BID) | ORAL | 0 refills | Status: DC

## 2018-03-23 MED ORDER — POLYSACCHARIDE IRON COMPLEX 150 MG PO CAPS: 150.0000 mg | ORAL_CAPSULE | Freq: Every day | ORAL | 0 refills | Status: DC

## 2018-03-23 MED ORDER — TORSEMIDE 10 MG PO TABS: 10.0000 mg | ORAL_TABLET | Freq: Every day | ORAL | 0 refills | Status: DC

## 2018-03-23 MED ORDER — POTASSIUM CHLORIDE CRYS ER 20 MEQ PO TBCR: 20.0000 meq | EXTENDED_RELEASE_TABLET | Freq: Every day | ORAL | 0 refills | Status: DC

## 2018-03-23 MED ORDER — LOSARTAN POTASSIUM 25 MG PO TABS: 25.0000 mg | ORAL_TABLET | Freq: Every day | ORAL | 0 refills | Status: DC

## 2018-03-23 NOTE — Progress Notes (Signed)
Location:    [facility] Room Number: 108/A Place of Service:  SNF [phone])  Provider: Granville Lewis PA-C  PCP: [name], MD Patient Care Team: [name], MD as PCP - General (Family Medicine) [name], MD as PCP - Cardiology (Cardiology)  Extended Emergency Contact Information Primary Emergency Contact: [name],[name] Address: [address]          [place], [place] [address] [name] Phone: [phone] Relation: Daughter Secondary Emergency Contact: brooks, penny Mobile Phone: [phone] Relation: Daughter  Code Status: DNR Goals of care:  Advanced Directive information Advanced Directives 03/23/2018  Does Patient Have a Medical Advance Directive? Yes  Type of Advance Directive Out of facility DNR (pink MOST or yellow form)  Does patient want to make changes to medical advance directive? No - Patient declined  Copy of [place] in Chart? -  Would patient like information on creating a medical advance directive? No - Patient declined     No Known Allergies  Chief Complaint  Patient presents with  . Discharge Note    Discharge Visit    HPI:  85 y.o. female seen today for discharge from facility tomorrow. Patient is here for rehab after hospitalization last month for increased weakness and fatigue with a history of lymphoma and chronic systolic CHF.  She also was found to have a UTI E. coli and had a elevated TSH of 38.  She received IV Synthroid and antibiotics.  Her symptoms improved.  Apparently there was some noncompliance with her Synthroid medication at home.  She did receive a unit of packed red blood cells in the hospital secondary to anemia which appears to be chronic-- hemoglobin on lab done on February 26 showed relative stability at 8.5.  In regards to her lymphoma she is followed by Dr. 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 She did see Dr. [name] on February 26 and lab work was thought to be stable with recommendation to continue iron supplementation and possible CT scan to follow-up on her splenomegaly in 3 to 4 months with recommendation to see patient back in office in 2 months.    Other diagnoses include hypertension she is on Coreg and losartan.  In regards to CHF ejection fraction of 20-25% she is on low-dose Demadex with potassium.  She tells me she will be going home with family.  Her last TSH was 50.84 done on February 27 Synthroid dose has been increased up to 150 mcg a day this will need repeating in about 3 weeks.  Currently she has no complaints she is resting in bed comfortably she is using a walker nursing does not report any issues it appears she would still benefit from PT and OT as well as home health support.--She still continues to have significant weakness            Past Medical History:  Diagnosis Date  . Anemia   . Anginal pain ([place])   . Arthritis   . Cancer ([place])   . CHF (congestive heart failure) ([place])   . Dyspnea   . Dysrhythmia   . Hypertension   . Hypothyroidism   . Thyroid disease     Past Surgical History:  Procedure Laterality Date  . CARDIAC CATHETERIZATION N/A 01/07/2016   Procedure: Left Heart Cath and Coronary Angiography;  Surgeon: Charlann Lange  Irish Lack, MD;  Location: [facility];  Service: Cardiovascular;  Laterality: N/A;  . CATARACT EXTRACTION, BILATERAL    . FRACTURE SURGERY    . HIP SURGERY    . JOINT REPLACEMENT    . TONSILLECTOMY        reports that she has quit smoking. She has never used smokeless tobacco. She reports that she does not drink alcohol or use drugs. Social History   Socioeconomic History  . Marital status: Single    Spouse name: Not on file  . Number of children: Not on file  . Years of education: Not on file  . Highest education level: Not on file  Occupational History    . Not on file  Social Needs  . Financial resource strain: Not on file  . Food insecurity:    Worry: Not on file    Inability: Not on file  . Transportation needs:    Medical: Not on file    Non-medical: Not on file  Tobacco Use  . Smoking status: Former Research scientist (life sciences)  . Smokeless tobacco: Never Used  Substance and Sexual Activity  . Alcohol use: No  . Drug use: No  . Sexual activity: Not on file  Lifestyle  . Physical activity:    Days per week: Not on file    Minutes per session: Not on file  . Stress: Not on file  Relationships  . Social connections:    Talks on phone: Not on file    Gets together: Not on file    Attends religious service: Not on file    Active member of club or organization: Not on file    Attends meetings of clubs or organizations: Not on file    Relationship status: Not on file  . Intimate partner violence:    Fear of current or ex partner: Not on file    Emotionally abused: Not on file    Physically abused: Not on file    Forced sexual activity: Not on file  Other Topics Concern  . Not on file  Social History Narrative  . Not on file   Functional Status Survey:    No Known Allergies  Pertinent  Health Maintenance Due  Topic Date Due  . DEXA SCAN  04/23/2018 (Originally [birth year]09/1992)  . PNA vac Low Risk Adult (1 of 2 - PCV13) 04/23/2018 (Originally 05/09/1990)  . INFLUENZA VACCINE  Completed    Medications: Allergies as of 03/23/2018   No Known Allergies     Medication List       Accurate as of March 23, 2018 12:52 PM. Always use your most recent med list.        acetaminophen 500 MG tablet Commonly known as:  TYLENOL Take 500 mg by mouth every 4 (four) hours as needed for mild pain or moderate pain.   bisacodyl 10 MG suppository Commonly known as:  DULCOLAX Constipation (2 of 4): If not relieved by MOM, give 10 mg Bisacodyl suppositiory rectally X 1 dose in 24 hours as needed (Do not use constipation standing orders for residents with  renal failure/CFR less than 30. Contact MD for orders)   carvedilol 6.25 MG tablet Commonly known as:  COREG Take 6.25 mg by mouth 2 (two) times daily with a meal.   Geritol Liqd Take 10 mLs by mouth daily.   iron polysaccharides 150 MG capsule Commonly known as:  NIFEREX Take 1 capsule (150 mg total) by mouth daily.   levothyroxine 150 MCG tablet Commonly  known as:  SYNTHROID, LEVOTHROID Take 150 mcg by mouth daily before breakfast. For Hypothyroidism   losartan 25 MG tablet Commonly known as:  COZAAR Take 1 tablet (25 mg total) by mouth daily.   magnesium hydroxide 400 MG/5ML suspension Commonly known as:  MILK OF MAGNESIA Constipation (1 of 4): If no BM in 3 days, give 30 cc Milk of Magnesium p.o. x 1 dose in 24 hours as needed (Do not use standing constipation orders for residents with renal failure CFR less than 30. Contact MD for orders)   NON FORMULARY 18ml Med Pass to promote weight gain   potassium chloride SA 20 MEQ tablet Commonly known as:  K-DUR,KLOR-CON Take 1 tablet (20 mEq total) by mouth daily.   RA SALINE ENEMA RE Constipation (3 of 4): If not relieved by Biscodyl suppository, give disposable Saline Enema rectally X 1 dose/24 hrs as needed (Do not use constipation standing orders for residents with renal failure/CFR less than 30. Contact MD for orders)   Constipation (4 of 4): Contact MD as needed if no results from enema (Nursing Measure)   torsemide 10 MG tablet Commonly known as:  DEMADEX Take 10 mg by mouth daily.       Review of Systems   General she not complaining of any fever or chills.--Still has significant weakness  Skin is not complaining of rashes or itching.  Head ears eyes nose mouth and throat is not complaining of any sore throat or visual changes.  Respiratory does not complain of shortness of breath or cough.  Cardiac is not complaining of chest pain does not appear to have significant edema.  GI is not complaining of nausea  vomiting diarrhea constipation--has baseline chronic abdominal tenderness she states because of her enlarged spleen Continues to be somewhat hesitant to let people examine it but this is not new.  GU does not complain of dysuria.  Musculoskeletal is not complaining of joint pain continues to have weakness however.  Neurologic positive for weakness does not complain of dizziness headache or numbness  Psych does not complain of being depressed or overtly anxious she says she is looking forward to going home    Vitals:   03/23/18 1240  BP: (!) 129/59  Pulse: 73  Resp: 18  Temp: (!) 97.3 F (36.3 C)  TempSrc: Oral  Weight: 108 lb 3.2 oz (49.1 kg)  Height: 5\' 4"  (1.626 m)   Body mass index is 18.57 kg/m. Physical Exam   In general this is a pleasant elderly female in no distress.  Skin is warm and dry.  Eyes visual acuity appears to be intact appear to be clear she did close her eyes fairly tightly with attempts at exam.  Oropharynx is clear mucous membranes appear slightly dry  Chest is clear to auscultation there is no labored breathing.  Heart is regular rate and rhythm--with an occasional irregular beat-- she does have a history of a slight systolic murmur she does not have significant lower extremity edema.  Abdomen Limited exam since she was hesitant to let me examining it-but this is not new.  It is somewhat protuberant it is soft has what appears to be baseline tenderness to palpation upper and lower abdomen.  Bowel sounds are positive.  Musculoskeletal is able to move all extremities x4 she does appear to have some interosseous wasting is not new.  She is using her walker to go to the bathroom.  Neurologic as noted above could not really appreciate lateralizing findings her  speech is clear.  Psych she is grossly alert and oriented pleasant and appropriate  Labs reviewed: Basic Metabolic Panel: Recent Labs    03/02/18 1553  03/04/18 0712 03/05/18 0559  03/14/18 1359  NA  --    < > 135 132* 132*  K  --    < > 3.8 4.1 4.5  CL  --    < > 99 96* 96*  CO2  --    < > 28 25 28   GLUCOSE  --    < > 87 136* 97  BUN  --    < > 16 17 21   CREATININE  --    < > 0.77 0.89 0.91  CALCIUM  --    < > 7.9* 7.7* 8.3*  MG 2.0  --   --   --   --    < > = values in this interval not displayed.   Liver Function Tests: Recent Labs    01/05/18 1046 01/11/18 1011 03/14/18 1359  AST 26 22 37  ALT 6 7 8   ALKPHOS 72 76 86  BILITOT 0.8 0.5 1.0  PROT 7.8 7.7 7.1  ALBUMIN 2.8* 2.8* 3.0*   Recent Labs    05/19/17 0956  LIPASE 25   No results for input(s): AMMONIA in the last 8760 hours. CBC: Recent Labs    01/05/18 1046 01/11/18 1011  03/04/18 0712 03/05/18 0559 03/14/18 1359  WBC 20.0* 13.2*   < > 9.7 9.0 8.6  NEUTROABS 5.0 3.8  --   --   --  3.6  HGB 10.6* 10.6*   < > 8.5* 8.7* 8.5*  HCT 32.2* 31.9*   < > 26.0* 26.6* 25.2*  MCV 92.5 90.4   < > 91.9 93.3 90.6  PLT 74* 73*   < > 73* 76* 88*   < > = values in this interval not displayed.   Cardiac Enzymes: No results for input(s): CKTOTAL, CKMB, CKMBINDEX, TROPONINI in the last 8760 hours. BNP: Invalid input(s): POCBNP CBG: No results for input(s): GLUCAP in the last 8760 hours.  Procedures and Imaging Studies During Stay: Dg Chest 2 View  Result Date: 03/01/2018 CLINICAL DATA:  Generalized weakness EXAM: CHEST - 2 VIEW COMPARISON:  05/19/2017 FINDINGS: Cardiac shadow remains enlarged. Tortuous thoracic aorta is noted with mild calcifications. The lungs are well aerated bilaterally. No focal infiltrate or sizable effusion is seen. Chronic compression deformity of the lower thoracic spine is noted. IMPRESSION: Stable cardiomegaly without acute abnormality. Electronically Signed   By: Inez Catalina M.D.   On: 03/01/2018 23:28   Dg Chest Port 1 View  Result Date: 03/04/2018 CLINICAL DATA:  One-week history of shortness of breath that acutely worsened 3 days ago. Former smoker. Current history  of CHF. EXAM: PORTABLE CHEST 1 VIEW COMPARISON:  03/01/2018 and earlier. FINDINGS: Cardiac silhouette markedly enlarged, unchanged. Thoracic aorta tortuous and atherosclerotic, unchanged. Hilar and mediastinal contours otherwise unremarkable. Lungs clear. Pulmonary vascularity normal. No pleural effusions. IMPRESSION: Stable marked cardiomegaly.  No acute cardiopulmonary disease. Electronically Signed   By: Evangeline Dakin M.D.   On: 03/04/2018 09:23   Vas [place] Lower Extremity Venous (dvt)  Result Date: 03/04/2018  Lower Venous Study Indications: Swelling.  Performing Technologist: June Leap RDMS, RVT  Examination Guidelines: A complete evaluation includes B-mode imaging, spectral Doppler, color Doppler, and power Doppler as needed of all accessible portions of each vessel. Bilateral testing is considered an integral part of a complete examination. Limited examinations for reoccurring indications may  be performed as noted.  Right Venous Findings: +---------+---------------+---------+-----------+----------+-------+          CompressibilityPhasicitySpontaneityPropertiesSummary +---------+---------------+---------+-----------+----------+-------+ CFV      Full           Yes      Yes                          +---------+---------------+---------+-----------+----------+-------+ SFJ      Full                                                 +---------+---------------+---------+-----------+----------+-------+ FV Prox  Full                                                 +---------+---------------+---------+-----------+----------+-------+ FV Mid   Full                                                 +---------+---------------+---------+-----------+----------+-------+ FV DistalFull                                                 +---------+---------------+---------+-----------+----------+-------+ PFV      Full                                                  +---------+---------------+---------+-----------+----------+-------+ POP      Full           Yes      Yes                          +---------+---------------+---------+-----------+----------+-------+ PTV      Full                                                 +---------+---------------+---------+-----------+----------+-------+ PERO     Full                                                 +---------+---------------+---------+-----------+----------+-------+  Left Venous Findings: +---------+---------------+---------+-----------+----------+-------+          CompressibilityPhasicitySpontaneityPropertiesSummary +---------+---------------+---------+-----------+----------+-------+ CFV      Full           Yes      Yes                          +---------+---------------+---------+-----------+----------+-------+ SFJ      Full                                                 +---------+---------------+---------+-----------+----------+-------+  FV Prox  Full                                                 +---------+---------------+---------+-----------+----------+-------+ FV Mid   Full                                                 +---------+---------------+---------+-----------+----------+-------+ FV DistalFull                                                 +---------+---------------+---------+-----------+----------+-------+ PFV      Full                                                 +---------+---------------+---------+-----------+----------+-------+ POP      Full           Yes      Yes                          +---------+---------------+---------+-----------+----------+-------+ PTV      Full                                                 +---------+---------------+---------+-----------+----------+-------+ PERO     Full                                                  +---------+---------------+---------+-----------+----------+-------+    Summary: Right: There is no evidence of deep vein thrombosis in the lower extremity. No cystic structure found in the popliteal fossa. Left: There is no evidence of deep vein thrombosis in the lower extremity. No cystic structure found in the popliteal fossa.  *See table(s) above for measurements and observations. Electronically signed by Ruta Hinds MD on 03/04/2018 at 2:24:53 PM.    Final     Assessment/Plan:     #1 history of hypothyroidism against TSH was significantly elevated and hospital adjustments have been made she will need follow-up TSH in approximately 3 weeks-this can be done by primary care provider.  2.  History of lymphoma-she is followed by Dr. Martin Majestic LE at [facility] oncology- continues to complain at times of tenderness to her abdomen but this is chronic it appears.--She did see oncology on February 26 and labs are thought to be stable recommendation to continue her iron supplementation and possible CT scan in 3 to 4 months-with follow-up appointment in 2 months  3.  History of chronic systolic CHF-she is on low-dose Demadex with potassium I do not see evidence of edema today this appears to be clinically stable but will need expedient follow-up by primary care provider.  4.  History of anemia with an element of chronicity hemoglobin--has been running  in the low eights on recent labs-- appears to be relatively stable will have home health recheck this early next week and notify primary care provider of results to ensure stability  She is on Niferex as well as Geritol.    5.  Hypertension she continues on Coreg 6.25 mg twice daily as well as Cozaar 25 mg a day at this point appears relatively stable will warrant follow-up by primary care provider.   She will be going home with family she will need home health support as well as PT and OT as well as a 3 in 1 bedside commode as well as a wheelchair to assist with  ambulation with continued weakness.     Addendum I have spoken with patient's daughter Ms. Reed- she states that the family is appealing her discharge because of her continued weakness- she also is concerned her mom may be getting a bit dry from the diuretic- I will for now discontinue her Demadex as well as potassium supplementation and update labs tomorrow.  Clinically she does appear weak- daughter states that her edema is nonexistent which is good for her but is afraid that the diuretic may be making her somewhat dry-again will update labs and DC the diuretic for now.  Her daughter also states that if the appeal was declined she still will keep her mother in the facility through at least early next week.  [id number] of note greater than 40 minutes spent assessing patient-discussing her status with staff as well as with her daughter via phone- and coordinating and formulating a plan of care- of note greater than 50% of time spent coordinating a plan of care with input as noted above

## 2018-03-24 ENCOUNTER — Encounter: Payer: Self-pay | Admitting: Internal Medicine

## 2018-03-29 ENCOUNTER — Non-Acute Institutional Stay (SKILLED_NURSING_FACILITY): Payer: Medicare Other | Admitting: Internal Medicine

## 2018-03-29 ENCOUNTER — Encounter: Payer: Self-pay | Admitting: Internal Medicine

## 2018-03-29 DIAGNOSIS — R531 Weakness: Secondary | ICD-10-CM

## 2018-03-29 DIAGNOSIS — D509 Iron deficiency anemia, unspecified: Secondary | ICD-10-CM | POA: Diagnosis not present

## 2018-03-29 DIAGNOSIS — I428 Other cardiomyopathies: Secondary | ICD-10-CM | POA: Diagnosis not present

## 2018-03-29 DIAGNOSIS — E039 Hypothyroidism, unspecified: Secondary | ICD-10-CM | POA: Diagnosis not present

## 2018-03-29 DIAGNOSIS — C8307 Small cell B-cell lymphoma, spleen: Secondary | ICD-10-CM

## 2018-03-29 NOTE — Progress Notes (Signed)
Location:    [facility] Room Number: 108/A Place of Service:  SNF [phone])  Provider: Granville Lewis PA-C  PCP: [name], MD Patient Care Team: [name], MD as PCP - General (Family Medicine) [name], MD as PCP - Cardiology (Cardiology)  Extended Emergency Contact Information Primary Emergency Contact: [name],[name] Address: [address]          [place], [place] [address] Johnnette Litter of [place] Phone: [phone] Relation: Daughter Secondary Emergency Contact: brooks, penny Mobile Phone: [phone] Relation: Daughter  Code Status: DNR Goals of care:  Advanced Directive information Advanced Directives 03/29/2018  Does Patient Have a Medical Advance Directive? Yes  Type of Advance Directive Out of facility DNR (pink MOST or yellow form)  Does patient want to make changes to medical advance directive? No - Patient declined  Copy of [place] in Chart? -  Would patient like information on creating a medical advance directive? No - Patient declined     No Known Allergies  Chief Complaint  Patient presents with   Discharge Note    Discharge Visit    HPI:  85 y.o. female seen today for discharge from facility.  She actually was scheduled for discharge last weekend she continued to have weakness and family did appeal and she has stayed an extra few days.  This appears to have benefited her she appears to be a bit stronger here she will be home with family who is very supportive.  She has been here for rehab after hospitalization for increased weakness and fatigue with a history of lymphoma and chronic systolic CHF.  She also was treated for UTI E. coli-she also had an elevated TSH which remains elevated but adjustments have been made in her Synthroid this will need follow-up in about 3 weeks by primary care provider.  She did receive IV Synthroid in the hospital and antibiotics. Apparently  there were noncompliance issues with her Synthroid medication at home.  She also received a unit of packed red blood cells in the hospital because of anemia which appears to have an element of chronicity- hemoglobin on lab done on February 26 showed a hemoglobin of 8.5-we ordered 1 over the weekend however and it had gone down somewhat at 7.9 subsequently we ordered 1 for the day and it actually has risen up to 8.9 today she continues with some chronic thrombocytopenia the platelet count of 79,000 today which is relatively baseline with recent values.  Regards to her lymphoma she is followed by Dr. Chip Boer: Oncology she has a history of splenic marginal zone B-cell lymphoma as well as chronic anemia as noted above she was thought to be stable when she had lab work done on February 26 recommendation was to continue with iron supplementation and possible CT scan to follow-up on her splenomegaly in about 3 months- she will see Dr. [name] again in about 2 months it appears  Regards to hypertension she continues on Coreg and losartan-appears that time she has somewhat lower blood pressures with systolic under 086 on orthostatics done today systolic actually was in the 80s on standing appears baseline generally is a systolic in the lower 578I  In regards to weakness however she says she is feeling stronger and family concurs with this- she is on Coreg 6.25 mg twice daily as well as losartan 25 mg a day.  At regards to CHF ejection fraction was 20 to 25% she  was on low-dose Demadex with potassium supplementation however family thought that she was probably a bit over diuresed and was causing weakness and this is been held now for several days and she does appear to be a bit stronger her weight appears relatively stable at around 109 pounds she does not really have significant edema.  This will need follow-up by primary care provider but will hold her diuretic in the interim.  Her TSH remains elevated it was  50.87 on February 27 lab and her Synthroid dose has been increased up to 150 mcg a day and again this will need repeating in about 3 weeks.  Currently she says she is feeling a bit stronger she is sitting in the chair she is bright and alert looking forward to going home.  She will need home health support when she goes home as well as PT OT and nursing support to help with her medical management she also will need a 3 in 1 bedside commode and a wheelchair to help with ambulation since she continues to be a fall risk       Past Medical History:  Diagnosis Date   Anemia    Anginal pain ([place])    Arthritis    Cancer ([place])    CHF (congestive heart failure) ([place])    Dyspnea    Dysrhythmia    Hypertension    Hypothyroidism    Thyroid disease     Past Surgical History:  Procedure Laterality Date   CARDIAC CATHETERIZATION N/A 01/07/2016   Procedure: Left Heart Cath and Coronary Angiography;  Surgeon: Jettie Booze, MD;  Location: [facility];  Service: Cardiovascular;  Laterality: N/A;   CATARACT EXTRACTION, BILATERAL     FRACTURE SURGERY     HIP SURGERY     JOINT REPLACEMENT     TONSILLECTOMY        reports that she has quit smoking. She has never used smokeless tobacco. She reports that she does not drink alcohol or use drugs. Social History   Socioeconomic History   Marital status: Single    Spouse name: Not on file   Number of children: Not on file   Years of education: Not on file   Highest education level: Not on file  Occupational History   Not on file  Social Needs   Financial resource strain: Not on file   Food insecurity:    Worry: Not on file    Inability: Not on file   Transportation needs:    Medical: Not on file    Non-medical: Not on file  Tobacco Use   Smoking status: Former Smoker   Smokeless tobacco: Never Used  Substance and Sexual Activity   Alcohol use: No   Drug use: No   Sexual activity: Not on file    Lifestyle   Physical activity:    Days per week: Not on file    Minutes per session: Not on file   Stress: Not on file  Relationships   Social connections:    Talks on phone: Not on file    Gets together: Not on file    Attends religious service: Not on file    Active member of club or organization: Not on file    Attends meetings of clubs or organizations: Not on file    Relationship status: Not on file   Intimate partner violence:    Fear of current or ex partner: Not on file    Emotionally abused: Not  on file    Physically abused: Not on file    Forced sexual activity: Not on file  Other Topics Concern   Not on file  Social History Narrative   Not on file   Functional Status Survey:    No Known Allergies  Pertinent  Health Maintenance Due  Topic Date Due   DEXA SCAN  04/23/2018 (Originally [birth year]09/1992)   PNA vac Low Risk Adult (1 of 2 - PCV13) 04/23/2018 (Originally 05/09/1990)   INFLUENZA VACCINE  Completed    Medications: Allergies as of 03/29/2018   No Known Allergies     Medication List       Accurate as of March 29, 2018  2:03 PM. Always use your most recent med list.        acetaminophen 500 MG tablet Commonly known as:  TYLENOL Take 500 mg by mouth every 4 (four) hours as needed for mild pain or moderate pain.   bisacodyl 10 MG suppository Commonly known as:  DULCOLAX Constipation (2 of 4): If not relieved by MOM, give 10 mg Bisacodyl suppositiory rectally X 1 dose in 24 hours as needed (Do not use constipation standing orders for residents with renal failure/CFR less than 30. Contact MD for orders)   carvedilol 6.25 MG tablet Commonly known as:  COREG Take 1 tablet (6.25 mg total) by mouth 2 (two) times daily with a meal.   Geritol Liqd Take 10 mLs by mouth daily.   GNP B-COMPLEX PLUS VITAMIN C PO Give one by mouth daily for supplement   iron polysaccharides 150 MG capsule Commonly known as:  NIFEREX Take 1 capsule (150 mg total) by  mouth daily.   levothyroxine 150 MCG tablet Commonly known as:  SYNTHROID, LEVOTHROID Take 1 tablet (150 mcg total) by mouth daily before breakfast. For Hypothyroidism   losartan 25 MG tablet Commonly known as:  COZAAR Take 1 tablet (25 mg total) by mouth daily.   magnesium hydroxide 400 MG/5ML suspension Commonly known as:  MILK OF MAGNESIA Constipation (1 of 4): If no BM in 3 days, give 30 cc Milk of Magnesium p.o. x 1 dose in 24 hours as needed (Do not use standing constipation orders for residents with renal failure CFR less than 30. Contact MD for orders)   NON FORMULARY 124ml Med Pass to promote weight gain   NON FORMULARY MAGIC CUP DAILY   PreserVision AREDS 2 Caps Give one by mouth daily for supplement   RA SALINE ENEMA RE Constipation (3 of 4): If not relieved by Biscodyl suppository, give disposable Saline Enema rectally X 1 dose/24 hrs as needed (Do not use constipation standing orders for residents with renal failure/CFR less than 30. Contact MD for orders)   Constipation (4 of 4): Contact MD as needed if no results from enema (Nursing Measure)       Review of Systems  In general she is not complaining of fever or chills says she feels stronger.  Skin is not complain of rashes or itching she continues to be somewhat pale.  Head ears eyes nose mouth and throat not complaining of a sore throat or visual changes.  Respiratory does not complain of shortness of breath or cough.  Cardiac does not complain of chest pain she does not really appear to have significant edema.  GI not complaining of nausea vomiting diarrhea constipation- does not complain of any acute type abdominal discomfort- she does have some chronic tenderness to palpation of her stomach but this is not new  she attributes this to her enlarged spleen.  GU is not complaining of dysuria.  Musculoskeletal has gained some strength here but still has weakness is not complaining of joint  pain.  Neurologic again positive for weakness which appears to be improving somewhat is not complaining of dizziness headache numbness or syncope.  And psych does not complain of being depressed or anxious is looking forward to going home   Temperature is 96.8 respirations 18  Pulse 70 blood pressure lying 100/64-sitting 100/56- standing 80/48.    Physical Exam  General is a pleasant elderly female in no distress.  Her skin is warm and dry she is chronically pale.  Eyes visual acuity appears to be intact sclera and conjunctive are clear.  Oropharynx is clear mucous membranes appear fairly moist.  Chest is clear to auscultation there is no labored breathing.  Heart is regular rate and rhythm without murmur gallop or rub she does not have significant lower extremity edema.  Abdomen is soft has some mild chronic tenderness palpation she attributes to enlarged spleen but this is not changed from previous exam- bowel sounds are positive-is protuberant  Musculoskeletal is able to move all extremities x4 and does ambulate some with a walker.  Neurologic grossly intact could not really appreciate lateralizing findings her speech is clear.  Psych she is grossly alert and oriented very pleasant and appropriate says she is feeling better   Labs reviewed:  March 29, 2018.  Sodium 135 potassium 4.1 BUN 23 creatinine 0.77 CO2 level 28.  WBC 7.6 hemoglobin 8.9 platelets 79,000  March 24, 2018.  WBC 7.7 hemoglobin 7.9 platelets 77,000.  Sodium 135 potassium 4.2 BUN 21 creatinine 0.63 CO2 level 29 Basic Metabolic Panel: Recent Labs    03/02/18 1553  03/04/18 0712 03/05/18 0559 03/14/18 1359  NA  --    < > 135 132* 132*  K  --    < > 3.8 4.1 4.5  CL  --    < > 99 96* 96*  CO2  --    < > 28 25 28   GLUCOSE  --    < > 87 136* 97  BUN  --    < > 16 17 21   CREATININE  --    < > 0.77 0.89 0.91  CALCIUM  --    < > 7.9* 7.7* 8.3*  MG 2.0  --   --   --   --    < > = values in this  interval not displayed.   Liver Function Tests: Recent Labs    01/05/18 1046 01/11/18 1011 03/14/18 1359  AST 26 22 37  ALT 6 7 8   ALKPHOS 72 76 86  BILITOT 0.8 0.5 1.0  PROT 7.8 7.7 7.1  ALBUMIN 2.8* 2.8* 3.0*   Recent Labs    05/19/17 0956  LIPASE 25   No results for input(s): AMMONIA in the last 8760 hours. CBC: Recent Labs    01/05/18 1046 01/11/18 1011  03/04/18 0712 03/05/18 0559 03/14/18 1359  WBC 20.0* 13.2*   < > 9.7 9.0 8.6  NEUTROABS 5.0 3.8  --   --   --  3.6  HGB 10.6* 10.6*   < > 8.5* 8.7* 8.5*  HCT 32.2* 31.9*   < > 26.0* 26.6* 25.2*  MCV 92.5 90.4   < > 91.9 93.3 90.6  PLT 74* 73*   < > 73* 76* 88*   < > = values in this interval not displayed.  Cardiac Enzymes: No results for input(s): CKTOTAL, CKMB, CKMBINDEX, TROPONINI in the last 8760 hours. BNP: Invalid input(s): POCBNP CBG: No results for input(s): GLUCAP in the last 8760 hours.  Procedures and Imaging Studies During Stay: Dg Chest 2 View  Result Date: 03/01/2018 CLINICAL DATA:  Generalized weakness EXAM: CHEST - 2 VIEW COMPARISON:  05/19/2017 FINDINGS: Cardiac shadow remains enlarged. Tortuous thoracic aorta is noted with mild calcifications. The lungs are well aerated bilaterally. No focal infiltrate or sizable effusion is seen. Chronic compression deformity of the lower thoracic spine is noted. IMPRESSION: Stable cardiomegaly without acute abnormality. Electronically Signed   By: Inez Catalina M.D.   On: 03/01/2018 23:28   Dg Chest Port 1 View  Result Date: 03/04/2018 CLINICAL DATA:  One-week history of shortness of breath that acutely worsened 3 days ago. Former smoker. Current history of CHF. EXAM: PORTABLE CHEST 1 VIEW COMPARISON:  03/01/2018 and earlier. FINDINGS: Cardiac silhouette markedly enlarged, unchanged. Thoracic aorta tortuous and atherosclerotic, unchanged. Hilar and mediastinal contours otherwise unremarkable. Lungs clear. Pulmonary vascularity normal. No pleural effusions.  IMPRESSION: Stable marked cardiomegaly.  No acute cardiopulmonary disease. Electronically Signed   By: Evangeline Dakin M.D.   On: 03/04/2018 09:23   Vas [place] Lower Extremity Venous (dvt)  Result Date: 03/04/2018  Lower Venous Study Indications: Swelling.  Performing Technologist: June Leap RDMS, RVT  Examination Guidelines: A complete evaluation includes B-mode imaging, spectral Doppler, color Doppler, and power Doppler as needed of all accessible portions of each vessel. Bilateral testing is considered an integral part of a complete examination. Limited examinations for reoccurring indications may be performed as noted.  Right Venous Findings: +---------+---------------+---------+-----------+----------+-------+            Compressibility Phasicity Spontaneity Properties Summary  +---------+---------------+---------+-----------+----------+-------+  CFV       Full            Yes       Yes                             +---------+---------------+---------+-----------+----------+-------+  SFJ       Full                                                      +---------+---------------+---------+-----------+----------+-------+  FV Prox   Full                                                      +---------+---------------+---------+-----------+----------+-------+  FV Mid    Full                                                      +---------+---------------+---------+-----------+----------+-------+  FV Distal Full                                                      +---------+---------------+---------+-----------+----------+-------+  PFV       Full                                                      +---------+---------------+---------+-----------+----------+-------+  POP       Full            Yes       Yes                             +---------+---------------+---------+-----------+----------+-------+  PTV       Full                                                       +---------+---------------+---------+-----------+----------+-------+  PERO      Full                                                      +---------+---------------+---------+-----------+----------+-------+  Left Venous Findings: +---------+---------------+---------+-----------+----------+-------+            Compressibility Phasicity Spontaneity Properties Summary  +---------+---------------+---------+-----------+----------+-------+  CFV       Full            Yes       Yes                             +---------+---------------+---------+-----------+----------+-------+  SFJ       Full                                                      +---------+---------------+---------+-----------+----------+-------+  FV Prox   Full                                                      +---------+---------------+---------+-----------+----------+-------+  FV Mid    Full                                                      +---------+---------------+---------+-----------+----------+-------+  FV Distal Full                                                      +---------+---------------+---------+-----------+----------+-------+  PFV       Full                                                      +---------+---------------+---------+-----------+----------+-------+  POP       Full            Yes       Yes                             +---------+---------------+---------+-----------+----------+-------+  PTV       Full                                                      +---------+---------------+---------+-----------+----------+-------+  PERO      Full                                                      +---------+---------------+---------+-----------+----------+-------+    Summary: Right: There is no evidence of deep vein thrombosis in the lower extremity. No cystic structure found in the popliteal fossa. Left: There is no evidence of deep vein thrombosis in the lower extremity. No cystic structure found in the popliteal fossa.  *See  table(s) above for measurements and observations. Electronically signed by Ruta Hinds MD on 03/04/2018 at 2:24:53 PM.    Final     Assessment/Plan:   #1- history of hypothyroidism again TSH is significantly elevated medication adjustments have been made she will need an updated TSH in about 3 weeks-this can be done by primary care provider last TSH was around 50 her Synthroid has been increased up to 150 mcg a day.  2.  History of lymphoma she is followed by Dr. [name] at: Oncology- continues to have some chronic tenderness to her abdomen not new.  She saw oncology on February 26 labs are thought to be stable recommendation to continue her iron supplementation and possible CT scan in about 3 months-she should have a follow-up appointment in about 2 months.  3.  History of anemia with a history of chronicity as noted above she is followed by oncology hemoglobin did go down slightly to 7.9 over the weekend but now is back to 8.9-this will need follow-up by oncology she is on iron supplementation including Niferex as well as Geritol.  4.  History of chronic systolic HF with cardiomyopathy-- low-dose Demadex with potassium is on hold it has been on hold now for approximately a week because of family concerns of increased weakness-she does not have any significant increased edema or weight gain compared to when I saw her last week-at this point will continue to hold Demadex with follow-up by primary care provider clinically she appears to be doing a bit better.  5.  Hypertension appears at times she is has some low blood pressures although that she does not appear to be overtly symptomatic although this could be contributing to weakness- talking with her daughter Olin Hauser apparently she has at times held her Coreg and only gave it once a day-will reduce the Coreg down to 3.125 mg twice daily and see if this will help with her blood pressures-she also is on Cozaar 25 mg a day. I have also spoken with her  daughter via phone-about holding her losartan as well if blood pressures run low-her daughter did express understanding-her daughter is very involved This will warrant  follow-up by primary care provider.  She again will be going home with family she will need home health support with nursing to help manage her medical issues she also need a bedside commode and a wheelchair.  She does appear to be improved today.  [id number] note greater than 30 minutes spent on this discharge summary- greater than 50% of time spent coordinating a plan of care for numerous diagnoses

## 2018-03-30 MED ORDER — LEVOTHYROXINE SODIUM 150 MCG PO TABS: 150.0000 ug | ORAL_TABLET | Freq: Every day | ORAL | 0 refills | Status: AC

## 2018-03-30 MED ORDER — GERITOL TONIC PO LIQD: 10.0000 mL | Freq: Every day | ORAL | 0 refills | Status: DC

## 2018-03-30 MED ORDER — POLYSACCHARIDE IRON COMPLEX 150 MG PO CAPS: 150.0000 mg | ORAL_CAPSULE | Freq: Every day | ORAL | 0 refills | Status: DC

## 2018-03-30 MED ORDER — CARVEDILOL 6.25 MG PO TABS: 3.1250 mg | ORAL_TABLET | Freq: Two times a day (BID) | ORAL | 0 refills | Status: AC

## 2018-03-30 MED ORDER — LOSARTAN POTASSIUM 25 MG PO TABS: 25.0000 mg | ORAL_TABLET | Freq: Every day | ORAL | 0 refills | Status: DC

## 2018-04-26 ENCOUNTER — Telehealth: Payer: Self-pay | Admitting: *Deleted

## 2018-04-26 NOTE — Telephone Encounter (Signed)
Appt calendar faxed to [facility] as requested [phone] - attn Gerald Stabs. Fax confirmation received

## 2018-05-08 ENCOUNTER — Other Ambulatory Visit: Payer: Self-pay | Admitting: Cardiovascular Disease

## 2018-05-09 ENCOUNTER — Other Ambulatory Visit: Payer: Self-pay

## 2018-05-09 ENCOUNTER — Encounter (HOSPITAL_COMMUNITY): Payer: Self-pay

## 2018-05-09 ENCOUNTER — Emergency Department (HOSPITAL_COMMUNITY): Payer: Medicare Other

## 2018-05-09 ENCOUNTER — Inpatient Hospital Stay (HOSPITAL_COMMUNITY)
Admission: EM | Admit: 2018-05-09 | Discharge: 2018-05-15 | DRG: 536 | Disposition: A | Discharge status: Skilled Nursing Facility | Payer: Medicare Other | Attending: Internal Medicine | Admitting: Internal Medicine

## 2018-05-09 DIAGNOSIS — Z96649 Presence of unspecified artificial hip joint: Secondary | ICD-10-CM

## 2018-05-09 DIAGNOSIS — M9702XA Periprosthetic fracture around internal prosthetic left hip joint, initial encounter: Secondary | ICD-10-CM | POA: Diagnosis present

## 2018-05-09 DIAGNOSIS — R339 Retention of urine, unspecified: Secondary | ICD-10-CM | POA: Diagnosis not present

## 2018-05-09 DIAGNOSIS — Z8249 Family history of ischemic heart disease and other diseases of the circulatory system: Secondary | ICD-10-CM

## 2018-05-09 DIAGNOSIS — I440 Atrioventricular block, first degree: Secondary | ICD-10-CM | POA: Diagnosis present

## 2018-05-09 DIAGNOSIS — D696 Thrombocytopenia, unspecified: Secondary | ICD-10-CM | POA: Diagnosis present

## 2018-05-09 DIAGNOSIS — W19XXXA Unspecified fall, initial encounter: Secondary | ICD-10-CM | POA: Diagnosis not present

## 2018-05-09 DIAGNOSIS — C8300 Small cell B-cell lymphoma, unspecified site: Secondary | ICD-10-CM | POA: Diagnosis present

## 2018-05-09 DIAGNOSIS — D731 Hypersplenism: Secondary | ICD-10-CM | POA: Diagnosis present

## 2018-05-09 DIAGNOSIS — D638 Anemia in other chronic diseases classified elsewhere: Secondary | ICD-10-CM | POA: Diagnosis present

## 2018-05-09 DIAGNOSIS — R946 Abnormal results of thyroid function studies: Secondary | ICD-10-CM | POA: Diagnosis present

## 2018-05-09 DIAGNOSIS — C8307 Small cell B-cell lymphoma, spleen: Secondary | ICD-10-CM

## 2018-05-09 DIAGNOSIS — Z66 Do not resuscitate: Secondary | ICD-10-CM | POA: Diagnosis present

## 2018-05-09 DIAGNOSIS — I5022 Chronic systolic (congestive) heart failure: Secondary | ICD-10-CM | POA: Diagnosis present

## 2018-05-09 DIAGNOSIS — W010XXA Fall on same level from slipping, tripping and stumbling without subsequent striking against object, initial encounter: Secondary | ICD-10-CM | POA: Diagnosis present

## 2018-05-09 DIAGNOSIS — Z8781 Personal history of (healed) traumatic fracture: Secondary | ICD-10-CM

## 2018-05-09 DIAGNOSIS — C8590 Non-Hodgkin lymphoma, unspecified, unspecified site: Secondary | ICD-10-CM | POA: Diagnosis not present

## 2018-05-09 DIAGNOSIS — M1990 Unspecified osteoarthritis, unspecified site: Secondary | ICD-10-CM | POA: Diagnosis present

## 2018-05-09 DIAGNOSIS — W0110XA Fall on same level from slipping, tripping and stumbling with subsequent striking against unspecified object, initial encounter: Secondary | ICD-10-CM

## 2018-05-09 DIAGNOSIS — I447 Left bundle-branch block, unspecified: Secondary | ICD-10-CM | POA: Diagnosis present

## 2018-05-09 DIAGNOSIS — D509 Iron deficiency anemia, unspecified: Secondary | ICD-10-CM | POA: Diagnosis present

## 2018-05-09 DIAGNOSIS — Y92002 Bathroom of unspecified non-institutional (private) residence single-family (private) house as the place of occurrence of the external cause: Secondary | ICD-10-CM | POA: Diagnosis not present

## 2018-05-09 DIAGNOSIS — D649 Anemia, unspecified: Secondary | ICD-10-CM | POA: Diagnosis not present

## 2018-05-09 DIAGNOSIS — D62 Acute posthemorrhagic anemia: Secondary | ICD-10-CM | POA: Diagnosis present

## 2018-05-09 DIAGNOSIS — E039 Hypothyroidism, unspecified: Secondary | ICD-10-CM | POA: Diagnosis present

## 2018-05-09 DIAGNOSIS — Z960 Presence of urogenital implants: Secondary | ICD-10-CM | POA: Diagnosis not present

## 2018-05-09 DIAGNOSIS — S72142A Displaced intertrochanteric fracture of left femur, initial encounter for closed fracture: Secondary | ICD-10-CM | POA: Diagnosis present

## 2018-05-09 DIAGNOSIS — S72002A Fracture of unspecified part of neck of left femur, initial encounter for closed fracture: Secondary | ICD-10-CM

## 2018-05-09 DIAGNOSIS — Y9301 Activity, walking, marching and hiking: Secondary | ICD-10-CM | POA: Diagnosis present

## 2018-05-09 DIAGNOSIS — Z87891 Personal history of nicotine dependence: Secondary | ICD-10-CM | POA: Diagnosis not present

## 2018-05-09 DIAGNOSIS — I110 Hypertensive heart disease with heart failure: Secondary | ICD-10-CM | POA: Diagnosis present

## 2018-05-09 DIAGNOSIS — Z9889 Other specified postprocedural states: Secondary | ICD-10-CM | POA: Diagnosis not present

## 2018-05-09 DIAGNOSIS — Z79899 Other long term (current) drug therapy: Secondary | ICD-10-CM

## 2018-05-09 DIAGNOSIS — M978XXA Periprosthetic fracture around other internal prosthetic joint, initial encounter: Secondary | ICD-10-CM

## 2018-05-09 DIAGNOSIS — Z87311 Personal history of (healed) other pathological fracture: Secondary | ICD-10-CM

## 2018-05-09 DIAGNOSIS — I5020 Unspecified systolic (congestive) heart failure: Secondary | ICD-10-CM | POA: Diagnosis not present

## 2018-05-09 DIAGNOSIS — M25562 Pain in left knee: Secondary | ICD-10-CM | POA: Diagnosis present

## 2018-05-09 DIAGNOSIS — Z79890 Hormone replacement therapy (postmenopausal): Secondary | ICD-10-CM

## 2018-05-09 LAB — BASIC METABOLIC PANEL
Anion gap: 10 (ref 5–15)
BUN: 17 mg/dL (ref 8–23)
CO2: 26 mmol/L (ref 22–32)
Calcium: 8.1 mg/dL — ABNORMAL LOW (ref 8.9–10.3)
Chloride: 99 mmol/L (ref 98–111)
Creatinine, Ser: 0.76 mg/dL (ref 0.44–1.00)
GFR calc Af Amer: >60 mL/min (ref >60)
GFR calc non Af Amer: >60 mL/min (ref >60)
Glucose, Bld: 102 mg/dL — ABNORMAL HIGH (ref 70–99)
Potassium: 4.3 mmol/L (ref 3.5–5.1)
Sodium: 135 mmol/L (ref 135–145)

## 2018-05-09 LAB — CBC WITH DIFFERENTIAL/PLATELET
Abs Immature Granulocytes: 0.02 10*3/uL (ref 0.00–0.07)
Basophils Absolute: 0 10*3/uL (ref 0.0–0.1)
Basophils Relative: 0 %
Eosinophils Absolute: 0 10*3/uL (ref 0.0–0.5)
Eosinophils Relative: 0 %
HCT: 23.2 % — ABNORMAL LOW (ref 36.0–46.0)
Hemoglobin: 7.3 g/dL — ABNORMAL LOW (ref 12.0–15.0)
Immature Granulocytes: 0 %
Lymphocytes Relative: 38 %
Lymphs Abs: 2.6 10*3/uL (ref 0.7–4.0)
MCH: 28.9 pg (ref 26.0–34.0)
MCHC: 31.5 g/dL (ref 30.0–36.0)
MCV: 91.7 fL (ref 80.0–100.0)
Monocytes Absolute: 2.4 10*3/uL — ABNORMAL HIGH (ref 0.1–1.0)
Monocytes Relative: 35 %
Neutro Abs: 1.9 10*3/uL (ref 1.7–7.7)
Neutrophils Relative %: 27 %
Platelets: 90 10*3/uL — ABNORMAL LOW (ref 150–400)
RBC: 2.53 MIL/uL — ABNORMAL LOW (ref 3.87–5.11)
RDW: 16.1 % — ABNORMAL HIGH (ref 11.5–15.5)
WBC: 6.9 10*3/uL (ref 4.0–10.5)
nRBC: 0 % (ref 0.0–0.2)

## 2018-05-09 LAB — RETICULOCYTES
Immature Retic Fract: 22.6 % — ABNORMAL HIGH (ref 2.3–15.9)
RBC.: 2.31 MIL/uL — ABNORMAL LOW (ref 3.87–5.11)
Retic Count, Absolute: 111.1 10*3/uL (ref 19.0–186.0)
Retic Ct Pct: 4.8 % — ABNORMAL HIGH (ref 0.4–3.1)

## 2018-05-09 LAB — IRON AND TIBC
Iron: 31 ug/dL (ref 28–170)
Saturation Ratios: 11 % (ref 10.4–31.8)
TIBC: 279 ug/dL (ref 250–450)
UIBC: 248 ug/dL

## 2018-05-09 LAB — PROTIME-INR
INR: 1.1 (ref 0.8–1.2)
Prothrombin Time: 13.8 seconds (ref 11.4–15.2)

## 2018-05-09 LAB — FERRITIN: Ferritin: 184 ng/mL (ref 11–307)

## 2018-05-09 MED ORDER — FENTANYL CITRATE (PF) 100 MCG/2ML IJ SOLN
50.0000 ug | INTRAMUSCULAR | Status: DC | PRN
  Administered 2018-05-09: 50 ug via INTRAVENOUS
  Filled 2018-05-09: qty 2

## 2018-05-09 MED ORDER — FENTANYL CITRATE (PF) 100 MCG/2ML IJ SOLN
INTRAMUSCULAR | Status: AC
  Filled 2018-05-09: qty 2

## 2018-05-09 MED ORDER — ACETAMINOPHEN 325 MG PO TABS
650.0000 mg | ORAL_TABLET | Freq: Once | ORAL | Status: AC
  Administered 2018-05-09: 650 mg via ORAL
  Filled 2018-05-09: qty 2

## 2018-05-09 MED ORDER — ACETAMINOPHEN 325 MG PO TABS
650.0000 mg | ORAL_TABLET | Freq: Four times a day (QID) | ORAL | Status: DC
  Administered 2018-05-09 – 2018-05-15 (×24): 650 mg via ORAL
  Filled 2018-05-09 (×24): qty 2

## 2018-05-09 MED ORDER — LEVOTHYROXINE SODIUM 75 MCG PO TABS
150.0000 ug | ORAL_TABLET | Freq: Every day | ORAL | Status: DC
  Administered 2018-05-10 – 2018-05-15 (×6): 150 ug via ORAL
  Filled 2018-05-09 (×8): qty 2

## 2018-05-09 MED ORDER — FENTANYL CITRATE (PF) 100 MCG/2ML IJ SOLN
25.0000 ug | INTRAMUSCULAR | Status: DC | PRN
  Filled 2018-05-09 (×2): qty 2

## 2018-05-09 MED ORDER — PNEUMOCOCCAL VAC POLYVALENT 25 MCG/0.5ML IJ INJ
0.5000 mL | INJECTION | INTRAMUSCULAR | Status: DC
  Filled 2018-05-09: qty 0.5

## 2018-05-09 MED ORDER — SODIUM CHLORIDE 0.9 % IV BOLUS
500.0000 mL | Freq: Once | INTRAVENOUS | Status: AC
  Administered 2018-05-09: 500 mL via INTRAVENOUS

## 2018-05-09 MED ORDER — TORSEMIDE 10 MG PO TABS
10.0000 mg | ORAL_TABLET | Freq: Every day | ORAL | Status: DC | PRN
  Filled 2018-05-09: qty 1

## 2018-05-09 MED ORDER — ENOXAPARIN SODIUM 40 MG/0.4ML ~~LOC~~ SOLN
40.0000 mg | SUBCUTANEOUS | Status: DC
  Administered 2018-05-09 – 2018-05-14 (×5): 40 mg via SUBCUTANEOUS
  Filled 2018-05-09 (×5): qty 0.4

## 2018-05-09 NOTE — Progress Notes (Signed)
Pt arrived to room 6N26 via stretcher. Received report from Lovena Le, [place] in the ED. See assessment. Will continue to monitor.

## 2018-05-09 NOTE — H&P (Addendum)
Date: 05/09/2018               Patient Name:  Kathleen Arias MRN: [id number]  DOB: [birth year] Age / Sex: 85 y.o., female   PCP: [name], MD         Medical Service: Internal Medicine Teaching Service         Attending Physician: Dr. Lucious Groves, DO    First Contact: Dr. Lonia Skinner Pager: [phone]  Second Contact: Dr. Lars Mage Pager: [phone]       After Hours (After 5p/  First Contact Pager: [phone]  weekends / holidays): Second Contact Pager: [phone]   Chief Complaint: hip pain  History of Present Illness: 85 yo female with PMH sig for HFrEF, non hodgkin's lymphoma, hypothyroidism, presented to ED after a mechanical fall onto her left hip resulting in a periprosthetic hip fracture at the proximal femur.  She was getting up during the middle of the night was half asleep and lost her footing.  Prior hip fracture in 2006 while dancing, lost her footing then.  No LOC, did not feel dizzy beforehand, no associated palpitations.  Today she is not dyspneic no chest pain, no n/v/diarrhea.    ED course: x-ray demonstrated periprosthetic hip fracture on left proximal femur, ortho consulted will try to avoid surgery if possible CT ordered for further delineation.    Meds:  Current Meds  Medication Sig  . acetaminophen (TYLENOL) 500 MG tablet Take 500 mg by mouth every 4 (four) hours as needed for mild pain or moderate pain.  . B Complex-C-Calcium (GNP B-COMPLEX PLUS VITAMIN C PO) Give one by mouth daily for supplement  . carvedilol (COREG) 6.25 MG tablet Take 0.5 tablets (3.125 mg total) by mouth 2 (two) times daily with a meal. (Patient taking differently: Take 6.25 mg by mouth daily. )  . levothyroxine (SYNTHROID, LEVOTHROID) 150 MCG tablet Take 1 tablet (150 mcg total) by mouth daily before breakfast. For Hypothyroidism (Patient taking differently: Take 150 mcg by mouth daily before breakfast. )  . losartan (COZAAR) 25 MG tablet TAKE 1 TABLET(25 MG) BY MOUTH DAILY  (Patient taking differently: Take 25 mg by mouth daily. )  . Multiple Vitamins-Minerals (PRESERVISION AREDS 2) CAPS Give one by mouth daily for supplement  . torsemide (DEMADEX) 10 MG tablet Take 10 mg by mouth daily as needed (fluid).     Allergies: Allergies as of 05/09/2018  . (No Known Allergies)   Past Medical History:  Diagnosis Date  . Anemia   . Anginal pain ([place])   . Arthritis   . Cancer ([place])   . CHF (congestive heart failure) ([place])   . Dyspnea   . Dysrhythmia   . Hypertension   . Hypothyroidism   . Thyroid disease     Family History:  Family History  Problem Relation Age of Onset  . Heart disease Mother     Social History:  Social History   Socioeconomic History  . Marital status: Single    Spouse name: Not on file  . Number of children: Not on file  . Years of education: Not on file  . Highest education level: Not on file  Occupational History  . Not on file  Social Needs  . Financial resource strain: Not on file  . Food insecurity:    Worry: Not on file    Inability: Not on file  . Transportation needs:    Medical: Not on file    Non-medical: Not  on file  Tobacco Use  . Smoking status: Former Research scientist (life sciences)  . Smokeless tobacco: Never Used  Substance and Sexual Activity  . Alcohol use: No  . Drug use: No  . Sexual activity: Not on file  Lifestyle  . Physical activity:    Days per week: Not on file    Minutes per session: Not on file  . Stress: Not on file  Relationships  . Social connections:    Talks on phone: Not on file    Gets together: Not on file    Attends religious service: Not on file    Active member of club or organization: Not on file    Attends meetings of clubs or organizations: Not on file    Relationship status: Not on file  . Intimate partner violence:    Fear of current or ex partner: Not on file    Emotionally abused: Not on file    Physically abused: Not on file    Forced sexual activity: Not on file  Other Topics  Concern  . Not on file  Social History Narrative  . Not on file    Review of Systems: A complete ROS was negative except as per HPI.   Physical Exam: Blood pressure (!) 103/50, pulse 71, temperature 97.6 F (36.4 C), temperature source Oral, resp. rate 16, SpO2 95 %. Physical Exam Constitutional:      General: She is not in acute distress.    Appearance: She is not diaphoretic.  HENT:     Head: Normocephalic and atraumatic.     Nose: No congestion or rhinorrhea.  Eyes:     General:        Right eye: No discharge.        Left eye: No discharge.  Neck:     Musculoskeletal: Neck supple.  Cardiovascular:     Rate and Rhythm: Normal rate and regular rhythm.     Heart sounds: Normal heart sounds. No murmur. No friction rub. No gallop.   Pulmonary:     Effort: Pulmonary effort is normal. No respiratory distress.     Breath sounds: Normal breath sounds. No wheezing or rales.  Chest:     Chest wall: No tenderness.  Abdominal:     General: Bowel sounds are normal. There is no distension.     Palpations: Abdomen is soft. There is no mass.     Tenderness: There is no abdominal tenderness. There is no guarding or rebound.  Musculoskeletal:     Right lower leg: Edema present.     Left lower leg: Edema present.     Comments: Area overlying left hip without ecchymosis, is tender to palpation.    Skin:    General: Skin is warm and dry.  Neurological:     Mental Status: Mental status is at baseline.  Psychiatric:        Mood and Affect: Mood normal.    EKG: personally reviewed my interpretation is NSR normal rate, LBBB, borderline 1st degree av block  CXR: personally reviewed my interpretation is no active disease  Assessment & Plan by Problem: Active Problems:   Closed left hip fracture (HCC)  Closed left hip fracture: appears to be from a mechanical fall based on patient's history although this is not her first fall, chart review suggests she may have been having problems  with dizziness and unsteadiness.    -orthopedic surgery per nurse report will plan on not intervening surgically -PT/OT  HFrEF: volume status appears stable  at this time  -restart HF therapy when bp improves -IV iron infusion  HTN: initially normotensive on arrival, bp down some later in the morning did receive fentanyl.  Ortho ordered 500cc bolus  -continue to monitor hold bp meds for now  Normocytic anemia/Thrombocytopenia: on chart review seems to have an established iron deficiency.  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  May have been some minimal loss from fracture but no visible hematoma.  She has already been typed and screened.  Platelet count is stable.    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  Has been receiving Rituxan, spleen size has been decreasing, WBC count has normalized, platelet count stable.    -she will continue to follow up with Dr. [name] -no acute changes to management  Hypothyroidism: last TSH elevated  -Continue home synthroid -check TSH and free T4   Dispo: Admit patient to Inpatient with expected length of stay greater than 2 midnights.  Signed: Katherine Roan, MD 05/09/2018, 12:42 PM

## 2018-05-09 NOTE — ED Notes (Signed)
Daughter Annye English  [phone]

## 2018-05-09 NOTE — ED Triage Notes (Signed)
EMS- pt coming from home with complaint of mechanical fall. Hx of left hip replacement and c/o pain in the area. No obvious shortening or rotation. Pt has some discoloration in her feet which is normal per family.   140/60 HR 70 RR 16 Pain 6/10

## 2018-05-09 NOTE — ED Notes (Signed)
ED TO INPATIENT HANDOFF REPORT  ED Nurse Name and Phone #: Marya Amsler RN [phone]  S Name/Age/Gender Kathleen Arias 85 y.o. female Room/Bed: 022C/022C  Code Status   Code Status: Prior  Home/SNF/Other Home Patient oriented to: Alert x 4 Is this baseline? Yes   Triage Complete: Triage complete  Chief Complaint Fall; Left Hip Pain  Triage Note EMS- pt coming from home with complaint of mechanical fall. Hx of left hip replacement and c/o pain in the area. No obvious shortening or rotation. Pt has some discoloration in her feet which is normal per family.   140/60 HR 70 RR 16 Pain 6/10   Allergies No Known Allergies  Level of Care/Admitting Diagnosis ED Disposition    ED Disposition Condition Comment   Admit  The patient appears reasonably stabilized for admission considering the current resources, flow, and capabilities available in the ED at this time, and I doubt any other [facility] requiring further screening and/or treatment in the ED prior to admission is  present.       B Medical/Surgery History Past Medical History:  Diagnosis Date  . Anemia   . Anginal pain ([place])   . Arthritis   . Cancer ([place])   . CHF (congestive heart failure) ([place])   . Dyspnea   . Dysrhythmia   . Hypertension   . Hypothyroidism   . Thyroid disease    Past Surgical History:  Procedure Laterality Date  . CARDIAC CATHETERIZATION N/A 01/07/2016   Procedure: Left Heart Cath and Coronary Angiography;  Surgeon: Jettie Booze, MD;  Location: [facility];  Service: Cardiovascular;  Laterality: N/A;  . CATARACT EXTRACTION, BILATERAL    . FRACTURE SURGERY    . HIP SURGERY    . JOINT REPLACEMENT    . TONSILLECTOMY       A IV Location/Drains/Wounds Patient Lines/Drains/Airways Status   Active Line/Drains/Airways    Name:   Placement date:   Placement time:   Site:   Days:   Peripheral IV 05/09/18 Right Forearm   05/09/18    0913    Forearm   less than 1          Intake/Output  Last 24 hours No intake or output data in the 24 hours ending 05/09/18 1023  Labs/Imaging Results for orders placed or performed during the hospital encounter of 05/09/18 (from the past 48 hour(s))  Basic metabolic panel     Status: Abnormal   Collection Time: 05/09/18  9:00 AM  Result Value Ref Range   Sodium 135 135 - 145 mmol/L   Potassium 4.3 3.5 - 5.1 mmol/L   Chloride 99 98 - 111 mmol/L   CO2 26 22 - 32 mmol/L   Glucose, Bld 102 (H) 70 - 99 mg/dL   BUN 17 8 - 23 mg/dL   Creatinine, Ser 0.76 0.44 - 1.00 mg/dL   Calcium 8.1 (L) 8.9 - 10.3 mg/dL   GFR calc non Af Amer >60 >60 mL/min   GFR calc Af Amer >60 >60 mL/min   Anion gap 10 5 - 15    Comment: Performed at [facility], [place] [address], [place], [place] [address]  CBC WITH DIFFERENTIAL     Status: Abnormal   Collection Time: 05/09/18  9:00 AM  Result Value Ref Range   WBC 6.9 4.0 - 10.5 K/uL   RBC 2.53 (L) 3.87 - 5.11 MIL/uL   Hemoglobin 7.3 (L) 12.0 - 15.0 g/dL   HCT 23.2 (L) 36.0 -  46.0 %   MCV 91.7 80.0 - 100.0 fL   MCH 28.9 26.0 - 34.0 pg   MCHC 31.5 30.0 - 36.0 g/dL   RDW 16.1 (H) 11.5 - 15.5 %   Platelets 90 (L) 150 - 400 K/uL    Comment: REPEATED TO VERIFY PLATELET COUNT CONFIRMED BY SMEAR Immature Platelet Fraction may be clinically indicated, consider ordering this additional test [id number]    nRBC 0.0 0.0 - 0.2 %   Neutrophils Relative % 27 %   Neutro Abs 1.9 1.7 - 7.7 K/uL   Lymphocytes Relative 38 %   Lymphs Abs 2.6 0.7 - 4.0 K/uL   Monocytes Relative 35 %   Monocytes Absolute 2.4 (H) 0.1 - 1.0 K/uL   Eosinophils Relative 0 %   Eosinophils Absolute 0.0 0.0 - 0.5 K/uL   Basophils Relative 0 %   Basophils Absolute 0.0 0.0 - 0.1 K/uL   Immature Granulocytes 0 %   Abs Immature Granulocytes 0.02 0.00 - 0.07 K/uL    Comment: Performed at [facility], [address], [place], [place] [address]  Protime-INR     Status: None   Collection Time: 05/09/18  9:00 AM  Result Value Ref Range    Prothrombin Time 13.8 11.4 - 15.2 seconds   INR 1.1 0.8 - 1.2    Comment: (NOTE) INR goal varies based on device and disease states. Performed at [facility], [place] [address] [place], [place] [address]   Type and screen [place]     Status: None   Collection Time: 05/09/18  9:08 AM  Result Value Ref Range   ABO/RH(D) O NEG    Antibody Screen NEG    Sample Expiration      05/12/2018 Performed at [facility], [place] [address], [place], [place] [address]    Dg Chest Portable 1 View  Result Date: 05/09/2018 CLINICAL DATA:  Pain following trauma. Preoperative for proximal femur fracture EXAM: PORTABLE CHEST 1 VIEW COMPARISON:  March 04, 2018 FINDINGS: There is no appreciable edema or consolidation. There is cardiomegaly with pulmonary vascularity normal. No adenopathy. There is aortic atherosclerosis. Bones are osteoporotic. No pneumothorax. No acute fracture evident. IMPRESSION: No edema or consolidation. Stable cardiomegaly. Bones osteoporotic. No pneumothorax. Aortic Atherosclerosis (ICD10-I70.0). Electronically Signed   By: Lowella Grip III M.D.   On: 05/09/2018 09:29   Dg Hip Unilat W Or Wo Pelvis 2-3 Views Left  Result Date: 05/09/2018 CLINICAL DATA:  Left hip pain after fall today. EXAM: DG HIP (WITH OR WITHOUT PELVIS) 2-3V LEFT COMPARISON:  CT scan of May 19, 2017. FINDINGS: Status post left hip arthroplasty. No dislocation is noted. However, there is interval development of moderately displaced and probably comminuted periprosthetic fracture involving the lateral portion of the intertrochanteric region of the proximal left femur. IMPRESSION: Moderately displaced and probably comminuted periprosthetic fracture is seen involving the lateral intertrochanteric region of the proximal left femur. Electronically Signed   By: Marijo Conception M.D.   On: 05/09/2018 08:50   Dg Femur Min 2 Views Left  Result Date: 05/09/2018 CLINICAL DATA:  Left hip pain  after fall today. EXAM: LEFT FEMUR 2 VIEWS COMPARISON:  CT scan of May 19, 2017. FINDINGS: Status post left hip arthroplasty. There is interval development of moderately displaced and comminuted periprosthetic fracture involving lateral portion of intertrochanteric region of proximal left femur. The middle and distal portions of the femur are unremarkable. Vascular calcifications are noted. IMPRESSION: Moderately displaced  and comminuted periprosthetic fracture involving intertrochanteric region of proximal left femur. Electronically Signed   By: Marijo Conception M.D.   On: 05/09/2018 08:52    Pending Labs Unresulted Labs (From admission, onward)    Start     Ordered   05/09/18 0900  Pathologist smear review  Once,   STAT     05/09/18 0900          Vitals/Pain Today's Vitals   05/09/18 0905 05/09/18 0915 05/09/18 0950 05/09/18 1000  BP: (!) 113/56     Pulse: 78   69  Resp: 20   (!) 22  Temp:      TempSrc:      SpO2: 95%   95%  PainSc:  6  0-No pain     Isolation Precautions No active isolations  Medications Medications  fentaNYL (SUBLIMAZE) injection 50 mcg (50 mcg Intravenous Given 05/09/18 0923)  acetaminophen (TYLENOL) tablet 650 mg (650 mg Oral Given 05/09/18 0847)    Mobility walks with device    Focused Assessments    R Recommendations: See Admitting Provider Note  Report given to:   Additional Notes:

## 2018-05-09 NOTE — ED Notes (Signed)
Ortho PA at bedside.  

## 2018-05-09 NOTE — Discharge Summary (Signed)
Name: Kathleen Arias MRN: [id number] DOB: [birth year] 85 y.o. PCP: [name], MD  Date of Admission: 05/09/2018  7:49 AM Date of Discharge: 05/15/2018 Attending Physician: Oda Kilts, MD  Discharge Diagnosis: 1. Displaced periprosthetic fracture of left femur 2. Normocytic anemia/non-Hodgkin's lymphoma 3. Acute urinary retention 4. Hypothyroidism 5. Heart failure with reduced ejection fraction 6. Hypertension   Discharge Medications: Allergies as of 05/15/2018   No Known Allergies     Medication List    STOP taking these medications   Geritol Liqd     TAKE these medications   acetaminophen 500 MG tablet Commonly known as:  TYLENOL Take 500 mg by mouth every 4 (four) hours as needed for mild pain or moderate pain.   carvedilol 6.25 MG tablet Commonly known as:  COREG Take 0.5 tablets (3.125 mg total) by mouth 2 (two) times daily with a meal. What changed:    how much to take  when to take this   GNP B-COMPLEX PLUS VITAMIN C PO Give one by mouth daily for supplement   iron polysaccharides 150 MG capsule Commonly known as:  NIFEREX Take 1 capsule (150 mg total) by mouth daily.   levothyroxine 150 MCG tablet Commonly known as:  SYNTHROID Take 1 tablet (150 mcg total) by mouth daily before breakfast. For Hypothyroidism What changed:  additional instructions   losartan 25 MG tablet Commonly known as:  COZAAR TAKE 1 TABLET(25 MG) BY MOUTH DAILY What changed:  See the new instructions.   PreserVision AREDS 2 Caps Give one by mouth daily for supplement   torsemide 10 MG tablet Commonly known as:  DEMADEX Take 1 tablet (10 mg total) by mouth daily. What changed:    when to take this  reasons to take this       Disposition and follow-up:   Ms.Kathleen Arias was discharged from [facility] in Stable condition.  At the hospital follow up visit please address:  1.  Ensure that she is getting regular physical therapy.  2.  Evaluate volume status. She was slightly hypervolemic on exam. Her Torsemide was changed from PRN to scheduled.   3.  Labs / imaging needed at time of follow-up: Right hip x-ray--to be done at orthopedic surgery follow-up.  4.  Pending labs/ test needing follow-up: None  Follow-up Appointments: Follow-up Information    [facility] Follow up.   Specialty:  [facility] Contact information: [place] [address] [phone]        Renette Butters, MD Follow up.   Specialty:  Orthopedic Surgery Why:  The office will call you to make an appointment in approximately 1 week.  Contact information: [address] [place] [address] [place] [address] [phone]           Hospital Course by problem list: 1. Displaced periprosthetic fracture of left femur: Patient had a mechanical fall on her left hip after she slipped and fell.  She had a prior hip fracture in 2006 while dancing which was replaced in [place]. Patient has not been on any bisphosphonates since prior fracture. CT imaging obtained showed a closed left hip fracture. Orthopedic surgery recommended non-operative management with pain control and physical therapy. During her admission, her pain was well-controlled on Tylenol. She was discharged to nursing fascility where she is recommended to continue physical therapy. She will follow-up with Dr. Debroah Loop (ortho) office in 1 to 2 weeks to have repeat x-rays performed.  2. Normocytic anemia/non-Hodgkin's lymphoma: She has a history of splenic marginal B-cell lymphoma and is being treated with Rituxan. During her admission Hb decreased to <7. Labs suggested that her anemia is likely secondary to a combination of iron deficiency anemia and underlying lymphoma.  She received 2 units pRBC as well as received IV ferriheme. Her discharge Hb was stable at 8.4.  3. Acute urinary retention: She had acute urinary retention likely due to  opioid use. She initially had a foley placed but this was eventually removed and she passed a spontaneous voiding trial.  4. Hypothyroidism: Initial TSH elevated to 13 but free T4 was within normal limits. She believed that she was being treated appropriately and her home levothyroxine 50 mcg was continued throughout admission.   5. Heart failure with reduced ejection fraction: She developed mild hypervolemia during her admission. Her home Torsemide PRN was changed to once daily.  Her home carvedilol and losartan were also continued.   6. Hypertension:  Stable throughout admission. His home carvedilol and losartan was restarted prior to discharge. Please ensure that she continues taking both of these medications.    Discharge Vitals:   BP (!) 127/53 (BP Location: Left Arm)   Pulse 72   Temp (!) 97.3 F (36.3 C) (Oral)   Resp 17   Ht 5\' 3"  (1.6 m)   Wt 51 kg   SpO2 96%   BMI 19.91 kg/m   Pertinent Labs, Studies, and Procedures:   CT hip without contrast 05/09/2018 Mildly displaced periprosthetic fracture of the left femur extending to approximately 7.5 cm craniocaudal from the top of the greater trochanter.  Left knee x-ray 05/09/2018 Tricompartmental osteoarthritis that is worst in the medial region.  Mildly depressed medial tibial plateau fracture.  Chest x-ray 05/09/2018 No acute abnormalities   Left femur x-ray 05/09/2018 Moderately displaced and comminuted periprosthetic fracture involving the intertrochanteric region of the proximal left femur   Hip x-ray 05/09/2018 Moderately displaced and probable comminuted periprosthetic fracture involving lateral intertrochanteric region of the proximal left femur  CBC Latest Ref Rng & Units 05/12/2018 05/11/2018 05/11/2018  WBC 4.0 - 10.5 K/uL - - 7.0  Hemoglobin 12.0 - 15.0 g/dL 8.4(L) 8.4(L) 6.8(LL)  Hematocrit 36.0 - 46.0 % 24.4(L) 25.2(L) 20.3(L)  Platelets 150 - 400 K/uL - - 94(L)     Discharge Instructions: Discharge  Instructions    Diet - low sodium heart healthy   Complete by:  As directed    Discharge instructions   Complete by:  As directed    Please see discharge summary.   Increase activity slowly   Complete by:  As directed       Signed: [name], MD 05/15/2018, 9:43 AM   Pager: [phone]

## 2018-05-09 NOTE — ED Notes (Signed)
Ortho PA at bedside. Stated patient able to have water.

## 2018-05-09 NOTE — ED Provider Notes (Signed)
[facility] EMERGENCY DEPARTMENT Provider Note   CSN: [id number] Arrival date & time: 05/09/18  0749    History   Chief Complaint Chief Complaint  Patient presents with   Fall    HPI Kathleen Arias is a 85 y.o. female.     HPI   Kathleen Arias is a 85 y.o. female, with a history of anemia, CHF, HTN, hypothyroidism, left hip replacement, B-cell lymphoma, splenomegaly, presenting to the ED with left hip pain following a fall that occurred this morning.  Patient states she was walking around her home, using furniture for stabilization as she normally would, her hand slipped from a piece of furniture, and she fell onto her left hip. Her pain is sharp, moderate, present in the left hip and thigh, nonradiating. Her left hip replacement was performed several years ago in [place].  Denies anticoagulation. Denies head injury, neck/back pain, chest pain, shortness of breath, abdominal pain, numbness, weakness, or any other complaints.    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   Counseling regarding advanced care planning and goals of care 05/21/2016   Iron deficiency anemia 05/16/2016   Anemia, iron deficiency 01/07/2016   Abnormal stress test    Chest pain 01/05/2016   Shortness of breath    Localized swelling of lower extremity    Hyponatremia    Hyperglycemia     Musculoskeletal pain     Past Surgical History:  Procedure Laterality Date   CARDIAC CATHETERIZATION N/A 01/07/2016   Procedure: Left Heart Cath and Coronary Angiography;  Surgeon: Jettie Booze, MD;  Location: [facility];  Service: Cardiovascular;  Laterality: N/A;   CATARACT EXTRACTION, BILATERAL     FRACTURE SURGERY     HIP SURGERY     JOINT REPLACEMENT     TONSILLECTOMY       OB History   No obstetric history on file.      Home Medications    Prior to Admission medications   Medication Sig Start Date End Date Taking? Authorizing Provider  acetaminophen (TYLENOL) 500 MG tablet Take 500 mg by mouth every 4 (four) hours as needed for mild pain or moderate pain.   Yes [provider]  B Complex-C-Calcium (GNP B-COMPLEX PLUS VITAMIN C PO) Give one by mouth daily for supplement   Yes [provider]  carvedilol (COREG) 6.25 MG tablet Take 0.5 tablets (3.125 mg total) by mouth 2 (two) times daily with a meal. Patient taking differently: Take 6.25 mg by mouth daily.  03/30/18  Yes Oscar La, Arlo C, PA-C  levothyroxine (SYNTHROID, LEVOTHROID) 150 MCG tablet Take 1 tablet (150 mcg total) by mouth daily before breakfast. For Hypothyroidism Patient taking differently: Take 150 mcg by mouth daily before breakfast.  03/30/18  Yes Lassen, Arlo C, PA-C  losartan (COZAAR) 25 MG tablet TAKE 1 TABLET(25  MG) BY MOUTH DAILY Patient taking differently: Take 25 mg by mouth daily.  05/08/18  Yes [name], MD  Multiple Vitamins-Minerals (PRESERVISION AREDS 2) CAPS Give one by mouth daily for supplement   Yes [provider]  torsemide (DEMADEX) 10 MG tablet Take 10 mg by mouth daily as needed (fluid).   Yes [provider]  iron polysaccharides (NIFEREX) 150 MG capsule Take 1 capsule (150 mg total) by mouth daily. Patient not taking: Reported on 05/09/2018 03/30/18   Wille Celeste, PA-C  Iron-Vitamins (GERITOL) LIQD Take 10 mLs by mouth  daily. Patient not taking: Reported on 05/09/2018 03/30/18   Wille Celeste, PA-C    Family History Family History  Problem Relation Age of Onset   Heart disease Mother     Social History Social History   Tobacco Use   Smoking status: Former Smoker   Smokeless tobacco: Never Used  Substance Use Topics   Alcohol use: No   Drug use: No     Allergies   Patient has no known allergies.   Review of Systems Review of Systems  Constitutional: Negative for diaphoresis and fever.  Respiratory: Negative for shortness of breath.   Cardiovascular: Negative for chest pain.  Gastrointestinal: Negative for abdominal pain, nausea and vomiting.  Musculoskeletal: Positive for arthralgias. Negative for back pain and neck pain.  Neurological: Negative for weakness and numbness.  All other systems reviewed and are negative.    Physical Exam Updated Vital Signs BP 133/62 (BP Location: Right Arm)    Pulse 87    Temp 98.3 F (36.8 C) (Oral)    SpO2 94%   Physical Exam Vitals signs and nursing note reviewed.  Constitutional:      General: She is not in acute distress.    Appearance: She is well-developed. She is not diaphoretic.  HENT:     Head: Normocephalic and atraumatic.     Mouth/Throat:     Mouth: Mucous membranes are moist.     Pharynx: Oropharynx is clear.  Eyes:     Extraocular Movements: Extraocular movements intact.     Conjunctiva/sclera: Conjunctivae normal.     Pupils: Pupils are equal, round, and reactive to light.  Neck:     Musculoskeletal: Normal range of motion and neck supple. No spinous process tenderness or muscular tenderness.     Comments: Patient presented in a c-collar placed by EMS. She has no complaints of neck pain. No midline cervical tenderness. No neuro deficits. Can rotate her head side to side greater than 45 degrees in each direction without pain or noted difficulty.  Cardiovascular:     Rate and Rhythm: Normal rate and regular rhythm.      Pulses:          Radial pulses are 2+ on the right side and 2+ on the left side.       Dorsalis pedis pulses are 2+ on the right side and 2+ on the left side.       Posterior tibial pulses are 2+ on the right side and 2+ on the left side.     Heart sounds: Normal heart sounds.     Comments: Tactile temperature in the extremities appropriate and equal bilaterally. Patient states her lower extremity edema is baseline for her. Pulmonary:     Effort: Pulmonary effort is normal. No respiratory distress.     Breath sounds: Normal breath sounds.  Abdominal:     Palpations: Abdomen is soft. There is splenomegaly (patient  states this is a chronic finding).     Tenderness: There is no abdominal tenderness. There is no guarding.     Comments: Patient states her splenomegaly is chronic for her.  Chart review reveals the same.  Musculoskeletal:     Left hip: She exhibits tenderness. She exhibits no deformity.     Right lower leg: Pitting Edema present.     Left lower leg: Pitting Edema present.     Comments: Tenderness to left anterior and lateral hip and distally to about mid femur.  No swelling, deformity, crepitus, shortening, or instability noted. No tenderness, swelling, instability, or deformity to the left knee, lower leg, or ankle.  Normal motor function intact in all other extremities. No midline spinal tenderness.   Skin:    General: Skin is warm and dry.  Neurological:     Mental Status: She is alert and oriented to person, place, and time.     Comments: Sensation to light touch grossly intact in the bilateral lower extremities. Grip strengths equal. Motor function intact in the bilateral lower extremities.  Strength 5/5 with plantar and dorsiflexion at the ankles bilaterally.  Psychiatric:        Mood and Affect: Mood and affect normal.        Speech: Speech normal.        Behavior: Behavior normal.      ED Treatments / Results  Labs (all labs ordered are listed, but only  abnormal results are displayed) Labs Reviewed  BASIC METABOLIC PANEL - Abnormal; Notable for the following components:      Result Value   Glucose, Bld 102 (*)    Calcium 8.1 (*)    All other components within normal limits  CBC WITH DIFFERENTIAL/PLATELET - Abnormal; Notable for the following components:   RBC 2.53 (*)    Hemoglobin 7.3 (*)    HCT 23.2 (*)    RDW 16.1 (*)    Platelets 90 (*)    Monocytes Absolute 2.4 (*)    All other components within normal limits  PROTIME-INR  PATHOLOGIST SMEAR REVIEW  TYPE AND SCREEN    EKG EKG Interpretation  Date/Time:  Wednesday May 09 2018 09:04:02 EDT Ventricular Rate:  89 PR Interval:    QRS Duration: 149 QT Interval:  448 QTC Calculation: 546 R Axis:   -82 Text Interpretation:  Sinus rhythm Left bundle branch block No significant change since last tracing Confirmed by Isla Pence [phone]) on 05/09/2018 9:06:18 AM Also confirmed by Isla Pence [phone]), editor Philomena Doheny [phone])  on 05/09/2018 10:14:30 AM   Radiology Ct Hip Left Wo Contrast  Result Date: 05/09/2018 CLINICAL DATA:  The patient suffered a periprosthetic left hip fracture in a fall today. Initial encounter. EXAM: CT OF THE LEFT HIP WITHOUT CONTRAST TECHNIQUE: Multidetector CT imaging of the left hip was performed according to the standard protocol. Multiplanar CT image reconstructions were also generated. COMPARISON:  Plain films left hip earlier today. FINDINGS: Bones/Joint/Cartilage Left hip arthroplasty is in place. The device is located. The patient has an acute fracture of the lateral aspect of the proximal femur. The fracture extends from the top of the greater trochanter 7.5 cm inferiorly. The fracture fragment is divided into 2 pieces and demonstrates lateral displacement of approximately 1 cm and slight superior displacement. No other fracture seen. Ligaments Suboptimally assessed by CT. Muscles and Tendons Visualization is limited due to streak artifact.   Appear intact. Soft tissues Hematoma about the patient's fracture is noted. Imaged intrapelvic  contents demonstrate some free pelvic fluid. Diverticulosis is noted. IMPRESSION: Mildly displaced periprosthetic fracture of the left femur extends approximately 7.5 cm craniocaudal from the top of the greater trochanter as described above. No other fracture is identified. Small volume of free pelvic fluid. Diverticulosis. Electronically Signed   By: Inge Rise M.D.   On: 05/09/2018 11:09   Dg Chest Portable 1 View  Result Date: 05/09/2018 CLINICAL DATA:  Pain following trauma. Preoperative for proximal femur fracture EXAM: PORTABLE CHEST 1 VIEW COMPARISON:  March 04, 2018 FINDINGS: There is no appreciable edema or consolidation. There is cardiomegaly with pulmonary vascularity normal. No adenopathy. There is aortic atherosclerosis. Bones are osteoporotic. No pneumothorax. No acute fracture evident. IMPRESSION: No edema or consolidation. Stable cardiomegaly. Bones osteoporotic. No pneumothorax. Aortic Atherosclerosis (ICD10-I70.0). Electronically Signed   By: Lowella Grip III M.D.   On: 05/09/2018 09:29   Dg Knee Complete 4 Views Left  Result Date: 05/09/2018 CLINICAL DATA:  Left knee pain after a fall today. Initial encounter. EXAM: LEFT KNEE - COMPLETE 4+ VIEW COMPARISON:  None. FINDINGS: There is no acute bony or joint abnormality. Mild depression of the medial tibial plateau is most consistent with remote fracture. Degenerative change is present about the knee and worst in the medial compartment. No joint effusion. No focal bony lesion. IMPRESSION: No acute abnormality. Remote mildly depressed medial tibial plateau fracture. 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 Electronically Signed   By: Inge Rise M.D.   On: 05/09/2018 10:42   Dg Hip Unilat W Or Wo Pelvis 2-3 Views Left  Result Date: 05/09/2018 CLINICAL DATA:  Left hip pain after fall today. EXAM: DG HIP (WITH OR WITHOUT  PELVIS) 2-3V LEFT COMPARISON:  CT scan of May 19, 2017. FINDINGS: Status post left hip arthroplasty. No dislocation is noted. However, there is interval development of moderately displaced and probably comminuted periprosthetic fracture involving the lateral portion of the intertrochanteric region of the proximal left femur. IMPRESSION: Moderately displaced and probably comminuted periprosthetic fracture is seen involving the lateral intertrochanteric region of the proximal left femur. Electronically Signed   By: Marijo Conception M.D.   On: 05/09/2018 08:50   Dg Femur Min 2 Views Left  Result Date: 05/09/2018 CLINICAL DATA:  Left hip pain after fall today. EXAM: LEFT FEMUR 2 VIEWS COMPARISON:  CT scan of May 19, 2017. FINDINGS: Status post left hip arthroplasty. There is interval development of moderately displaced and comminuted periprosthetic fracture involving lateral portion of intertrochanteric region of proximal left femur. The middle and distal portions of the femur are unremarkable. Vascular calcifications are noted. IMPRESSION: Moderately displaced and comminuted periprosthetic fracture involving intertrochanteric region of proximal left femur. Electronically Signed   By: Marijo Conception M.D.   On: 05/09/2018 08:52    Procedures Procedures (including critical care time)  Medications Ordered in ED Medications  fentaNYL (SUBLIMAZE) injection 50 mcg (50 mcg Intravenous Given 05/09/18 0923)  acetaminophen (TYLENOL) tablet 650 mg (650 mg Oral Given 05/09/18 0847)     Initial Impression / Assessment and Plan / ED Course  I have reviewed the triage vital signs and the nursing notes.  Pertinent labs & imaging results that were available during my care of the patient were reviewed by me and considered in my medical decision making (see chart for details).  Clinical Course as of May 09 1130  Wed May 09, 2018  0858 Spoke with Silvestre Gunner, PA on-call for orthopedic service.  States he will come  evaluate the  patient.   [SJ]  Tehama advises to admit to medicine. They will obtain a CT to assure stability. Patient may be able to be managed nonsurgically.   [SJ]  1013 Previously 8.5 about a month ago.  Hemoglobin drop as expected given the patient's injury.  She shows no signs of hemodynamic instability at this time.  We will continue to monitor.  Hemoglobin(!): 7.3 [SJ]  1023 Spoke with Dr. Shan Levans, IM teaching service. Agrees to see and admit the patient.   [SJ]    Clinical Course User Index [SJ] Korayma Hagwood C, PA-C       Patient presents with left hip pain following a mechanical fall.  No evidence of neurovascular compromise in the extremity.  Evidence of periprosthetic intertrochanteric fracture noted on x-ray.  Evaluated by orthopedic service and admitted by internal medicine teaching service.   Labs and radiological studies were personally reviewed by me.  Findings and plan of care discussed with Isla Pence, MD. Dr. Gilford Raid personally evaluated and examined this patient.  Vitals:   05/09/18 0815 05/09/18 0900 05/09/18 0905 05/09/18 1000  BP: (!) 114/55  (!) 113/56   Pulse: 80 77 78 69  Resp: (!) 22 (!) 25 20 (!) 22  Temp:      TempSrc:      SpO2: 95% 94% 95% 95%     Final Clinical Impressions(s) / ED Diagnoses   Final diagnoses:  Periprosthetic intertrochanteric fracture of femur, initial encounter    ED Discharge Orders    None       Layla Maw 05/09/18 1132    Isla Pence, MD 05/09/18 1138

## 2018-05-09 NOTE — Progress Notes (Signed)
Per Lovena Le, RN 500cc NS bolus not admin in ED. Will admin and continue to monitor.

## 2018-05-09 NOTE — Plan of Care (Signed)
  Problem: Education: Goal: Knowledge of General Education information will improve Description: Including pain rating scale, medication(s)/side effects and non-pharmacologic comfort measures Outcome: Progressing   Problem: Health Behavior/Discharge Planning: Goal: Ability to manage health-related needs will improve Outcome: Progressing   Problem: Clinical Measurements: Goal: Ability to maintain clinical measurements within normal limits will improve Outcome: Progressing Goal: Will remain free from infection Outcome: Progressing Goal: Respiratory complications will improve Outcome: Progressing Goal: Cardiovascular complication will be avoided Outcome: Progressing   Problem: Nutrition: Goal: Adequate nutrition will be maintained Outcome: Progressing   Problem: Coping: Goal: Level of anxiety will decrease Outcome: Progressing   Problem: Pain Managment: Goal: General experience of comfort will improve Outcome: Progressing   Problem: Safety: Goal: Ability to remain free from injury will improve Outcome: Progressing   Problem: Skin Integrity: Goal: Risk for impaired skin integrity will decrease Outcome: Progressing   

## 2018-05-09 NOTE — Consult Note (Signed)
Reason for Consult:Left hip fx Referring Physician: Arielys Arias is an 85 y.o. female.  HPI: Kathleen Arias was at home in the bathroom and slipped and fell. She had immediate left hip pain and was unable to get up. She came to the ED and x-rays showed a left periprosthetic hip fx. She had her hip replaced in [place] years ago. She lives at home alone but her children take turns staying with her.  Past Medical History:  Diagnosis Date  . Anemia   . Anginal pain ([place])   . Arthritis   . Cancer ([place])   . CHF (congestive heart failure) ([place])   . Dyspnea   . Dysrhythmia   . Hypertension   . Hypothyroidism   . Thyroid disease     Past Surgical History:  Procedure Laterality Date  . CARDIAC CATHETERIZATION N/A 01/07/2016   Procedure: Left Heart Cath and Coronary Angiography;  Surgeon: Jettie Booze, MD;  Location: [facility];  Service: Cardiovascular;  Laterality: N/A;  . CATARACT EXTRACTION, BILATERAL    . FRACTURE SURGERY    . HIP SURGERY    . JOINT REPLACEMENT    . TONSILLECTOMY      Family History  Problem Relation Age of Onset  . Heart disease Mother     Social History:  reports that she has quit smoking. She has never used smokeless tobacco. She reports that she does not drink alcohol or use drugs.  Allergies: No Known Allergies  Medications: I have reviewed the patient's current medications.  Results for orders placed or performed during the hospital encounter of 05/09/18 (from the past 48 hour(s))  Basic metabolic panel     Status: Abnormal   Collection Time: 05/09/18  9:00 AM  Result Value Ref Range   Sodium 135 135 - 145 mmol/L   Potassium 4.3 3.5 - 5.1 mmol/L   Chloride 99 98 - 111 mmol/L   CO2 26 22 - 32 mmol/L   Glucose, Bld 102 (H) 70 - 99 mg/dL   BUN 17 8 - 23 mg/dL   Creatinine, Ser 0.76 0.44 - 1.00 mg/dL   Calcium 8.1 (L) 8.9 - 10.3 mg/dL   GFR calc non Af Amer >60 >60 mL/min   GFR calc Af Amer >60 >60 mL/min   Anion gap 10 5 - 15   Comment: Performed at [facility], [place] [address] [place], [place] [address]  CBC WITH DIFFERENTIAL     Status: Abnormal   Collection Time: 05/09/18  9:00 AM  Result Value Ref Range   WBC 6.9 4.0 - 10.5 K/uL   RBC 2.53 (L) 3.87 - 5.11 MIL/uL   Hemoglobin 7.3 (L) 12.0 - 15.0 g/dL   HCT 23.2 (L) 36.0 - 46.0 %   MCV 91.7 80.0 - 100.0 fL   MCH 28.9 26.0 - 34.0 pg   MCHC 31.5 30.0 - 36.0 g/dL   RDW 16.1 (H) 11.5 - 15.5 %   Platelets 90 (L) 150 - 400 K/uL    Comment: REPEATED TO VERIFY PLATELET COUNT CONFIRMED BY SMEAR Immature Platelet Fraction may be clinically indicated, consider ordering this additional test [id number]    nRBC 0.0 0.0 - 0.2 %   Neutrophils Relative % 27 %   Neutro Abs 1.9 1.7 - 7.7 K/uL   Lymphocytes Relative 38 %   Lymphs Abs 2.6 0.7 - 4.0 K/uL   Monocytes Relative 35 %   Monocytes Absolute 2.4 (H) 0.1 - 1.0 K/uL  Eosinophils Relative 0 %   Eosinophils Absolute 0.0 0.0 - 0.5 K/uL   Basophils Relative 0 %   Basophils Absolute 0.0 0.0 - 0.1 K/uL   Immature Granulocytes 0 %   Abs Immature Granulocytes 0.02 0.00 - 0.07 K/uL    Comment: Performed at [facility], [place] [address] [place], [place] [address]  Protime-INR     Status: None   Collection Time: 05/09/18  9:00 AM  Result Value Ref Range   Prothrombin Time 13.8 11.4 - 15.2 seconds   INR 1.1 0.8 - 1.2    Comment: (NOTE) INR goal varies based on device and disease states. Performed at [facility], [place] [address], [place], [place] [address]   Type and screen [place]     Status: None   Collection Time: 05/09/18  9:08 AM  Result Value Ref Range   ABO/RH(D) O NEG    Antibody Screen NEG    Sample Expiration      05/12/2018 Performed at [facility], [place] [address] [place], [place] [address]     Dg Chest Portable 1 View  Result Date: 05/09/2018 CLINICAL DATA:  Pain following trauma. Preoperative for proximal femur fracture EXAM: PORTABLE CHEST 1 VIEW  COMPARISON:  March 04, 2018 FINDINGS: There is no appreciable edema or consolidation. There is cardiomegaly with pulmonary vascularity normal. No adenopathy. There is aortic atherosclerosis. Bones are osteoporotic. No pneumothorax. No acute fracture evident. IMPRESSION: No edema or consolidation. Stable cardiomegaly. Bones osteoporotic. No pneumothorax. Aortic Atherosclerosis (ICD10-I70.0). Electronically Signed   By: Lowella Grip III M.D.   On: 05/09/2018 09:29   Dg Hip Unilat W Or Wo Pelvis 2-3 Views Left  Result Date: 05/09/2018 CLINICAL DATA:  Left hip pain after fall today. EXAM: DG HIP (WITH OR WITHOUT PELVIS) 2-3V LEFT COMPARISON:  CT scan of May 19, 2017. FINDINGS: Status post left hip arthroplasty. No dislocation is noted. However, there is interval development of moderately displaced and probably comminuted periprosthetic fracture involving the lateral portion of the intertrochanteric region of the proximal left femur. IMPRESSION: Moderately displaced and probably comminuted periprosthetic fracture is seen involving the lateral intertrochanteric region of the proximal left femur. Electronically Signed   By: Marijo Conception M.D.   On: 05/09/2018 08:50   Dg Femur Min 2 Views Left  Result Date: 05/09/2018 CLINICAL DATA:  Left hip pain after fall today. EXAM: LEFT FEMUR 2 VIEWS COMPARISON:  CT scan of May 19, 2017. FINDINGS: Status post left hip arthroplasty. There is interval development of moderately displaced and comminuted periprosthetic fracture involving lateral portion of intertrochanteric region of proximal left femur. The middle and distal portions of the femur are unremarkable. Vascular calcifications are noted. IMPRESSION: Moderately displaced and comminuted periprosthetic fracture involving intertrochanteric region of proximal left femur. Electronically Signed   By: Marijo Conception M.D.   On: 05/09/2018 08:52    Review of Systems  Constitutional: Negative for weight loss.  HENT:  Negative for ear discharge, ear pain, hearing loss and tinnitus.   Eyes: Negative for blurred vision, double vision, photophobia and pain.  Respiratory: Negative for cough, sputum production and shortness of breath.   Cardiovascular: Negative for chest pain.  Gastrointestinal: Negative for abdominal pain, nausea and vomiting.  Genitourinary: Negative for dysuria, flank pain, frequency and urgency.  Musculoskeletal: Positive for joint pain (Left hip). Negative for back pain, falls, myalgias and neck pain.  Neurological: Negative for dizziness, tingling, sensory change, focal weakness,  loss of consciousness and headaches.  Endo/Heme/Allergies: Does not bruise/bleed easily.  Psychiatric/Behavioral: Negative for depression, memory loss and substance abuse. The patient is not nervous/anxious.    Blood pressure (!) 113/56, pulse 69, temperature 98.3 F (36.8 C), temperature source Oral, resp. rate (!) 22, SpO2 95 %. Physical Exam  Constitutional: She appears well-developed and well-nourished. No distress.  HENT:  Head: Normocephalic and atraumatic.  Eyes: Conjunctivae are normal. Right eye exhibits no discharge. Left eye exhibits no discharge. No scleral icterus.  Neck: Normal range of motion.  Cardiovascular: Normal rate and regular rhythm.  Respiratory: Effort normal. No respiratory distress.  Musculoskeletal:     Comments: LLE No traumatic wounds, ecchymosis, or rash  TTP hip and knee  No knee or ankle effusion  Knee stable to varus/ valgus and anterior/posterior stress but painful  Sens DPN, SPN, TN intact  Motor EHL, ext, flex, evers 5/5  DP 1+, PT 1+, No significant edema  Neurological: She is alert.  Skin: Skin is warm and dry. She is not diaphoretic.  Psychiatric: She has a normal mood and affect. Her behavior is normal.    Assessment/Plan: Left periprosthetic hip fx -- This appears to be a stable fx on x-ray. Will get a knee films and a CT of the hip to be sure. Would advocate  medical admission and TTWB LLE with a RW. I think we will be able to avoid surgery. Multiple medical problems including CHF, HTN, hypothyroidism, and lymphoma -- per primary service    Lisette Abu, PA-C Orthopedic Surgery [phone] 05/09/2018, 10:24 AM

## 2018-05-10 DIAGNOSIS — Z96649 Presence of unspecified artificial hip joint: Secondary | ICD-10-CM

## 2018-05-10 DIAGNOSIS — M978XXA Periprosthetic fracture around other internal prosthetic joint, initial encounter: Secondary | ICD-10-CM

## 2018-05-10 DIAGNOSIS — R339 Retention of urine, unspecified: Secondary | ICD-10-CM

## 2018-05-10 DIAGNOSIS — C8590 Non-Hodgkin lymphoma, unspecified, unspecified site: Secondary | ICD-10-CM

## 2018-05-10 DIAGNOSIS — Z9889 Other specified postprocedural states: Secondary | ICD-10-CM

## 2018-05-10 DIAGNOSIS — W19XXXA Unspecified fall, initial encounter: Secondary | ICD-10-CM

## 2018-05-10 LAB — PREPARE RBC (CROSSMATCH)

## 2018-05-10 LAB — CBC
HCT: 18.5 % — ABNORMAL LOW (ref 36.0–46.0)
Hemoglobin: 6 g/dL — CL (ref 12.0–15.0)
MCH: 29.4 pg (ref 26.0–34.0)
MCHC: 32.4 g/dL (ref 30.0–36.0)
MCV: 90.7 fL (ref 80.0–100.0)
Platelets: 90 10*3/uL — ABNORMAL LOW (ref 150–400)
RBC: 2.04 MIL/uL — ABNORMAL LOW (ref 3.87–5.11)
RDW: 16.2 % — ABNORMAL HIGH (ref 11.5–15.5)
WBC: 5.8 10*3/uL (ref 4.0–10.5)
nRBC: 0 % (ref 0.0–0.2)

## 2018-05-10 LAB — COMPREHENSIVE METABOLIC PANEL
ALT: 9 U/L (ref 0–44)
AST: 29 U/L (ref 15–41)
Albumin: 2.2 g/dL — ABNORMAL LOW (ref 3.5–5.0)
Alkaline Phosphatase: 69 U/L (ref 38–126)
Anion gap: 8 (ref 5–15)
BUN: 20 mg/dL (ref 8–23)
CO2: 27 mmol/L (ref 22–32)
Calcium: 7.9 mg/dL — ABNORMAL LOW (ref 8.9–10.3)
Chloride: 100 mmol/L (ref 98–111)
Creatinine, Ser: 0.85 mg/dL (ref 0.44–1.00)
GFR calc Af Amer: >60 mL/min (ref >60)
GFR calc non Af Amer: 60 mL/min — ABNORMAL LOW (ref >60)
Glucose, Bld: 96 mg/dL (ref 70–99)
Potassium: 4.1 mmol/L (ref 3.5–5.1)
Sodium: 135 mmol/L (ref 135–145)
Total Bilirubin: 0.8 mg/dL (ref 0.3–1.2)
Total Protein: 5.4 g/dL — ABNORMAL LOW (ref 6.5–8.1)

## 2018-05-10 LAB — HEMOGLOBIN AND HEMATOCRIT, BLOOD
HCT: 23.6 % — ABNORMAL LOW (ref 36.0–46.0)
Hemoglobin: 7.7 g/dL — ABNORMAL LOW (ref 12.0–15.0)

## 2018-05-10 LAB — TSH: TSH: 13.178 u[IU]/mL — ABNORMAL HIGH (ref 0.350–4.500)

## 2018-05-10 LAB — T4, FREE: Free T4: 1.02 ng/dL (ref 0.82–1.77)

## 2018-05-10 MED ORDER — SODIUM CHLORIDE 0.9% IV SOLUTION
Freq: Once | INTRAVENOUS | Status: AC
  Administered 2018-05-10: 08:00:00 via INTRAVENOUS

## 2018-05-10 NOTE — Progress Notes (Signed)
Patient complaining of abdominal pains with abdominal being distended. Bladder scan completed with >813 showing. I&O completed with only 15mL drained. Abdominal still distended with no relieve.

## 2018-05-10 NOTE — NC FL2 (Addendum)
[place] MEDICAID FL2 LEVEL OF CARE SCREENING TOOL     IDENTIFICATION  Patient Name: Kathleen Arias Birthdate: 1925-09-13 Sex: female Admission Date (Current Location): 05/09/2018  [facility] and [place] Number:  Herbalist and Address:  [facility], [place] [address] [place], [place] [address]      Provider Number: [id number]  Attending Physician Name and Address:  Lucious Groves, DO  Relative Name and Phone Number:   [name], daughter, [phone]    Current Level of Care: Hospital Recommended Level of Care: [place] Prior Approval Number:    Date Approved/Denied:   PASRR Number: [id number]  Discharge Plan: SNF    Current Diagnoses: Patient Active Problem List   Diagnosis Date Noted  . Peri-prosthetic intertrochanteric fracture of femur   . Closed left hip fracture ([place]) 05/09/2018  . Weakness 03/04/2018  . UTI (urinary tract infection) 03/03/2018  . NICM (nonischemic cardiomyopathy) ([place]) 03/02/2018  . Hypothyroidism 03/02/2018  . Generalized weakness 05/19/2017  . Edema of left lower extremity 02/21/2017  . Splenic marginal zone b-cell lymphoma ([place]) 06/20/2016  . Counseling regarding advanced care planning and goals of care 05/21/2016  . Iron deficiency anemia 05/16/2016  . Anemia, iron deficiency 01/07/2016  . Normocytic anemia 01/07/2016  . Abnormal stress test   . Chest pain 01/05/2016  . Shortness of breath   . Localized swelling of lower extremity   . Hyponatremia   . Hyperglycemia   . Musculoskeletal pain     Orientation RESPIRATION BLADDER Height & Weight     Self, Time, Situation, Place  Normal Continent, Indwelling catheter(foley for retention) Weight: 108 lb 3.9 oz (49.1 kg) Height:  5\' 3"  (160 cm)  BEHAVIORAL SYMPTOMS/MOOD NEUROLOGICAL BOWEL NUTRITION STATUS      Continent Diet(heart healthy; thin liquids)  AMBULATORY STATUS COMMUNICATION OF NEEDS Skin   Extensive Assist Verbally Other  (Comment), Skin abrasions(abrasion on left elbow with foam dressing; generalized eccymosis)                       Personal Care Assistance Level of Assistance  Bathing, Feeding, Dressing Bathing Assistance: Maximum assistance Feeding assistance: Independent Dressing Assistance: Maximum assistance     Functional Limitations Info  Sight, Hearing, Speech Sight Info: Adequate Hearing Info: Adequate Speech Info: Adequate    SPECIAL CARE FACTORS FREQUENCY  PT (By licensed PT), OT (By licensed OT)  PT Frequency: 5x week OT Frequency: 5x week     Contractures Contractures Info: Not present    Additional Factors Info  Code Status, Allergies Code Status Info: DNR Allergies Info: No Known Allergies           Current Medications (05/10/2018):  This is the current hospital active medication list Current Facility-Administered Medications  Medication Dose Route Frequency Provider Last Rate Last Dose  . acetaminophen (TYLENOL) tablet 650 mg  650 mg Oral Q6H Katherine Roan, MD   650 mg at 05/10/18 1423  . enoxaparin (LOVENOX) injection 40 mg  40 mg Subcutaneous Q24H Katherine Roan, MD   40 mg at 05/09/18 1741  . fentaNYL (SUBLIMAZE) injection 25 mcg  25 mcg Intravenous Q2H PRN Katherine Roan, MD   25 mcg at 05/10/18 1423  . levothyroxine (SYNTHROID) tablet 150 mcg  150 mcg Oral QAC breakfast Katherine Roan, MD   150 mcg at 05/10/18 8466  . pneumococcal 23 valent vaccine (PNU-IMMUNE) injection 0.5 mL  0.5 mL Intramuscular Tomorrow-1000 Lucious Groves,  DO      . torsemide (DEMADEX) tablet 10 mg  10 mg Oral Daily PRN Katherine Roan, MD         Discharge Medications: Please see discharge summary for a list of discharge medications.  Relevant Imaging Results:  Relevant Lab Results:   Additional Information SSN: [place], [place]

## 2018-05-10 NOTE — Progress Notes (Signed)
PT Cancellation Note  Patient Details Name: Kathleen Arias MRN: [id number] DOB: Oct 17, 1925   Cancelled Treatment:    Reason Eval/Treat Not Completed: Medical issues which prohibited therapy - Pt with Hgb of 6, receiving unit of blood currently. Will check back later.  Julien Girt, PT Acute Rehabilitation Services Pager [phone]  Office [phone]    Roxine Caddy D Elonda Husky 05/10/2018, 9:31 AM

## 2018-05-10 NOTE — Evaluation (Signed)
Physical Therapy Evaluation Patient Details Name: Kathleen Arias MRN: [id number] DOB: 1925-10-26 Today's Date: 05/10/2018   History of Present Illness  Pt is a 85 y.o. female who is admitted with a closed left hip fracture from mechanical fall. PMH significant for heart failure with reduced ejection fraction, non-Hodgkin's lymphoma followed by Dr. [name], previous hip fracture in 2006 and hypothyroidism.  Clinical Impression   Pt presents with severe L hip pain, requires total assist to perform bed mobility, requires min guard-max assist for sitting EOB, and cannot tolerate standing at this time due to fatigue and L hip pain. Pt to benefit from acute PT to address deficits. Pt sat EOB with PT/OT support for ~5 minutes, requesting to return to supine after 5 minutes due to pain and fatigue. Pt is very anxious about mobility with hip fracture, and cried out during a majority of mobility today. PT recommending SNF placement to address mobility deficits and return to PLOF. PT to progress mobility as tolerated, and will continue to follow acutely.      Follow Up Recommendations SNF;Supervision/Assistance - 24 hour    Equipment Recommendations  None recommended by PT    Recommendations for Other Services       Precautions / Restrictions Precautions Precautions: Fall Restrictions Weight Bearing Restrictions: Yes LLE Weight Bearing: (toetouch WB)      Mobility  Bed Mobility Overal bed mobility: Needs Assistance Bed Mobility: Supine to Sit;Sit to Supine     Supine to sit: +2 for physical assistance;Total assist Sit to supine: +2 for physical assistance;Total assist   General bed mobility comments: Helicopter transfer to and from EOB with use of bed pads, as pt with difficulty in participating in mobility due to pain and fatigue. PT assisted in scooting pt to EOB with use of bed pads while OT supported pt sitting balance. Pt crying out in pain during mobility, but once sitting EOB with RLE on  ground, pt calmed down.   Transfers Overall transfer level: (not attempted today due to pain and fatigue)                  Ambulation/Gait                Stairs            Wheelchair Mobility    Modified Rankin (Stroke Patients Only)       Balance Overall balance assessment: Needs assistance Sitting-balance support: Single extremity supported Sitting balance-Leahy Scale: Poor Sitting balance - Comments: Sat EOB for approximately 5 minutes with inital max assist fade to min guard with heavy posterior lean throughout. PT encouraged UE propping.  Postural control: Posterior lean Standing balance support: (NT - pt too fatigued and in too much pain)                                 Pertinent Vitals/Pain Pain Assessment: 0-10 Pain Score: 6  Pain Location: left hip Pain Descriptors / Indicators: Guarding;Moaning;Grimacing;Sharp Pain Intervention(s): Monitored during session;Repositioned;Patient requesting pain meds-RN notified;Limited activity within patient's tolerance    Home Living Family/patient expects to be discharged to:: Private residence Living Arrangements: Alone Available Help at Discharge: Family;Available PRN/intermittently Type of Home: House Home Access: Stairs to enter Entrance Stairs-Rails: Left Entrance Stairs-Number of Steps: 4 Home Layout: One level Home Equipment: Walker - 2 wheels      Prior Function Level of Independence: Independent with assistive device(s)  Comments: ambulates with RW, pt with history of multiple falls in the past year. Per chart review, pt has 4 children who alternate staying overnight with her at her home.      Hand Dominance   Dominant Hand: Right    Extremity/Trunk Assessment   Upper Extremity Assessment Upper Extremity Assessment: Defer to OT evaluation    Lower Extremity Assessment Lower Extremity Assessment: Generalized weakness;LLE deficits/detail LLE Deficits / Details:  Pt able to perform quad set, ankle pump, limited ROM heel slide. Pt very fearful of movement, extremely TTP over L hip.  LLE: Unable to fully assess due to pain    Cervical / Trunk Assessment Cervical / Trunk Assessment: Normal  Communication   Communication: No difficulties  Cognition Arousal/Alertness: Awake/alert Behavior During Therapy: WFL for tasks assessed/performed;Anxious Overall Cognitive Status: Within Functional Limits for tasks assessed                                 General Comments: Pt nervous about mobility, slightly panicked and crying out in pain with bed mobility.       General Comments      Exercises     Assessment/Plan    PT Assessment Patient needs continued PT services  PT Problem List Decreased strength;Decreased mobility;Decreased range of motion;Decreased knowledge of precautions;Decreased activity tolerance;Decreased balance;Decreased knowledge of use of DME;Pain       PT Treatment Interventions DME instruction;Functional mobility training;Balance training;Patient/family education;Therapeutic activities;Gait training;Therapeutic exercise    PT Goals (Current goals can be found in the Care Plan section)  Acute Rehab PT Goals Patient Stated Goal: to decrease pain PT Goal Formulation: With patient Time For Goal Achievement: 05/10/18 Potential to Achieve Goals: Good    Frequency Min 3X/week   Barriers to discharge        Co-evaluation PT/OT/SLP Co-Evaluation/Treatment: Yes Reason for Co-Treatment: For patient/therapist safety PT goals addressed during session: Mobility/safety with mobility;Balance OT goals addressed during session: ADL's and self-care;Strengthening/ROM       AM-PAC PT "6 Clicks" Mobility  Outcome Measure Help needed turning from your back to your side while in a flat bed without using bedrails?: Total Help needed moving from lying on your back to sitting on the side of a flat bed without using bedrails?:  Total Help needed moving to and from a bed to a chair (including a wheelchair)?: Total Help needed standing up from a chair using your arms (e.g., wheelchair or bedside chair)?: Total Help needed to walk in hospital room?: Total Help needed climbing 3-5 steps with a railing? : Total 6 Click Score: 6    End of Session   Activity Tolerance: Patient limited by fatigue;Patient limited by pain Patient left: in bed;with bed alarm set;with call bell/phone within reach Nurse Communication: Mobility status;Patient requests pain meds PT Visit Diagnosis: Other abnormalities of gait and mobility (R26.89);Muscle weakness (generalized) (M62.81);Repeated falls (R29.6);Pain Pain - Right/Left: Left Pain - part of body: Leg    Time: 1355-1410 PT Time Calculation (min) (ACUTE ONLY): 15 min   Charges:   PT Evaluation $PT Eval Low Complexity: 1 Low        Julien Girt, PT Acute Rehabilitation Services Pager [phone]  Office [phone]   Kathleen Arias D Elonda Husky 05/10/2018, 4:02 PM

## 2018-05-10 NOTE — Progress Notes (Signed)
Foley catheter inserted for urinary retention this morning. Unit of prbcs also transfused for hemoglobin of 6.0

## 2018-05-10 NOTE — Progress Notes (Signed)
   Subjective: Kathleen Arias states that she is doing similar to yesterday. She believes that her pain has however improved with current pain medication regiment. She is having abdominal discomfort and has not urinated much overnight. She does not have any other acute complaints.   Objective:  Vital signs in last 24 hours: Vitals:   05/09/18 1930 05/10/18 0358 05/10/18 0830 05/10/18 0845  BP: (!) 103/44 (!) 121/53 (!) 103/46 (!) 104/44  Pulse: 74 82 76 71  Resp: 14 18 16 16   Temp: 97.7 F (36.5 C) 97.6 F (36.4 C) (!) 97.5 F (36.4 C) 97.6 F (36.4 C)  TempSrc: Oral Oral Oral Oral  SpO2: 95% 97% 95% 94%  Weight:      Height:       Neuro: Lying in bed in no acute distress Cardiovascular: Normal rate, regular rhythm Respiratory: Clear to auscultation bilaterally, normal work of breathing Abdominal: Normoactive bowel sounds, nontender to palpation MSK: Tenderness to palpation of both extremities bilaterally, no bruising or discoloration noted at the tip. Psych: Normal affect, behavior, mood  Assessment/Plan:  Active Problems:   Closed left hip fracture ([place])  Kathleen Arias is a 85 year old female who is admitted with a closed left hip fracture from mechanical fall.  Closed left hip fracture: - Appreciate orthopedic surgery recommendations which include nonoperative management.  We will continue pain control with scheduled Tylenol and as needed fentanyl.  Plan is for her to follow-up with Dr. Debroah Loop (orthopedist) office in 1 to 2 weeks for repeat x-rays. - Follow-up PT/OT eval and treat, she will likely need SNF placement. Social work consulted for SNF placement  Normocytic anemia/non-Hodgkin's lymphoma: Hemoglobin decreased today to 6.  Likely due to a combination of iron deficiency anemia and acute blood loss anemia from trauma. - Will give 1 unit packed red blood cells today. - Follow-up posttransfusion H&H  Acute urinary retention: She was noted to have 100 cc of urine last  night and was only able to get out 100 cc of urine with in and out cath.  Does appear to be retaining urine.  This is likely due to opioid use. - Place Foley catheter  Hypothyroidism: TSH elevated to 13 but free T4 within normal limits.  I do believe that she is being treated appropriately but the TSH has not returned to normal because there has not been enough time for it to normalize since adjusting levothyroxine dose. - Continue home levothyroxine  Heart failure with reduced ejection fraction: She does appear to be euvolemic on exam.  We will continue to monitor fluid status while getting blood transfusion. -Continue home torsemide  Hypertension: Blood pressure within normal limits.  We will continue holding home blood pressure meds.  Dispo: Anticipated discharge in approximately 1 to 2 days pending PT/OT eval.  Kathleen Sage, MD 05/10/2018, 10:14 AM Pager: [phone]

## 2018-05-10 NOTE — TOC Initial Note (Signed)
Transition of Care [facility]) - Initial/Assessment Note    Patient Details  Name: Kathleen Arias MRN: [id number] Date of Birth: 03/18/1925  Transition of Care [facility]) CM/SW Contact:    Alexander [place], [place] Phone Number: 05/10/2018, 4:20 PM  Clinical Narrative:                 CSW spoke with pt at bedside, pt turned 85 years old today. Introduced self, role, reason for visit. Pt from home alone, has supportive children. She recently had been at [facility] for rehab. CSW inquired about when she discharged back home but pt could not fully remember. Pt is interested in rehab and gave verbal permission to initiate bed search and call daughter to update her.   CSW will follow, pt will need auth through [facility].   Expected Discharge Plan: Skilled Nursing Facility Barriers to Discharge: Continued Medical Work up, Ship broker   Patient Goals and CMS Choice Patient states their goals for this hospitalization and ongoing recovery are:: to feel better CMS Medicare.gov Compare Post Acute Care list provided to:: Patient Represenative (must comment) Choice offered to / list presented to : Patient, Adult Children  Expected Discharge Plan and Services Expected Discharge Plan: [place] In-house Referral: Clinical Social Work Discharge Planning Services: NA Post Acute Care Choice: [place] Living arrangements for the past 2 months: Single Family Home                Prior Living Arrangements/Services Living arrangements for the past 2 months: Single Family Home Lives with:: Self Patient language and need for interpreter reviewed:: Yes Do you feel safe going back to the place where you live?: Yes      Need for Family Participation in Patient Care: Yes (Comment) Care giver support system in place?: Yes (comment) Current home services: DME Criminal Activity/Legal Involvement Pertinent to Current Situation/Hospitalization: No - Comment as needed  Activities  of Daily Living Home Assistive Devices/Equipment: Bedside commode/3-in-1, Walker (specify type) ADL Screening (condition at time of admission) Patient's cognitive ability adequate to safely complete daily activities?: Yes Is the patient deaf or have difficulty hearing?: No Does the patient have difficulty seeing, even when wearing glasses/contacts?: No Does the patient have difficulty concentrating, remembering, or making decisions?: No Patient able to express need for assistance with ADLs?: Yes Does the patient have difficulty dressing or bathing?: No Independently performs ADLs?: Yes (appropriate for developmental age) Does the patient have difficulty walking or climbing stairs?: Yes Weakness of Legs: Left Weakness of Arms/Hands: None  Permission Sought/Granted Permission sought to share information with : Facility Sport and exercise psychologist, Family Supports Permission granted to share information with : Yes, Verbal Permission Granted  Share Information with NAME: [name]; [place] granted to share info w AGENCY: SNFs  Permission granted to share info w Relationship: daughters  Permission granted to share info w Contact Information: [phone]  Emotional Assessment Appearance:: Appears stated age Attitude/Demeanor/Rapport: Engaged, Gracious Affect (typically observed): Accepting, Adaptable, Appropriate, Pleasant Orientation: : Oriented to Self, Oriented to Place, Oriented to Situation, Oriented to  Time Alcohol / Substance Use: Not Applicable Psych Involvement: No (comment)  Admission diagnosis:  Knee pain, left [M25.562] Periprosthetic intertrochanteric fracture of femur, initial encounter [Q67.8XXA, [place] Patient Active Problem List   Diagnosis Date Noted  . Peri-prosthetic intertrochanteric fracture of femur   . Closed left hip fracture ([place]) 05/09/2018  . Weakness 03/04/2018  . UTI (urinary tract infection) 03/03/2018  . NICM (nonischemic  cardiomyopathy) ([place])  03/02/2018  . Hypothyroidism 03/02/2018  . Generalized weakness 05/19/2017  . Edema of left lower extremity 02/21/2017  . Splenic marginal zone b-cell lymphoma ([place]) 06/20/2016  . Counseling regarding advanced care planning and goals of care 05/21/2016  . Iron deficiency anemia 05/16/2016  . Anemia, iron deficiency 01/07/2016  . Normocytic anemia 01/07/2016  . Abnormal stress test   . Chest pain 01/05/2016  . Shortness of breath   . Localized swelling of lower extremity   . Hyponatremia   . Hyperglycemia   . Musculoskeletal pain    PCP:  [name], MD Pharmacy:   [facility] [place], [place] - [address] AT [address] [place] [address] Phone: [phone] Fax: [phone]     Social Determinants of Health (SDOH) Interventions    Readmission Risk Interventions No flowsheet data found.

## 2018-05-10 NOTE — Progress Notes (Signed)
Occupational Therapy Evaluation Patient Details Name: Kathleen Arias MRN: [id number] DOB: [birth year] Today's Date: 05/10/2018    History of Present Illness Pt is a 85 y.o. female who is admitted with a closed left hip fracture from mechanical fall. PMH significant for heart failure with reduced ejection fraction, non-Hodgkin's lymphoma followed by Dr. [name], previous hip fracture in 2006 and hypothyroidism.   Clinical Impression   Pt admitted with above diagnosis.  PTA, pt independent with BADLs using RW and receiving intermittent assist/support from family. Pt currently limited with functional mobility and ADLs due to left hip pain. Pt required +2 total assist for all aspects of bed mobility. She sat EOB for approximately 5 minutes with decreasing assist from max assist to min guard. She requires max-total assist for LB ADLs.  Did not attempt OOB mobility due to pain and pt refusing. Recommending SNF for discharge planning.  Will continue to follow acutely to maximize safety and independence with ADLs.    Follow Up Recommendations  SNF;Supervision/Assistance - 24 hour    Equipment Recommendations  Other (comment)(defer to next venue)    Recommendations for Other Services       Precautions / Restrictions Precautions Precautions: Fall Restrictions Weight Bearing Restrictions: Yes LLE Weight Bearing: Touchdown weight bearing      Mobility Bed Mobility Overal bed mobility: Needs Assistance Bed Mobility: Supine to Sit;Sit to Supine     Supine to sit: +2 for physical assistance;Total assist Sit to supine: +2 for physical assistance;Total assist   General bed mobility comments: Helicopter technique with use of bed pads under hip to elevate trunk and bring LEs OOB. Pt yelling with all movement.   Transfers Overall transfer level: (not attempted today due to pain and fatigue)                    Balance Overall balance assessment: Needs assistance Sitting-balance support:  Single extremity supported Sitting balance-Leahy Scale: Poor Sitting balance - Comments: Sat EOB for approximately 5 minutes with inital max assist fade to min guard with heavy posterior lean throughout.                                    ADL either performed or assessed with clinical judgement   ADL Overall ADL's : Needs assistance/impaired Eating/Feeding: Modified independent;Sitting;Bed level   Grooming: Supervision/safety;Sitting   Upper Body Bathing: Minimal assistance;Bed level   Lower Body Bathing: Maximal assistance;Bed level   Upper Body Dressing : Minimal assistance;Bed level   Lower Body Dressing: Bed level;Total assistance                 General ADL Comments: Pt sat EOB for approximately 5 minutes but declined further activity due to pain and fatigue.      Vision         Perception     Praxis      Pertinent Vitals/Pain Pain Assessment: 0-10 Pain Score: 6  Pain Location: left hip Pain Descriptors / Indicators: Guarding;Moaning;Grimacing;Sharp Pain Intervention(s): Monitored during session;Repositioned;Limited activity within patient's tolerance     Hand Dominance Right   Extremity/Trunk Assessment Upper Extremity Assessment Upper Extremity Assessment: Generalized weakness   Lower Extremity Assessment Lower Extremity Assessment: Defer to PT evaluation       Communication Communication Communication: No difficulties   Cognition Arousal/Alertness: Awake/alert Behavior During Therapy: WFL for tasks assessed/performed Overall Cognitive Status: Within Functional Limits for tasks assessed  General Comments       Exercises     Shoulder Instructions      Home Living Family/patient expects to be discharged to:: Private residence Living Arrangements: Alone Available Help at Discharge: Family;Available PRN/intermittently Type of Home: House Home Access: Stairs to  enter [employer] of Steps: 4 Entrance Stairs-Rails: Left Home Layout: One level     Bathroom Shower/Tub: Teacher, early years/pre: Handicapped height     Home Equipment: Environmental consultant - 2 wheels          Prior Functioning/Environment Level of Independence: Independent with assistive device(s)        Comments: ambulates with RW        OT Problem List: Decreased strength;Decreased activity tolerance;Impaired balance (sitting and/or standing);Decreased knowledge of use of DME or AE;Pain      OT Treatment/Interventions: Self-care/ADL training;DME and/or AE instruction;Therapeutic activities;Patient/family education;Balance training    OT Goals(Current goals can be found in the care plan section) Acute Rehab OT Goals Patient Stated Goal: to decrease pain OT Goal Formulation: With patient Time For Goal Achievement: 05/24/18 Potential to Achieve Goals: Fair  OT Frequency: Min 2X/week   Barriers to D/C:            Co-evaluation PT/OT/SLP Co-Evaluation/Treatment: Yes Reason for Co-Treatment: For patient/therapist safety   OT goals addressed during session: ADL's and self-care;Strengthening/ROM      AM-PAC OT "6 Clicks" Daily Activity     Outcome Measure Help from another person eating meals?: None Help from another person taking care of personal grooming?: A Little Help from another person toileting, which includes using toliet, bedpan, or urinal?: Total Help from another person bathing (including washing, rinsing, drying)?: A Lot Help from another person to put on and taking off regular upper body clothing?: A Little Help from another person to put on and taking off regular lower body clothing?: Total 6 Click Score: 14   End of Session Nurse Communication: Mobility status;Patient requests pain meds  Activity Tolerance: Patient limited by fatigue;Patient limited by pain Patient left: in bed;with call bell/phone within reach;with bed alarm set  OT  Visit Diagnosis: Unsteadiness on feet (R26.81);History of falling (Z91.81);Muscle weakness (generalized) (M62.81);Pain Pain - Right/Left: Left Pain - part of body: Hip                Time: 1355-1410 OT Time Calculation (min): 15 min Charges:  OT General Charges $OT Visit: 1 Visit OT Evaluation $OT Eval Moderate Complexity: 1 Mod    Darrol Jump OTR/L [place] [phone] 05/10/2018, 3:09 PM

## 2018-05-10 NOTE — Social Work (Addendum)
2:13pm- CSW attempted to complete assessment with pt, pt currently working with therapy staff, will check back after evaluations. Pt will need insurance approval for SNF placement.   11:15am- CSW acknowledging consult for SNF placement. Will follow for therapy recommendations.   Westley Hummer, MSW, [place] Work [phone]

## 2018-05-10 NOTE — Progress Notes (Signed)
     Subjective: Patient reports pain as mild to moderate, lateral thigh.  Objective:   VITALS:   Vitals:   05/09/18 1212 05/09/18 1448 05/09/18 1930 05/10/18 0358  BP: (!) 103/50 (!) 99/45 (!) 103/44 (!) 121/53  Pulse: 71 73 74 82  Resp: 16 15 14 18   Temp: 97.6 F (36.4 C) 97.8 F (36.6 C) 97.7 F (36.5 C) 97.6 F (36.4 C)  TempSrc: Oral Oral Oral Oral  SpO2: 95% 97% 95% 97%  Weight: 49.1 kg     Height: 5\' 3"  (1.6 m)      CBC Latest Ref Rng & Units 05/10/2018 05/09/2018 03/14/2018  WBC 4.0 - 10.5 K/uL 5.8 6.9 8.6  Hemoglobin 12.0 - 15.0 g/dL 6.0(LL) 7.3(L) 8.5(L)  Hematocrit 36.0 - 46.0 % 18.5(L) 23.2(L) 25.2(L)  Platelets 150 - 400 K/uL 90(L) 90(L) 88(L)   BMP Latest Ref Rng & Units 05/10/2018 05/09/2018 03/14/2018  Glucose 70 - 99 mg/dL 96 102(H) 97  BUN 8 - 23 mg/dL 20 17 21   Creatinine 0.44 - 1.00 mg/dL 0.85 0.76 0.91  BUN/Creat Ratio 12 - 28 - - -  Sodium 135 - 145 mmol/L 135 135 132(L)  Potassium 3.5 - 5.1 mmol/L 4.1 4.3 4.5  Chloride 98 - 111 mmol/L 100 99 96(L)  CO2 22 - 32 mmol/L 27 26 28   Calcium 8.9 - 10.3 mg/dL 7.9(L) 8.1(L) 8.3(L)   Intake/Output      04/22 0701 - 04/23 0700 04/23 0701 - 04/24 0700   P.O. 240    Total Intake(mL/kg) 240 (4.9)    Urine (mL/kg/hr) 350    Stool  1   Total Output 350 1   Net -110 -1          Physical Exam General: NAD.  Supine in bed.  Calm, conversant. LLE: She is able to tolerate left hip small arc range of motion actively and passively without groin pain.  She does have significant discomfort laterally in the area fracture. Flexion, plantarflexion EHL, FHL intact.  Feet warm.  Sensation intact distally  Assessment / Plan: Active Problems:   Closed left hip fracture (HCC)   Closed left periprosthetic femur fracture X-ray and CT images were reviewed by Dr. Percell Miller.  Prosthetic components appear to be stable.      Continue plan for nonoperative management.  This plan along with the risks and benefits discussed with  patient.  She verbalized understanding and agrees-she does not want surgery if it can be avoided.  Mobilize with therapy  Incentive Spirometry  Apply ice PRN   Weightbearing: TDWB LLE Follow - up plan: Plan for repeat x-rays in Dr. Debroah Loop office in 1 to 2 weeks. Contact information:  Kathleen Lynch MD, Roxan Hockey PA-C   Charna Elizabeth Brownell III, PA-C 05/10/2018, 8:33 AM

## 2018-05-10 NOTE — Progress Notes (Signed)
IV fentanyl pulled as requested by PT for pt during therapy session but pt refused medication. Med wasted with Corky Crafts per facility protocol

## 2018-05-10 NOTE — Progress Notes (Addendum)
Critical lab value: Hemoglobin-6.0 MD notified 7:06am

## 2018-05-11 DIAGNOSIS — Z66 Do not resuscitate: Secondary | ICD-10-CM

## 2018-05-11 DIAGNOSIS — Z960 Presence of urogenital implants: Secondary | ICD-10-CM

## 2018-05-11 LAB — CBC WITH DIFFERENTIAL/PLATELET
Abs Immature Granulocytes: 0 10*3/uL (ref 0.00–0.07)
Basophils Absolute: 0 10*3/uL (ref 0.0–0.1)
Basophils Relative: 0 %
Eosinophils Absolute: 0 10*3/uL (ref 0.0–0.5)
Eosinophils Relative: 0 %
HCT: 20.3 % — ABNORMAL LOW (ref 36.0–46.0)
Hemoglobin: 6.8 g/dL — CL (ref 12.0–15.0)
Lymphocytes Relative: 54 %
Lymphs Abs: 3.8 10*3/uL (ref 0.7–4.0)
MCH: 29.8 pg (ref 26.0–34.0)
MCHC: 33.5 g/dL (ref 30.0–36.0)
MCV: 89 fL (ref 80.0–100.0)
Monocytes Absolute: 0.1 10*3/uL (ref 0.1–1.0)
Monocytes Relative: 1 %
Neutro Abs: 3.2 10*3/uL (ref 1.7–7.7)
Neutrophils Relative %: 45 %
Platelets: 94 10*3/uL — ABNORMAL LOW (ref 150–400)
RBC: 2.28 MIL/uL — ABNORMAL LOW (ref 3.87–5.11)
RDW: 16.1 % — ABNORMAL HIGH (ref 11.5–15.5)
WBC: 7 10*3/uL (ref 4.0–10.5)
nRBC: 0.3 % — ABNORMAL HIGH (ref 0.0–0.2)

## 2018-05-11 LAB — PREPARE RBC (CROSSMATCH)

## 2018-05-11 LAB — HEMOGLOBIN AND HEMATOCRIT, BLOOD
HCT: 25.2 % — ABNORMAL LOW (ref 36.0–46.0)
Hemoglobin: 8.4 g/dL — ABNORMAL LOW (ref 12.0–15.0)

## 2018-05-11 LAB — PATHOLOGIST SMEAR REVIEW: Path Review: REACTIVE

## 2018-05-11 MED ORDER — SODIUM CHLORIDE 0.9 % IV SOLN
510.0000 mg | Freq: Once | INTRAVENOUS | Status: AC
  Administered 2018-05-11: 510 mg via INTRAVENOUS
  Filled 2018-05-11: qty 17

## 2018-05-11 NOTE — TOC Progression Note (Signed)
Transition of Care [facility]) - Progression Note    Patient Details  Name: Dequita Schleicher MRN: [id number] Date of Birth: [birth year]  Transition of Care [facility]) CM/SW [place], [place] Phone Number: 05/11/2018, 10:16 AM  Clinical Narrative:     CSW consulted with patient's daughter Olin Hauser who reports she believes patient was in SNF at [facility] from mid February to [facility] March for 4 weeks but is uncertain of exact dates. CSW explained patient's SNF may not be covered by insurance due to patient's recent SNF stay. Olin Hauser reports if so, family can discuss having patient at home. Olin Hauser reports patient has 4 children that have been rotating staying with patient. She has also been exploring [facility] for additional in [facility] services. She reports preference for [place], CSW explained [facility] is most likely not accepting new patients right now but Olin Hauser requested CSW follow up as this is family's preference. CSW will also consult with [facility] for patient's previous dates of stay to determine if SNF will be covered by [employer].   Expected Discharge Plan: [place] Barriers to Discharge: Continued Medical Work up, Ship broker  Expected Discharge Plan and Services Expected Discharge Plan: [place] In-house Referral: Clinical Social Work Discharge Planning Services: NA Post Acute Care Choice: [place] Living arrangements for the past 2 months: Single Family Home                                       Social Determinants of Health (SDOH) Interventions    Readmission Risk Interventions No flowsheet data found.

## 2018-05-11 NOTE — Progress Notes (Signed)
This PT spoke with CM, and CM requesting PT call pt's daughter to address home DME needs. [name] states her mother needs a bilateral forearm RW for ambulation at home due to TTWB status of LLE. PT updated pt's daughter on pt's mobility thus far in PT, stating that pt stood for 10-15 seconds at EOB before fatiguing and requiring a rest. This PT stated she did not think a bilateral forearm RW would address pt deficits post-acutely, and stated that this PT preferred pt to use RW so pt would be able to use both shoulder and tricpes musculature to offweight LLE during transfers/ambulation. PT states that the best option as of now, from this PT's point of view, is stand pivot with RW vs scoot pivot to wheelchair for mobility. Per pt's daughter pt has w/c at home. Pt will have HHPT to address transfers/potential ambulation. [name] is also concerned about keeping pt's strength while she recovers, PT recommends positioning changes, supine LE exercises to maintain LE strength, and working with HHPT. PT to continue to follow pt over the weekend.  Julien Girt, PT Acute Rehabilitation Services Pager [phone]  Office [phone]

## 2018-05-11 NOTE — Progress Notes (Signed)
CSW has received calls X2 from residents regarding this patient and insurance. CSW has explained [employer] and SNF benefits as was explained from [place] where patient was previously at [facility] from 2/18-3/14 per my last note. If residents have further questions regarding insurance you may contact [place] at [phone] or [facility]en Snook) at [phone]. Thank you  [place], [place] [phone]

## 2018-05-11 NOTE — TOC Progression Note (Signed)
Transition of Care [facility]) - Progression Note    Patient Details  Name: Kathleen Arias MRN: [id number] Date of Birth: July 22, 1925  [facility]) CM/SW Contact  Jacalyn Lefevre Edson Snowball, RN Phone Number: 05/11/2018, 1:54 PM  Clinical Narrative:     Discussed discharge planning with daughter Kathleen Arias [phone].   Confirmed PCP DR Kathryne Eriksson, also confirmed address.   Patient has rolling walker , wheel chair , bedside commode and motorized scooter at home already.   Kathleen Arias requesting a bilateral platform walker so patient "can lean on it to support her weight". Explained Arias used rolling walker and purpose of a platform walker, Kathleen Arias requesting to speak to a Arias directly to discuss platform walker.   Kathleen Arias has already been [facility] and wants to remain with them. [facility] to Kathleen [facility] with [facility]   Explained potential discharge today. Kathleen Arias voiced she was told and she would need until Monday or Tuesday to have everything arranged. Explained once patient is medically ready we cannot hold patient in hospital . Kathleen Arias requesting to speak to MD.   Kathleen Arias with Kathleen Arias and DR Alfonse Spruce both will call Kathleen Arias . Potential discharge planned for tomorrow 05/12/18.    Kathleen Arias not sure if she wants PTAR transportation home for her mother or if family will pick her up. She will decide on day of discharge. Expected Discharge Plan: [place] Barriers to Discharge: Continued Medical Work up  Expected Discharge Plan and Services Expected Discharge Plan: [place] In-house Referral: Clinical Social Work Discharge Planning Services: CM Consult Post Acute Care Choice: [place] arrangements for the past 2 months: Single Family Home                 DME Arranged: N/A DME Agency: NA       HH Arranged: Arias, Nurse's Aide   Date HH Agency Contacted: 05/11/18 Time [place]:  [id number] Representative spoke with at [place]: [place]) Interventions    Readmission Risk Interventions No flowsheet data found.

## 2018-05-11 NOTE — Progress Notes (Addendum)
CSW consulted with [facility] to discover patient's SNF days there were from 2/18-3/14, patient has not had 60 day wellness period therefore would be paying out of pocket if discharging to SNF. CSW consulted with patient's daughter Olin Hauser who reports she understands and was online researching their [employer] benefits as well to better understand the SNF coverage process. She is in agreement with patient going home with home health and has been previously attempting to get [facility] and aids in the home for assistance. Patient has 4 children who rotate schedule staying with patient and providing 24/7 supervision. RNCM Heather notified, will set up home services with patient's daughter Olin Hauser.   [place], [place]

## 2018-05-11 NOTE — Progress Notes (Signed)
CRITICAL VALUE ALERT  Critical Value:  Hgb 6.8   Date & Time Notied:  0307 05/11/2018  Provider Notified: [id number]  Orders Received/Actions taken:  Transfuse 1 unit of PRBC

## 2018-05-11 NOTE — Care Management (Signed)
Daughter [name] called NCM back. She will be ready for her mother to be discharged to home tomorrow 05/12/18 . Olin Hauser requesting PTAR to address in [facility].   Magdalen Spatz RN BSN [phone]

## 2018-05-11 NOTE — Care Management Important Message (Signed)
Important Message  Patient Details  Name: Kathleen Arias MRN: [id number] Date of Birth: 1925-06-29   Medicare Important Message Given:   yes   Discussed with daughter [name] via phone.  [name], RN 05/11/2018, 3:14 PM

## 2018-05-11 NOTE — Progress Notes (Signed)
Physical Therapy Treatment Patient Details Name: Kathleen Arias MRN: [id number] DOB: 1925-02-09 Today's Date: 05/11/2018    History of Present Illness Pt is a 85 y.o. female who is admitted with a closed left hip fracture from mechanical fall. PMH significant for heart failure with reduced ejection fraction, non-Hodgkin's lymphoma followed by Dr. [name], previous hip fracture in 2006 and hypothyroidism.    PT Comments    Pt with improved affect during session today, and made less vocalizations about pain during bed mobility/transfers. Pt still with significant anxiety about mobility, and requires max verbal encouragement to participate in PT. Pt able to stand at EOB x3 with max assist +2 this session, with multimodal cuing for safety and form. Pt requires tactile cuing to maintain TTWB precautions on LLE. Pt progressing with mobility slowly, PT to continue to follow acutely.    Follow Up Recommendations  SNF;Supervision/Assistance - 24 hour     Equipment Recommendations  None recommended by PT    Recommendations for Other Services       Precautions / Restrictions Precautions Precautions: Fall Restrictions Weight Bearing Restrictions: Yes LLE Weight Bearing: (toetouch WB)    Mobility  Bed Mobility Overal bed mobility: Needs Assistance Bed Mobility: Supine to Sit;Sit to Supine     Supine to sit: Max assist;HOB elevated;+2 for physical assistance Sit to supine: Max assist;+2 for physical assistance;HOB elevated   General bed mobility comments: Max assist +2 for LE lifting and translation to and from EOB, trunk elevation, and scooting to and from EOB. Pt with heavy posterior leaning upon first sitting EOB, stating "I am falling!!" once sitting EOB. Pt was assured she was safe sitting EOB with PT and mobility tech assist. Pt able to perform lateral scoots to the L prior to returning to supine without PT assist.   Transfers Overall transfer level: Needs assistance Equipment used:  Rolling walker (2 wheeled) Transfers: Sit to/from Stand Sit to Stand: Max assist;+2 physical assistance;+2 safety/equipment;From elevated surface         General transfer comment: Sit to stand x3 for RLE strengthening and to improve standing balance. Max assist +2 for power up, steadying upon standing, reinforcing TTWB when moving from sit to stand and once in standing. Pt able to stand for 15 seconds at a time before fatiguing and needing to sit. Pt declines transfer to recliner at this time.  Ambulation/Gait                 Stairs             Wheelchair Mobility    Modified Rankin (Stroke Patients Only)       Balance Overall balance assessment: Needs assistance Sitting-balance support: Feet supported Sitting balance-Leahy Scale: Fair Sitting balance - Comments: Pt able to sit EOB without UE support, requires max assist to gain sitting balance upon first sitting EOB. Pt able to reach bilateral UEs multidirectionally and anteriorly without LOB.  Postural control: Posterior lean Standing balance support: Bilateral upper extremity supported;During functional activity Standing balance-Leahy Scale: Poor Standing balance comment: Requires max assist +2 to come to standing and maintain standing balance.                             Cognition Arousal/Alertness: Awake/alert Behavior During Therapy: WFL for tasks assessed/performed;Anxious Overall Cognitive Status: Impaired/Different from baseline Area of Impairment: Safety/judgement;Memory;Following commands  Memory: Decreased recall of precautions;Decreased short-term memory Following Commands: Follows one step commands consistently;Follows multi-step commands with increased time Safety/Judgement: Decreased awareness of safety     General Comments: Pt with continued anxiety about mobility, reports that she does not remember PT session from yesterday. PT provided frequent verbal  and tactile cues to maintain TTWB precaution on LLE.       Exercises General Exercises - Lower Extremity Ankle Circles/Pumps: AROM;Both;10 reps;Supine    General Comments        Pertinent Vitals/Pain Pain Assessment: 0-10 Pain Score: 7  Pain Location: left hip Pain Descriptors / Indicators: Guarding;Moaning;Grimacing;Sharp Pain Intervention(s): Monitored during session;Repositioned;Limited activity within patient's tolerance    Home Living                      Prior Function            PT Goals (current goals can now be found in the care plan section) Acute Rehab PT Goals Patient Stated Goal: to decrease pain PT Goal Formulation: With patient Time For Goal Achievement: 05/10/18 Potential to Achieve Goals: Good Progress towards PT goals: Progressing toward goals    Frequency    Min 3X/week      PT Plan Current plan remains appropriate    Co-evaluation              AM-PAC PT "6 Clicks" Mobility   Outcome Measure  Help needed turning from your back to your side while in a flat bed without using bedrails?: A Lot Help needed moving from lying on your back to sitting on the side of a flat bed without using bedrails?: A Lot Help needed moving to and from a bed to a chair (including a wheelchair)?: Total Help needed standing up from a chair using your arms (e.g., wheelchair or bedside chair)?: Total Help needed to walk in hospital room?: Total Help needed climbing 3-5 steps with a railing? : Total 6 Click Score: 8    End of Session Equipment Utilized During Treatment: Gait belt Activity Tolerance: Patient limited by fatigue;Patient limited by pain Patient left: in bed;with bed alarm set;with call bell/phone within reach Nurse Communication: Mobility status PT Visit Diagnosis: Other abnormalities of gait and mobility (R26.89);Muscle weakness (generalized) (M62.81);Repeated falls (R29.6);Pain Pain - Right/Left: Left Pain - part of body: Leg      Time: 5397-6734 PT Time Calculation (min) (ACUTE ONLY): 18 min  Charges:  $Therapeutic Activity: 8-22 mins                     Lonny Eisen Conception Chancy, PT Acute Rehabilitation Services Pager [phone]  Office [phone]    Latorya Bautch D Elonda Husky 05/11/2018, 12:07 PM

## 2018-05-11 NOTE — Progress Notes (Signed)
   Subjective: She reports tolerable pain stating that her left hip is very sore.  She is aware that she has stronger pain medications and can request these.  She states that she is just very disappointed about what has happened and hopes that she will be able to walk again.  He does realize that she will need extensive physical therapy and will be discharged to a SNF.  She states that she will talk to her daughters to get advice on where to go.  Objective:  Vital signs in last 24 hours: Vitals:   05/10/18 1950 05/11/18 0407 05/11/18 0424 05/11/18 0711  BP: (!) 112/44 (!) 118/47 (!) 112/40 (!) 123/53  Pulse: 84 83 78 77  Resp: 15 16 16 15   Temp: 97.7 F (36.5 C) 98.5 F (36.9 C) 99.1 F (37.3 C) 98 F (36.7 C)  TempSrc: Oral Oral Oral Oral  SpO2: 100% 95% 94% 95%  Weight:      Height:       General: Lying in bed in no acute distress CV: Normal rate, regular rhythm Resp: CTAB, normal WOB MSK: Tenderness to palpation of the left hip; no surrounding erythema, warmth, or hematoma to the area. Psych: Normal behavior, mood, and affect  Assessment/Plan:  Active Problems:   Normocytic anemia   Closed left hip fracture (HCC)   Peri-prosthetic intertrochanteric fracture of femur  Ms. Bossard is an 85 year old female was admitted with a closed left hip fracture from mechanical fall.  Closed left hip fracture: - Continue non-operative management with pain control and PT - Pain seems to be well controlled on Tylenol alone.  She also has fentanyl as needed breakthrough pain. - PT evaluated patient yesterday and recommended SNF placement. Social work is working on this.   Normocytic anemia/non-Hodgkin's lymphoma: She required 1 unit of packed red blood cells yesterday for hemoglobin of 6.  Posttransfusion hemoglobin was appropriate 7.7.  Interestingly her Hb has again dropped this morning to 6.8 but I do believe that this is likely due to normal diurnal variations.  She was given 1 unit PRBC  transfusion and IV iron this morning.  Posttransfusion H&H is 8.4. - Continue to need to monitor  Acute urinary retention: Foley in place.  Hypothyroidism: Stable. -Continue home levothyroxine  Heart failure with reduced ejection fraction: Does continue to appear euvolemic on exam. -Continue home torsemide  Hypertension: Blood pressure remains within the septal limits. -Continue holding home medications  FEN/GI: Heart healthy DVT prophylaxis: Subcu Lovenox CODE STATUS: DNR  Dispo: Anticipated discharge pending SNF placement.   Carroll Sage, MD 05/11/2018, 7:25 AM Pager: [phone]

## 2018-05-12 LAB — TYPE AND SCREEN
ABO/RH(D): O NEG
Antibody Screen: NEGATIVE
Unit division: 0
Unit division: 0

## 2018-05-12 LAB — BPAM RBC
Blood Product Expiration Date: 202004302359
Blood Product Expiration Date: 202005012359
ISSUE DATE / TIME: 202004230829
ISSUE DATE / TIME: 202004240400
Unit Type and Rh: 9500
Unit Type and Rh: 9500

## 2018-05-12 LAB — HEMOGLOBIN AND HEMATOCRIT, BLOOD
HCT: 24.4 % — ABNORMAL LOW (ref 36.0–46.0)
Hemoglobin: 8.4 g/dL — ABNORMAL LOW (ref 12.0–15.0)

## 2018-05-12 MED ORDER — SENNOSIDES-DOCUSATE SODIUM 8.6-50 MG PO TABS
1.0000 | ORAL_TABLET | Freq: Once | ORAL | Status: AC
  Administered 2018-05-12: 1 via ORAL
  Filled 2018-05-12: qty 1

## 2018-05-12 MED ORDER — POLYETHYLENE GLYCOL 3350 17 G PO PACK: 17.0000 g | PACK | Freq: Every day | ORAL | Status: DC | PRN

## 2018-05-12 NOTE — Progress Notes (Signed)
   Subjective: Patient reports that she is feeling better, she reports that she is going home today. She spoke with her daughter and she states that they are both all for it. She denies any other acute complaints.   Objective:  Vital signs in last 24 hours: Vitals:   05/11/18 0711 05/11/18 1300 05/11/18 2207 05/12/18 0500  BP: (!) 123/53  (!) 115/57 115/70  Pulse: 77 74 78 78  Resp: 15  20 20   Temp: 98 F (36.7 C) 97.8 F (36.6 C) 97.8 F (36.6 C) 97.6 F (36.4 C)  TempSrc: Oral Oral Oral Oral  SpO2: 95% 94% 93% 96%  Weight:      Height:        General: Lying in bed in no acute distress Extremity: No overlying erythema, warmth, and hematoma over left hip, no LE edema bilaterally. Psychiatry: Normal behavior, mood, and affecct   Assessment/Plan:  Active Problems:   Normocytic anemia   Closed left hip fracture ([place])   Peri-prosthetic intertrochanteric fracture of femur  Ms. Donalson is an 85 year old female was admitted with a closed left hip fracture from mechanical fall.  Closed left hip fracture: She is medically stable for discharge. She will continue non-operative management with pain control and PT. Unfortunately medicare will not pay for another stay in SNF. Plan is for her to go home with home PT/home health.  Normocytic anemia/non-Hodgkin's lymphoma: Hb stable this morning at 8.4 after a total of 2u pRBC during this admission. She will need a repeat CBC at hospital follow-up.   Acute urinary retention: Will remove foley catheter today and see if she can void spontaneously prior to discharge.   Hypothyroidism: Stable. - Continue home levothyroxine  Heart failure with reduced ejection fraction: - Continue home torsemide  Hypertension: Blood pressure remains within the septal limits. - Will hold home medications at discharge.   FEN/GI: Heart healthy DVT prophylaxis: Subcu Lovenox CODE STATUS: DNR  Dispo: Anticipated discharge today.  Carroll Sage,  MD 05/12/2018, 8:32 AM Pager: [phone]

## 2018-05-12 NOTE — Progress Notes (Signed)
Authorization started with [employer]. Pending review. Reference number for case is [id number]. Faxed over clinical notes as requested by [employer]. Awaiting determination from insurance who will call back for final results. Contacted daughter and informed. MD made aware. Will continue to assist as needed.

## 2018-05-12 NOTE — Progress Notes (Signed)
Physical Therapy Treatment Patient Details Name: Kathleen Arias MRN: [id number] DOB: 10-Sep-1925 Today's Date: 05/12/2018    History of Present Illness Pt is a 85 y.o. female who is admitted with a closed left hip fracture from mechanical fall. PMH significant for heart failure with reduced ejection fraction, non-Hodgkin's lymphoma followed by Dr. [name], previous hip fracture in 2006 and hypothyroidism.    PT Comments    Pt continues to require heavy physical assistance of two for all mobility. She remains very limited secondary to weakness and pain. Pt would continue to benefit from skilled physical therapy services at this time while admitted and after d/c to address the below listed limitations in order to improve overall safety and independence with functional mobility.    Follow Up Recommendations  SNF;Supervision/Assistance - 24 hour     Equipment Recommendations  None recommended by PT    Recommendations for Other Services       Precautions / Restrictions Precautions Precautions: Fall Restrictions Weight Bearing Restrictions: Yes LLE Weight Bearing: Touchdown weight bearing    Mobility  Bed Mobility Overal bed mobility: Needs Assistance Bed Mobility: Supine to Sit     Supine to sit: Max assist;HOB elevated;+2 for physical assistance     General bed mobility comments: max A x2 with use of bed pads to position pt's hips at EOB; pt able to use bilateral UEs on bed rails to help   Transfers                 General transfer comment: pt refusing  Ambulation/Gait                 Stairs             Wheelchair Mobility    Modified Rankin (Stroke Patients Only)       Balance Overall balance assessment: History of Falls;Needs assistance Sitting-balance support: No upper extremity supported Sitting balance-Leahy Scale: Good Sitting balance - Comments: pt able to sit EOB and begin eating her lunch with total A for set up                                    Cognition Arousal/Alertness: Awake/alert Behavior During Therapy: [facility] for tasks assessed/performed;Anxious Overall Cognitive Status: Impaired/Different from baseline Area of Impairment: Safety/judgement;Memory;Following commands                     Memory: Decreased recall of precautions;Decreased short-term memory Following Commands: Follows one step commands consistently;Follows multi-step commands with increased time Safety/Judgement: Decreased awareness of safety            Exercises General Exercises - Lower Extremity Ankle Circles/Pumps: AROM;Both;10 reps;Supine Long Arc Quad: AROM;Both;10 reps;Seated    General Comments        Pertinent Vitals/Pain Pain Assessment: Faces Faces Pain Scale: Hurts even more Pain Location: left hip Pain Descriptors / Indicators: Guarding;Grimacing Pain Intervention(s): Monitored during session;Repositioned    Home Living                      Prior Function            PT Goals (current goals can now be found in the care plan section) Acute Rehab PT Goals PT Goal Formulation: With patient Time For Goal Achievement: 05/10/18 Potential to Achieve Goals: Good Progress towards PT goals: Progressing toward goals    Frequency    Min 3X/week  PT Plan Current plan remains appropriate    Co-evaluation              AM-PAC PT "6 Clicks" Mobility   Outcome Measure  Help needed turning from your back to your side while in a flat bed without using bedrails?: A Lot Help needed moving from lying on your back to sitting on the side of a flat bed without using bedrails?: A Lot Help needed moving to and from a bed to a chair (including a wheelchair)?: Total Help needed standing up from a chair using your arms (e.g., wheelchair or bedside chair)?: Total Help needed to walk in hospital room?: Total Help needed climbing 3-5 steps with a railing? : Total 6 Click Score: 8    End of  Session   Activity Tolerance: Patient limited by pain;Patient limited by fatigue Patient left: in bed;with call bell/phone within reach;with bed alarm set;Other (comment)(sitting EOB to eat lunch) Nurse Communication: Mobility status PT Visit Diagnosis: Other abnormalities of gait and mobility (R26.89);Muscle weakness (generalized) (M62.81);Repeated falls (R29.6);Pain Pain - Right/Left: Left Pain - part of body: Leg     Time: 0045-9977 PT Time Calculation (min) (ACUTE ONLY): 15 min  Charges:  $Therapeutic Activity: 8-22 mins                     Sherie Don, PT, DPT  Acute Rehabilitation Services Pager [phone] Office [place] 05/12/2018, 2:44 PM

## 2018-05-12 NOTE — Plan of Care (Signed)
  Problem: Clinical Measurements: Goal: Will remain free from infection Outcome: Progressing   Problem: Pain Managment: Goal: General experience of comfort will improve Outcome: Progressing   

## 2018-05-13 DIAGNOSIS — I5020 Unspecified systolic (congestive) heart failure: Secondary | ICD-10-CM

## 2018-05-13 DIAGNOSIS — I5022 Chronic systolic (congestive) heart failure: Secondary | ICD-10-CM | POA: Diagnosis present

## 2018-05-13 MED ORDER — TORSEMIDE 10 MG PO TABS
10.0000 mg | ORAL_TABLET | Freq: Every day | ORAL | Status: DC
  Administered 2018-05-13 – 2018-05-15 (×3): 10 mg via ORAL
  Filled 2018-05-13 (×3): qty 1

## 2018-05-13 MED ORDER — CARVEDILOL 3.125 MG PO TABS
3.1250 mg | ORAL_TABLET | Freq: Two times a day (BID) | ORAL | Status: DC
  Administered 2018-05-13 – 2018-05-15 (×4): 3.125 mg via ORAL
  Filled 2018-05-13 (×4): qty 1

## 2018-05-13 MED ORDER — ALUM & MAG HYDROXIDE-SIMETH 200-200-20 MG/5ML PO SUSP
30.0000 mL | Freq: Four times a day (QID) | ORAL | Status: DC | PRN
  Administered 2018-05-13: 30 mL via ORAL
  Filled 2018-05-13: qty 30

## 2018-05-13 MED ORDER — LOSARTAN POTASSIUM 25 MG PO TABS
25.0000 mg | ORAL_TABLET | Freq: Every day | ORAL | Status: DC
  Administered 2018-05-13 – 2018-05-14 (×2): 25 mg via ORAL
  Filled 2018-05-13 (×2): qty 1

## 2018-05-13 NOTE — Progress Notes (Addendum)
   Subjective: She has not had any issues with urinary retention since discontinuing the Foley catheter.  She reports good pain control with Tylenol alone.  She is aware that she has stronger medications if needed.  She does state that she would like to go home but realizes the importance of continued physical therapy and nursing facility placement.  She has not have any other acute complaints.  Objective:  Vital signs in last 24 hours: Vitals:   05/12/18 0500 05/12/18 1403 05/12/18 2027 05/13/18 0518  BP: 115/70 (!) 125/50 108/81 122/60  Pulse: 78 76 83 76  Resp: 20 18 18 18   Temp: 97.6 F (36.4 C) 97.9 F (36.6 C) 98.3 F (36.8 C) 97.8 F (36.6 C)  TempSrc: Oral Oral Oral Oral  SpO2: 96% 96% 96% 94%  Weight:      Height:       General: Lying in bed in no acute distress MSK: Tenderness to palpation of the left hip.  No surrounding erythema, warmth, or hematoma noted at the left hip.  No lower extremity edema, erythema, or warmth bilaterally. Psych: Normal behavior, mood, affect  Assessment/Plan:  Active Problems:   Normocytic anemia   Closed left hip fracture (HCC)   Peri-prosthetic intertrochanteric fracture of femur  Kathleen Arias is an 85 year old female was admitted with a closed left hip fracture from mechanical fall.  Closed left hip fracture: - She remains medically stable for discharge and is awaiting insurance authorization for SNF placement. - Continue pain-control with scheduled Tylenol; Fentanyl available PRN - Continue PT  Normocytic anemia/non-Hodgkin's lymphoma: Hb yesterday stable at 8.4. She will need a repeat CBC at hospital follow-up.   Acuteurinary retention: Foley removed. She is voiding spontaneously.  Hypothyroidism: Stable. - Continue home levothyroxine  Heart failure with reduced ejection fraction: Continues to appear euvolemic on exam.  - Continue home torsemide PRN  Hypertension: Blood pressure remains within acceptable limits. - Will  restart carvedilol and losartan today.  FEN/GI: Hearthealthy DVT prophylaxis:Subcu Lovenox CODE STATUS:DNR  Dispo: Anticipated discharge pending insurance authorization for SNF placement.   Carroll Sage, MD 05/13/2018, 7:17 AM Pager: [phone]

## 2018-05-13 NOTE — Progress Notes (Addendum)
Authorization confirmed by [employer]. [employer] requesting facility of choice for SNF rehab. CSW called daughter and left multiple voicemails for callback on facility of choice. Point of contact for [employer] that will assist in facilitating authorization is Claiborne Billings [phone]). Will continue to call daughter for decision on facility. Reference number for case is [id number]  *Update:  Received call from daughter. Daughter prefers placement at [facility] and will like facility to review again before making final determination on placement. CSW did relay multiple contact was made to [facility] and have received no reply. Daughter again, was consistent in inquiring for possible placement at [facility] and wants confirmation if facility can receive patient. If [facility] is unable, daughter willing to consider [facility] can offer bed for placement. CSW acknowledged. Will continue to attempt for [facility] on SNF rehab placement.   *Update  Daughter called late in day and decided on [facility] for SNF rehab. [facility] does have bed available and can take patient tomorrow 05/14/2018. Daughter agrees with plan.

## 2018-05-13 NOTE — Plan of Care (Signed)

## 2018-05-13 NOTE — Progress Notes (Signed)
  Date: 05/13/2018  Patient name: Kathleen Arias  Medical record number: [id number]  Date of birth: 1925-10-04   I have seen and evaluated this patient and I have discussed the plan of care with the house staff. Please see their note for complete details. I concur with their findings with the following additions/corrections:   85 year old woman here for left hip fracture.  Doing well with pain control, still working on rehab placement for her.  She also has HFrEF, and mild volume overload on exam.  We will resume her home medications including torsemide.  Lenice Pressman, M.D., Ph.D. 05/13/2018, 2:26 PM

## 2018-05-14 ENCOUNTER — Ambulatory Visit: Payer: Medicare Other | Admitting: Hematology

## 2018-05-14 ENCOUNTER — Other Ambulatory Visit: Payer: Medicare Other

## 2018-05-14 NOTE — Plan of Care (Signed)

## 2018-05-14 NOTE — Progress Notes (Signed)
CSW has been in contact with patient's daughter Olin Hauser regarding false information given over the weekend from  Dr. Alfonse Spruce. Olin Hauser states Dr. Alfonse Spruce called her and reported insurance of [employer] is covering patient's SNF. This information is not accurate. Patient was approved for SNF based on clinicals sent, however billing was not addressed by weekend staff. Although approved, SNF is NOT fully covered financially through patient's plan, patient will pay $178 co pay rate per day at SNF. This was confirmed with Isaias Cowman who processed patient's insurance information. The co pay information was also communicated to residents last week by this CSW.  Olin Hauser reports being frustrated with false information given to her by MD, CSW apologized for weekend staff mis informing patient's daughter. CSW gave Olin Hauser correct information in which she will be required to pay [facility] the $178/day a week in advance before patient discharges. Olin Hauser is requesting patient discharge tomorrow morning to give her today to arrange finances and pay Miquel Dunn the co pay funds required by [employer]. Olivia Mackie at [facility] is also in communication with Olin Hauser regarding this process.   Please contact CSW with any questions regarding this patient's discharge plan, thank you.   [place], [place]

## 2018-05-14 NOTE — Progress Notes (Signed)
   Subjective: Patient was seen and evaluated at bedside on morning rounds. She mentions that her pain has remained controled. She is waiting and willing to be discharged. She denies any new symptoms. No acute events overnight.   Objective:   Vital signs in last 24 hours: Vitals:   05/13/18 1315 05/13/18 1933 05/14/18 0310 05/14/18 0500  BP: 119/67 (!) 127/52 (!) 134/50   Pulse: 72 72 71   Resp: 17     Temp: 97.8 F (36.6 C) 98.1 F (36.7 C) 97.7 F (36.5 C)   TempSrc: Oral Oral Oral   SpO2: 95% 97% 95%   Weight:    51.1 kg  Height:       Physical Exam:  VS reviewed, nursing notes reviewed. General: Pleasant lady, sitting on th bed in no acute distress CV: RRR, Nl S1S2 Pulm: Nl work of breathing, CTA bilaterally Abdomen: Distended but soft and non-tender to palpation. BS are present Musculoskeletal: No LEE, pulses are palpable bilaterally, Has pain with left movement Neurologic exam: Alert and oriented x 3 Psychiatric exam: Nl mood and behavior  Assessment/Plan:  Principal Problem:   Peri-prosthetic intertrochanteric fracture of femur Active Problems:   Normocytic anemia   Closed left hip fracture (HCC)   Chronic HFrEF (heart failure with reduced ejection fraction) ([place])  Kathleen Arias is an 85 y/o female was admitted with a closed left hip (periprosthetic) fracture from mechanical fall. Orthopedic has been on board and did not recommend surgery. She has been hospitalized for pain management and has received PT/O. Her pain has been well controled and medically stable to discharge to SNF. Awaiting placement.  - Appreciate SW follow up for SNF placement. - Continue pain-control with scheduled Tylenol, Fentanyl available PRN - Continue PT   ADDENDUM: Per Education officer, museum, patient has copay of  $170 per day for SNF. She has talked to daughter regarding that and she agrees to provide the finance these days (likely tomorrow) and go ahead for DC her tomorrow.  Normocytic  anemia/non-Hodgkin's lymphoma: Hb yesterday stable at 8.4. -Repeat CBC at hospital follow-up.    Hypothyroidism: Stable. -Continue home levothyroxine  Heart failure with reduced ejection fraction: She has been euvolemic on exam.  -Continue home torsemide, carvedilol and Losartan (resumed yesterday)  Hypertension: BP remains within acceptable limits. -Ct carvedilol and losartan today.  FEN/GI: Hearthealthy DVT prophylaxis:Subcu Lovenox CODE STATUS:DNR  Dispo: Discharge pending for SNF placement  Kathleen Hatch, MD 05/14/2018, 9:33 AM Pager: [phone]

## 2018-05-14 NOTE — Care Management Important Message (Signed)
Important Message  Patient Details  Name: Kathleen Arias MRN: [id number] Date of Birth: [birth year]   Medicare Important Message Given:  Yes    Rishan Oyama [address] 05/14/2018, 4:22 PM

## 2018-05-14 NOTE — Progress Notes (Signed)
Physical Therapy Treatment Patient Details Name: Kathleen Arias MRN: [id number] DOB: September 29, 1925 Today's Date: 05/14/2018    History of Present Illness Pt is a 85 y.o. female who is admitted with a closed left hip fracture from mechanical fall. PMH significant for heart failure with reduced ejection fraction, non-Hodgkin's lymphoma followed by Dr. [name], previous hip fracture in 2006 and hypothyroidism.    PT Comments    Pt is slowly progressing with mobility, today she was able to transfer bed to recliner with + 2 mod assist. Performed LLE exercises with min assist.   Follow Up Recommendations  SNF;Supervision/Assistance - 24 hour;Supervision for mobility/OOB     Equipment Recommendations  None recommended by PT    Recommendations for Other Services       Precautions / Restrictions Precautions Precautions: Fall Restrictions Weight Bearing Restrictions: Yes LLE Weight Bearing: Touchdown weight bearing    Mobility  Bed Mobility Overal bed mobility: Needs Assistance Bed Mobility: Supine to Sit     Supine to sit: Min assist;HOB elevated     General bed mobility comments: VCs for hand placement and assist to pivot hips using pad and to scoot hips to EOB.  Transfers Overall transfer level: Needs assistance Equipment used: Rolling walker (2 wheeled) Transfers: Sit to/from [facility] Sit to Stand: +2 physical assistance;Mod assist Stand pivot transfers: +2 physical assistance;Mod assist       General transfer comment: +2 mod assist to power up from bed and for hand placement on RW, cues for upright posture.  Pt performs SPT from bed to recliner with RW with +2 mod assist and seems to have a difficult time maintaining TWB status.    Ambulation/Gait                 Stairs             Wheelchair Mobility    Modified Rankin (Stroke Patients Only)       Balance Overall balance assessment: History of Falls;Needs assistance Sitting-balance  support: No upper extremity supported;Feet supported Sitting balance-Leahy Scale: Good     Standing balance support: Bilateral upper extremity supported Standing balance-Leahy Scale: Poor                              Cognition Arousal/Alertness: Awake/alert Behavior During Therapy: WFL for tasks assessed/performed Overall Cognitive Status: Within Functional Limits for tasks assessed                                 General Comments: WFL for simple tasks      Exercises General Exercises - Lower Extremity Ankle Circles/Pumps: AROM;Both;10 reps;Supine Quad Sets: AROM;5 reps;Left;Supine Heel Slides: AAROM;Left;10 reps;Supine Hip ABduction/ADduction: AAROM;Left;10 reps;Supine    General Comments        Pertinent Vitals/Pain Pain Assessment: 0-10 Pain Score: 5  Pain Location: left hip Pain Descriptors / Indicators: Guarding;Grimacing Pain Intervention(s): Limited activity within patient's tolerance;Monitored during session;Premedicated before session;Repositioned    Home Living                      Prior Function            PT Goals (current goals can now be found in the care plan section) Acute Rehab PT Goals Patient Stated Goal: to decrease pain PT Goal Formulation: With patient Time For Goal Achievement: 05/28/18 Potential to Achieve Goals: Good  Progress towards PT goals: Progressing toward goals    Frequency    Min 3X/week      PT Plan Current plan remains appropriate    Co-evaluation PT/OT/SLP Co-Evaluation/Treatment: Yes Reason for Co-Treatment: For patient/therapist safety;To address functional/ADL transfers PT goals addressed during session: Mobility/safety with mobility;Balance;Proper use of DME;Strengthening/ROM OT goals addressed during session: ADL's and self-care;Other (comment)(functional transfer)      AM-PAC PT "6 Clicks" Mobility   Outcome Measure  Help needed turning from your back to your side while  in a flat bed without using bedrails?: A Little Help needed moving from lying on your back to sitting on the side of a flat bed without using bedrails?: A Little Help needed moving to and from a bed to a chair (including a wheelchair)?: A Lot Help needed standing up from a chair using your arms (e.g., wheelchair or bedside chair)?: A Lot Help needed to walk in hospital room?: Total Help needed climbing 3-5 steps with a railing? : Total 6 Click Score: 12    End of Session Equipment Utilized During Treatment: Gait belt Activity Tolerance: Patient limited by pain;Patient limited by fatigue Patient left: with call bell/phone within reach;in chair;with chair alarm set Nurse Communication: Mobility status PT Visit Diagnosis: Other abnormalities of gait and mobility (R26.89);Muscle weakness (generalized) (M62.81);Repeated falls (R29.6);Pain Pain - Right/Left: Left Pain - part of body: Leg     Time: 1025-8527 PT Time Calculation (min) (ACUTE ONLY): 16 min  Charges:  $Therapeutic Activity: 8-22 mins                     Blondell Reveal Kistler PT 05/14/2018  Acute Rehabilitation Services Pager [phone] Office [phone]

## 2018-05-14 NOTE — Progress Notes (Signed)
  Date: 05/14/2018  Patient name: Kathleen Arias  Medical record number: [id number]  Date of birth: 22-Mar-1925   I have seen and evaluated this patient and I have discussed the plan of care with the house staff. Please see their note for complete details. I concur with their findings with the following additions/corrections:   Doing well, pain is well controlled with acetaminophen.  We resumed her heart failure medications yesterday and she seems to be doing well on this.  She still has some evidence of mild volume overload with JVP to the mid neck sitting upright.  We will continue scheduled torsemide for now and reassess her volume status daily.  We are hopeful she will be able to discharge to SNF tomorrow for rehab.  Lenice Pressman, M.D., Ph.D. 05/14/2018, 4:08 PM

## 2018-05-14 NOTE — Progress Notes (Signed)
Occupational Therapy Treatment Patient Details Name: Kathleen Arias MRN: [id number] DOB: [birth year] Today's Date: 05/14/2018    History of present illness Pt is a 85 y.o. female who is admitted with a closed left hip fracture from mechanical fall. PMH significant for heart failure with reduced ejection fraction, non-Hodgkin's lymphoma followed by Dr. [name], previous hip fracture in 2006 and hypothyroidism.   OT comments  Pt progressing towards goals. She is demonstrating increased independence with bed mobility tasks. Pt requires +2 mod assist for sit to stand and SPT to recliner, also seems to have difficult time maintaining TWB status.  Continue to recommend SNF for discharge planning since pt requires max assist for LB ADLs and +2 assist for functional mobility.  Pt's RN mentions today that pt may be discharging home. However, social work documentation in chart indicates discharge to SNF.  If pt were to discharge home, she will require HHOT and 24/7 supervision/assist. Will continue to follow acutely.   Follow Up Recommendations  SNF;Supervision/Assistance - 24 hour    Equipment Recommendations  Other (comment)(defer to next venue)    Recommendations for Other Services      Precautions / Restrictions Precautions Precautions: Fall Restrictions Weight Bearing Restrictions: Yes LLE Weight Bearing: Touchdown weight bearing       Mobility Bed Mobility Overal bed mobility: Needs Assistance Bed Mobility: Supine to Sit     Supine to sit: Min assist;HOB elevated     General bed mobility comments: VCs for hand placement and assist to pivot hips using pad and to scoot hips to EOB.  Transfers Overall transfer level: Needs assistance Equipment used: Rolling walker (2 wheeled) Transfers: Sit to/from [facility] Sit to Stand: +2 physical assistance;Mod assist Stand pivot transfers: +2 physical assistance;Mod assist       General transfer comment: +2 mod assist to power  up from bed and for hand placement on RW, cues for upright posture.  Pt performs SPT from bed to recliner with RW with +2 mod assist and seems to have a difficult time maintaining TWB status.      Balance Overall balance assessment: History of Falls;Needs assistance Sitting-balance support: No upper extremity supported;Feet supported Sitting balance-Leahy Scale: Good     Standing balance support: Bilateral upper extremity supported Standing balance-Leahy Scale: Poor                             ADL either performed or assessed with clinical judgement   ADL Overall ADL's : Needs assistance/impaired     Grooming: Brushing hair;Sitting;Supervision/safety               Lower Body Dressing: Maximal assistance;Adhering to back precautions   Toilet Transfer: +2 for physical assistance;Moderate assistance;Stand-pivot;RW Toilet Transfer Details (indicate cue type and reason): Simulated with transfer from bed to recliner                 Vision       Perception     Praxis      Cognition Arousal/Alertness: Awake/alert Behavior During Therapy: [facility] for tasks assessed/performed;Anxious Overall Cognitive Status: Within Functional Limits for tasks assessed                                 General Comments: [facility] for simple tasks        Exercises     Shoulder Instructions  General Comments      Pertinent Vitals/ Pain       Pain Assessment: 0-10 Pain Score: 5  Pain Location: left hip Pain Descriptors / Indicators: Guarding;Grimacing Pain Intervention(s): Limited activity within patient's tolerance;Monitored during session;Repositioned  Home Living                                          Prior Functioning/Environment              Frequency  Min 2X/week        Progress Toward Goals  OT Goals(current goals can now be found in the care plan section)  Progress towards OT goals: Progressing toward  goals  Acute Rehab OT Goals Patient Stated Goal: to decrease pain OT Goal Formulation: With patient Time For Goal Achievement: 05/24/18 Potential to Achieve Goals: Fair ADL Goals Pt Will Perform Upper Body Bathing: with supervision;sitting Pt Will Perform Lower Body Bathing: with mod assist;with adaptive equipment;sit to/from stand Pt Will Transfer to Toilet: with mod assist;stand pivot transfer;bedside commode Pt Will Perform Toileting - Clothing Manipulation and hygiene: with mod assist;sitting/lateral leans;sit to/from stand Additional ADL Goal #1: Pt will perform bed mobility with mod assist as precursor for EOB ADLs.  Plan Discharge plan remains appropriate    Co-evaluation    PT/OT/SLP Co-Evaluation/Treatment: Yes Reason for Co-Treatment: For patient/therapist safety   OT goals addressed during session: ADL's and self-care;Other (comment)(functional transfer)      AM-PAC OT "6 Clicks" Daily Activity     Outcome Measure   Help from another person eating meals?: None Help from another person taking care of personal grooming?: A Little Help from another person toileting, which includes using toliet, bedpan, or urinal?: A Lot Help from another person bathing (including washing, rinsing, drying)?: A Lot Help from another person to put on and taking off regular upper body clothing?: A Little Help from another person to put on and taking off regular lower body clothing?: A Lot 6 Click Score: 16    End of Session Equipment Utilized During Treatment: Gait belt;Rolling walker  OT Visit Diagnosis: Unsteadiness on feet (R26.81);History of falling (Z91.81);Muscle weakness (generalized) (M62.81);Pain Pain - Right/Left: Left Pain - part of body: Hip   Activity Tolerance Patient tolerated treatment well   Patient Left in bed;with call bell/phone within reach   Nurse Communication Mobility status        Time: 5009-3818 OT Time Calculation (min): 14 min  Charges: OT General  Charges $OT Visit: 1 Visit OT Treatments $Therapeutic Activity: 8-22 mins     Darrol Jump  OTR/L [place] [phone] 05/14/2018, 10:01 AM

## 2018-05-15 DIAGNOSIS — Z8781 Personal history of (healed) traumatic fracture: Secondary | ICD-10-CM

## 2018-05-15 MED ORDER — TORSEMIDE 10 MG PO TABS: 10.0000 mg | ORAL_TABLET | Freq: Every day | ORAL | 0 refills | Status: DC

## 2018-05-15 NOTE — TOC Transition Note (Addendum)
Transition of Care [facility]) - CM/SW Discharge Note   Patient Details  Name: Kathleen Arias MRN: [id number] Date of Birth: 07-Jul-1925  Transition of Care [facility]) CM/SW Contact:  Alberteen Sam, LCSW Phone Number: 05/15/2018, 10:08 AM   Clinical Narrative:     Patient will DC to: [facility] Anticipated DC date: 05/15/2018 Family notified: Pam Transport BZ:[facility]  Per MD patient ready for DC to [facility]. RN, patient, patient's family, and facility notified of DC. Discharge Summary sent to facility. RN given number for report [phone] Room 606. DC packet on chart. Ambulance transport requested for patient for 12:30 pm.  CSW signing off.  [place], [place]   Final next level of care: Skilled Nursing Facility Barriers to Discharge: No Barriers Identified   Patient Goals and CMS Choice Patient states their goals for this hospitalization and ongoing recovery are:: to feel better CMS Medicare.gov Compare Post Acute Care list provided to:: Patient Represenative (must comment)([name] (daughter)) Choice offered to / list presented to : Adult Children  Discharge Placement PASRR number recieved: 05/10/18            Patient chooses bed at: [facility] Patient to be transferred to facility by: [place] Name of family member notified: Pam Patient and family notified of of transfer: 05/15/18  Discharge Plan and Services In-house Referral: Clinical Social Work Discharge Planning Services: CM Consult Post Acute Care Choice: Home Health          DME Arranged: N/A DME Agency: NA       HH Arranged: NA [place] Agency: NA Date HH Agency Contacted: 05/11/18 Time [place]: 1025 Representative spoke with at [place]: [place] ([place]) Interventions     Readmission Risk Interventions No flowsheet data found.

## 2018-05-15 NOTE — Progress Notes (Signed)
PT Cancellation Note  Patient Details Name: Kathleen Arias MRN: [id number] DOB: [birth year]   Cancelled Treatment:    Reason Eval/Treat Not Completed: Patient declined, no reason specified. Pt discharging today and reports that she doesn't feel good and cannot work with therapy for mobility or bed level exercises. Encouraged her but she continued to politely refuse.   Leighton Roach, [place]  Pager [phone] Office [place] 05/15/2018, 11:47 AM

## 2018-05-15 NOTE — Progress Notes (Signed)
Report given to Nurse that will receive room 606. No further questions.

## 2018-05-15 NOTE — Plan of Care (Signed)

## 2018-05-15 NOTE — Progress Notes (Signed)
   Subjective: Patient was seen and evaluated at bedside on morning rounds. No acute events overnight.  She is doing well, and is waiting to be discharged.  Denies any symptoms and does not have any complaints.  Objective:  Vital signs in last 24 hours: Vitals:   05/14/18 0500 05/14/18 1318 05/14/18 2127 05/15/18 0515  BP:  (!) 93/42 (!) 127/49 (!) 127/53  Pulse:  67 68 72  Resp:  15 18 17   Temp:  98.7 F (37.1 C) 97.8 F (36.6 C) (!) 97.3 F (36.3 C)  TempSrc:  Oral Oral Oral  SpO2:  96% 98% 96%  Weight: 51.1 kg   51 kg  Height:       Physical Exam:  VS reviewed, nursing notes reviewed. General: Pleasant lady lying in the bed in no acute distress CV: RRR, normal S1-S2, no JVD Pulm: CTA bilaterally, no wheeze Abdomen: Is distended but soft and non-tender to palpation Musculoskeletal: No lower extremity edema Neurologic exam: Alert and oriented x3  Assessment/Plan:  Principal Problem:   Peri-prosthetic intertrochanteric fracture of femur Active Problems:   Normocytic anemia   Closed left hip fracture (HCC)   Chronic HFrEF (heart failure with reduced ejection fraction) ([place])   Peri-prosthtic closed left femur Fx. Ms. Mullan is an 85 y/o female was admitted with a closed left hip (periprosthetic) fracture from mechanical fall. Orthopedic has been on board and did not recommend surgery. She has been hospitalized for pain management and has received PT/O. Her pain has been well controled and medically stable to discharge to SNF.  -DC to SNF with Tylenol for pain  Normocytic anemia/non-Hodgkin's lymphoma: LastHb stable at 8.4. -Repeat CBC at hospital follow-up.  Hypothyroidism: Stable. -Continue home levothyroxine   Heart failure with reduced ejection fraction:Changed to PRN torsemide change to scheduled torsemide. We will discharging her with scheduled torsemide will continue carvedilol and Losartan home.  Hypertension: Stable -Ct carvedilol and losartan    Dispo: DC to SNF today  [name], MD 05/15/2018, 7:20 AM Pager: [phone]

## 2018-05-31 ENCOUNTER — Telehealth: Payer: Self-pay | Admitting: *Deleted

## 2018-05-31 NOTE — Telephone Encounter (Signed)
Patient fractured femur and is at [facility] currently). Contacted by daughter Volney Presser who asked [place] to fax results from CBC/diff and CMP completed 5/11 to Dr. Grier Mitts office. Results received and will be sent to HIM to be scanned into patient chart.  Daughter notified that results received and will be given to Dr. [name] for review. Ms. Joneen Caraway verbalized understanding.

## 2018-06-05 ENCOUNTER — Telehealth: Payer: Self-pay | Admitting: *Deleted

## 2018-06-05 NOTE — Telephone Encounter (Signed)
Contacted daughter Kathleen Arias. Dr. [name] would like to have patient make appointment once rehab has progressed to allow it. Anticipating discharge from [facility], tentatively 5/20 (under review w/[facility]BS). Once home, will receive home therapy 3 times a week. Family will be staying with her. Patient will see Dr. Percell Miller on June 3 and daughter states will be allowed weight bearing at that time.  Encouraged daughter to contact [facility] for f/u appt with Dr. [name] as soon as patient is physically able to come. Daughter verbalized understanding.

## 2018-06-19 ENCOUNTER — Inpatient Hospital Stay (HOSPITAL_COMMUNITY)
Admission: EM | Admit: 2018-06-19 | Discharge: 2018-06-23 | DRG: 640 | Disposition: A | Discharge status: Home Health | Payer: Medicare Other | Attending: Internal Medicine | Admitting: Internal Medicine

## 2018-06-19 ENCOUNTER — Emergency Department (HOSPITAL_COMMUNITY): Payer: Medicare Other

## 2018-06-19 ENCOUNTER — Other Ambulatory Visit: Payer: Self-pay

## 2018-06-19 ENCOUNTER — Inpatient Hospital Stay (HOSPITAL_COMMUNITY): Payer: Medicare Other

## 2018-06-19 ENCOUNTER — Encounter (HOSPITAL_COMMUNITY): Payer: Self-pay | Admitting: Emergency Medicine

## 2018-06-19 DIAGNOSIS — L89151 Pressure ulcer of sacral region, stage 1: Secondary | ICD-10-CM | POA: Diagnosis present

## 2018-06-19 DIAGNOSIS — L89301 Pressure ulcer of unspecified buttock, stage 1: Secondary | ICD-10-CM | POA: Diagnosis not present

## 2018-06-19 DIAGNOSIS — S72002A Fracture of unspecified part of neck of left femur, initial encounter for closed fracture: Secondary | ICD-10-CM

## 2018-06-19 DIAGNOSIS — Z79890 Hormone replacement therapy (postmenopausal): Secondary | ICD-10-CM

## 2018-06-19 DIAGNOSIS — G9341 Metabolic encephalopathy: Secondary | ICD-10-CM | POA: Diagnosis present

## 2018-06-19 DIAGNOSIS — E162 Hypoglycemia, unspecified: Secondary | ICD-10-CM | POA: Diagnosis present

## 2018-06-19 DIAGNOSIS — D61818 Other pancytopenia: Secondary | ICD-10-CM | POA: Diagnosis not present

## 2018-06-19 DIAGNOSIS — Z87440 Personal history of urinary (tract) infections: Secondary | ICD-10-CM

## 2018-06-19 DIAGNOSIS — D696 Thrombocytopenia, unspecified: Secondary | ICD-10-CM | POA: Diagnosis present

## 2018-06-19 DIAGNOSIS — D509 Iron deficiency anemia, unspecified: Secondary | ICD-10-CM | POA: Diagnosis present

## 2018-06-19 DIAGNOSIS — Z66 Do not resuscitate: Secondary | ICD-10-CM | POA: Diagnosis present

## 2018-06-19 DIAGNOSIS — G9340 Encephalopathy, unspecified: Secondary | ICD-10-CM | POA: Diagnosis not present

## 2018-06-19 DIAGNOSIS — Z8249 Family history of ischemic heart disease and other diseases of the circulatory system: Secondary | ICD-10-CM | POA: Diagnosis not present

## 2018-06-19 DIAGNOSIS — I110 Hypertensive heart disease with heart failure: Secondary | ICD-10-CM | POA: Diagnosis present

## 2018-06-19 DIAGNOSIS — R10819 Abdominal tenderness, unspecified site: Secondary | ICD-10-CM

## 2018-06-19 DIAGNOSIS — I5042 Chronic combined systolic (congestive) and diastolic (congestive) heart failure: Secondary | ICD-10-CM | POA: Diagnosis present

## 2018-06-19 DIAGNOSIS — Z681 Body mass index (BMI) 19 or less, adult: Secondary | ICD-10-CM

## 2018-06-19 DIAGNOSIS — C8307 Small cell B-cell lymphoma, spleen: Secondary | ICD-10-CM | POA: Diagnosis present

## 2018-06-19 DIAGNOSIS — I447 Left bundle-branch block, unspecified: Secondary | ICD-10-CM | POA: Diagnosis present

## 2018-06-19 DIAGNOSIS — Z20828 Contact with and (suspected) exposure to other viral communicable diseases: Secondary | ICD-10-CM | POA: Diagnosis present

## 2018-06-19 DIAGNOSIS — E039 Hypothyroidism, unspecified: Secondary | ICD-10-CM | POA: Diagnosis present

## 2018-06-19 DIAGNOSIS — L8990 Pressure ulcer of unspecified site, unspecified stage: Secondary | ICD-10-CM

## 2018-06-19 DIAGNOSIS — E860 Dehydration: Secondary | ICD-10-CM | POA: Diagnosis not present

## 2018-06-19 DIAGNOSIS — E861 Hypovolemia: Secondary | ICD-10-CM | POA: Diagnosis present

## 2018-06-19 DIAGNOSIS — R1310 Dysphagia, unspecified: Secondary | ICD-10-CM | POA: Diagnosis present

## 2018-06-19 DIAGNOSIS — R161 Splenomegaly, not elsewhere classified: Secondary | ICD-10-CM | POA: Diagnosis present

## 2018-06-19 DIAGNOSIS — R451 Restlessness and agitation: Secondary | ICD-10-CM

## 2018-06-19 DIAGNOSIS — F22 Delusional disorders: Secondary | ICD-10-CM | POA: Diagnosis not present

## 2018-06-19 DIAGNOSIS — R443 Hallucinations, unspecified: Secondary | ICD-10-CM | POA: Diagnosis present

## 2018-06-19 DIAGNOSIS — R636 Underweight: Secondary | ICD-10-CM | POA: Diagnosis present

## 2018-06-19 DIAGNOSIS — R10817 Generalized abdominal tenderness: Secondary | ICD-10-CM | POA: Diagnosis not present

## 2018-06-19 DIAGNOSIS — D649 Anemia, unspecified: Secondary | ICD-10-CM

## 2018-06-19 LAB — URINALYSIS, ROUTINE W REFLEX MICROSCOPIC
Bilirubin Urine: NEGATIVE
Glucose, UA: NEGATIVE mg/dL
Hgb urine dipstick: NEGATIVE
Ketones, ur: NEGATIVE mg/dL
Leukocytes,Ua: NEGATIVE
Nitrite: NEGATIVE
Protein, ur: 30 mg/dL — AB
Specific Gravity, Urine: 1.021 (ref 1.005–1.030)
pH: 6 (ref 5.0–8.0)

## 2018-06-19 LAB — CBC WITH DIFFERENTIAL/PLATELET
Abs Immature Granulocytes: 0.01 10*3/uL (ref 0.00–0.07)
Basophils Absolute: 0 10*3/uL (ref 0.0–0.1)
Basophils Relative: 0 %
Eosinophils Absolute: 0 10*3/uL (ref 0.0–0.5)
Eosinophils Relative: 0 %
HCT: 24.7 % — ABNORMAL LOW (ref 36.0–46.0)
Hemoglobin: 7.8 g/dL — ABNORMAL LOW (ref 12.0–15.0)
Immature Granulocytes: 0 %
Lymphocytes Relative: 54 %
Lymphs Abs: 3.6 10*3/uL (ref 0.7–4.0)
MCH: 30.6 pg (ref 26.0–34.0)
MCHC: 31.6 g/dL (ref 30.0–36.0)
MCV: 96.9 fL (ref 80.0–100.0)
Monocytes Absolute: 1.5 10*3/uL — ABNORMAL HIGH (ref 0.1–1.0)
Monocytes Relative: 22 %
Neutro Abs: 1.6 10*3/uL — ABNORMAL LOW (ref 1.7–7.7)
Neutrophils Relative %: 24 %
Platelets: 86 10*3/uL — ABNORMAL LOW (ref 150–400)
RBC: 2.55 MIL/uL — ABNORMAL LOW (ref 3.87–5.11)
RDW: 18.3 % — ABNORMAL HIGH (ref 11.5–15.5)
WBC: 6.7 10*3/uL (ref 4.0–10.5)
nRBC: 0 % (ref 0.0–0.2)

## 2018-06-19 LAB — COMPREHENSIVE METABOLIC PANEL
ALT: 14 U/L (ref 0–44)
AST: 47 U/L — ABNORMAL HIGH (ref 15–41)
Albumin: 2.5 g/dL — ABNORMAL LOW (ref 3.5–5.0)
Alkaline Phosphatase: 78 U/L (ref 38–126)
Anion gap: 6 (ref 5–15)
BUN: 13 mg/dL (ref 8–23)
CO2: 24 mmol/L (ref 22–32)
Calcium: 7.1 mg/dL — ABNORMAL LOW (ref 8.9–10.3)
Chloride: 108 mmol/L (ref 98–111)
Creatinine, Ser: 0.66 mg/dL (ref 0.44–1.00)
GFR calc Af Amer: >60 mL/min (ref >60)
GFR calc non Af Amer: >60 mL/min (ref >60)
Glucose, Bld: 85 mg/dL (ref 70–99)
Potassium: 4.9 mmol/L (ref 3.5–5.1)
Sodium: 138 mmol/L (ref 135–145)
Total Bilirubin: 0.9 mg/dL (ref 0.3–1.2)
Total Protein: 5.7 g/dL — ABNORMAL LOW (ref 6.5–8.1)

## 2018-06-19 LAB — CBG MONITORING, ED: Glucose-Capillary: 66 mg/dL — ABNORMAL LOW (ref 70–99)

## 2018-06-19 LAB — AMMONIA: Ammonia: 16 umol/L (ref 9–35)

## 2018-06-19 LAB — SARS CORONAVIRUS 2 BY RT PCR (HOSPITAL ORDER, PERFORMED IN ~~LOC~~ HOSPITAL LAB): SARS Coronavirus 2: NEGATIVE

## 2018-06-19 LAB — GLUCOSE, CAPILLARY: Glucose-Capillary: 86 mg/dL (ref 70–99)

## 2018-06-19 LAB — TSH: TSH: 12.42 u[IU]/mL — ABNORMAL HIGH (ref 0.350–4.500)

## 2018-06-19 LAB — T4, FREE: Free T4: 1.17 ng/dL (ref 0.82–1.77)

## 2018-06-19 MED ORDER — HALOPERIDOL LACTATE 5 MG/ML IJ SOLN
2.0000 mg | Freq: Once | INTRAMUSCULAR | Status: AC
  Administered 2018-06-19: 2 mg via INTRAMUSCULAR

## 2018-06-19 MED ORDER — DEXTROSE-NACL 5-0.9 % IV SOLN
INTRAVENOUS | Status: DC
  Administered 2018-06-19 – 2018-06-22 (×5): via INTRAVENOUS

## 2018-06-19 MED ORDER — HALOPERIDOL LACTATE 5 MG/ML IJ SOLN
2.0000 mg | Freq: Once | INTRAMUSCULAR | Status: AC
  Administered 2018-06-19: 2 mg via INTRAMUSCULAR
  Filled 2018-06-19: qty 1

## 2018-06-19 NOTE — H&P (Signed)
History and Physical    Kathleen Arias ZJI:[id number] DOB: 03/02/1925 DOA: 06/19/2018  PCP: [name], MD Patient coming from: [facility]  Chief Complaint: Altered mental status  HPI: Kathleen Arias is a 85 y.o. female with medical history significant of chronic combined systolic and diastolic CHF, hypertension, hypothyroidism, and conditions listed below presenting to the hospital for evaluation of altered mental status.  Per EMS report, patient arrived from home.  She has been at [facility].  While she was there, she was treated for a UTI.  Patient was discharged from that facility to home and according to EMS report she has been having hallucinations.  Patient was able to tell me her name and that she is at a hospital.  No additional history could be obtained from her as she appeared confused.   I spoke to the patient's daughter over the phone who tells me that patient had a hip fracture a month ago and was discharged to [facility].  She returned from [facility] 10 days ago and since then has been living with family.  Initially she seemed okay but 3 days later family noticed that she started hallucinating and was becoming very agitated.  It was very difficult for them to take care of her.  They consulted her primary care physician who treated her for a UTI with an antibiotic but symptoms continue to worsen.  Her primary care physician then gave her olanzapine for hallucinations which initially helped but then symptoms recurred.  Daughter states patient has been trying to climb out of bed, constantly talking throughout the day, and refusing to take her medications.  For the past day or two she has not been eating or drinking and not urinating much.  Family has not noticed any fevers or vomiting.  Daughter reports history of intermittent diarrhea, last episode was a few days ago.  Patient has been getting Synthroid 150 mcg daily.  At baseline she is oriented to person and place only.   ED  Course: Afebrile.  No leukocytosis.  Hemoglobin 7.8, baseline in the 8-9 range.  Platelet count 86,000, chronically low.  Corrected calcium 8.3.  AST borderline elevated at 47, remainder of LFTs normal.  Blood glucose 85 initially, subsequent readings 66.  COVID-19 rapid test negative.  TSH significantly elevated at 12.42.  UA not suggestive of infection.  Chest x-ray showing no active disease.  Head CT negative for acute intracranial abnormality. Received Haldol 4 mg total in the ED.   Review of Systems:  All systems reviewed and apart from history of presenting illness, are negative.  Past Medical History:  Diagnosis Date   Anemia    Anginal pain ([place])    Arthritis    Cancer ([place])    CHF (congestive heart failure) ([place])    Dyspnea    Dysrhythmia    Hypertension    Hypothyroidism    Thyroid disease     Past Surgical History:  Procedure Laterality Date   CARDIAC CATHETERIZATION N/A 01/07/2016   Procedure: Left Heart Cath and Coronary Angiography;  Surgeon: Jettie Booze, MD;  Location: [facility];  Service: Cardiovascular;  Laterality: N/A;   CATARACT EXTRACTION, BILATERAL     FRACTURE SURGERY     HIP SURGERY     JOINT REPLACEMENT     TONSILLECTOMY       reports that she has quit smoking. She has never used smokeless tobacco. She reports that she does not drink alcohol or use drugs.  No Known Allergies  Family History  Problem Relation Age of Onset   Heart disease Mother     Prior to Admission medications   Medication Sig Start Date End Date Taking? Authorizing Provider  acetaminophen (TYLENOL) 500 MG tablet Take 500 mg by mouth every 4 (four) hours as needed for mild pain or moderate pain.    [provider]  B Complex-C-Calcium (GNP B-COMPLEX PLUS VITAMIN C PO) Give one by mouth daily for supplement    [provider]  carvedilol (COREG) 6.25 MG tablet Take 0.5 tablets (3.125 mg total) by mouth 2 (two) times daily with a  meal. Patient taking differently: Take 6.25 mg by mouth daily.  03/30/18   Granville Lewis C, PA-C  iron polysaccharides (NIFEREX) 150 MG capsule Take 1 capsule (150 mg total) by mouth daily. Patient not taking: Reported on 05/09/2018 03/30/18   Wille Celeste, PA-C  levothyroxine (SYNTHROID, LEVOTHROID) 150 MCG tablet Take 1 tablet (150 mcg total) by mouth daily before breakfast. For Hypothyroidism Patient taking differently: Take 150 mcg by mouth daily before breakfast.  03/30/18   Granville Lewis C, PA-C  losartan (COZAAR) 25 MG tablet TAKE 1 TABLET(25 MG) BY MOUTH DAILY Patient taking differently: Take 25 mg by mouth daily.  05/08/18   [name], MD  Multiple Vitamins-Minerals (PRESERVISION AREDS 2) CAPS Give one by mouth daily for supplement    [provider]  torsemide (DEMADEX) 10 MG tablet Take 1 tablet (10 mg total) by mouth daily. 05/15/18   [name], MD    Physical Exam: Vitals:   06/19/18 1945 06/19/18 2047 06/19/18 2206 06/19/18 2232  BP: (!) 142/72 122/86 121/82 (!) 141/68  Pulse: 90 79 75 70  Resp: 20 18 18 16   Temp: 98.3 F (36.8 C)  98.2 F (36.8 C) 97.6 F (36.4 C)  TempSrc: Axillary  Axillary Axillary  SpO2: 95% 94% 94% 94%  Weight:    53.9 kg  Height:    5\' 7"  (1.702 m)    Physical Exam  Constitutional: She is oriented to person, place, and time.  HENT:  Head: Normocephalic.  Dry mucous membranes  Eyes: Right eye exhibits no discharge. Left eye exhibits no discharge.  Neck: Neck supple.  Cardiovascular: Normal rate, regular rhythm and intact distal pulses.  Pulmonary/Chest: Effort normal. No respiratory distress. She has no wheezes. She has no rales.  Anterior lung fields auscultated.  Examination limited due to lack of patient cooperation.  Abdominal: Soft. She exhibits distension. There is abdominal tenderness.  Hypoactive bowel sounds Abdomen appears distended and is diffusely tender to even minimal palpation.  Examination limited as  patient is pushing me away.  Musculoskeletal:        General: No edema.  Neurological: She is alert and oriented to person, place, and time.  Awake and alert Hallucinating but briefly redirectable She knows her name and that she is at a hospital. Moving all extremities spontaneously.  No focal weakness.  Skin: Skin is warm and dry. She is not diaphoretic.  Psychiatric:  Hallucinating     Labs on Admission: I have personally reviewed following labs and imaging studies  CBC: Recent Labs  Lab 06/19/18 1640  WBC 6.7  NEUTROABS 1.6*  HGB 7.8*  HCT 24.7*  MCV 96.9  PLT 86*   Basic Metabolic Panel: Recent Labs  Lab 06/19/18 1640  NA 138  K 4.9  CL 108  CO2 24  GLUCOSE 85  BUN 13  CREATININE 0.66  CALCIUM  7.1*   GFR: Estimated Creatinine Clearance: 39 mL/min (by C-G formula based on SCr of 0.66 mg/dL). Liver Function Tests: Recent Labs  Lab 06/19/18 1640  AST 47*  ALT 14  ALKPHOS 78  BILITOT 0.9  PROT 5.7*  ALBUMIN 2.5*   No results for input(s): LIPASE, AMYLASE in the last 168 hours. Recent Labs  Lab 06/19/18 2259  AMMONIA 16   Coagulation Profile: No results for input(s): INR, PROTIME in the last 168 hours. Cardiac Enzymes: No results for input(s): CKTOTAL, CKMB, CKMBINDEX, TROPONINI in the last 168 hours. BNP (last 3 results) No results for input(s): PROBNP in the last 8760 hours. HbA1C: No results for input(s): HGBA1C in the last 72 hours. CBG: Recent Labs  Lab 06/19/18 1756 06/19/18 2249 06/20/18 0012  GLUCAP 66* 86 90   Lipid Profile: No results for input(s): CHOL, HDL, LDLCALC, TRIG, CHOLHDL, LDLDIRECT in the last 72 hours. Thyroid Function Tests: Recent Labs    06/19/18 1844  TSH 12.420*  FREET4 1.17   Anemia Panel: Recent Labs    06/19/18 2259  VITAMINB12 361   Urine analysis:    Component Value Date/Time   COLORURINE AMBER (A) 06/19/2018 1652   APPEARANCEUR HAZY (A) 06/19/2018 1652   LABSPEC 1.021 06/19/2018 1652    PHURINE 6.0 06/19/2018 1652   GLUCOSEU NEGATIVE 06/19/2018 1652   HGBUR NEGATIVE 06/19/2018 1652   BILIRUBINUR NEGATIVE 06/19/2018 1652   KETONESUR NEGATIVE 06/19/2018 1652   PROTEINUR 30 (A) 06/19/2018 1652   UROBILINOGEN 0.2 08/21/2010 1229   NITRITE NEGATIVE 06/19/2018 1652   LEUKOCYTESUR NEGATIVE 06/19/2018 1652    Radiological Exams on Admission: Ct Abdomen Pelvis Wo Contrast  Result Date: 06/19/2018 CLINICAL DATA:  Bowel obstruction. EXAM: CT ABDOMEN AND PELVIS WITHOUT CONTRAST TECHNIQUE: Multidetector CT imaging of the abdomen and pelvis was performed following the standard protocol without IV contrast. COMPARISON:  None. FINDINGS: Lower chest: The heart size is enlarged. The intracardiac blood pool is hypodense relative to the adjacent myocardium consistent with anemia. There are trace bilateral pleural effusions, right greater than left. Hepatobiliary: Large gallstones are noted. The liver is unremarkable. Pancreas: The pancreas is suboptimally evaluated secondary to lack of IV contrast. The visualized portions appear unremarkable. Spleen: The spleen is significantly enlarged measuring approximately 22.4 cm craniocaudad. This is significantly increased from prior study when it measured approximately 18 cm craniocaudad. There are small 1.3 cm hypoattenuating nodules that are suboptimally evaluated on this exam. Adrenals/Urinary Tract: Adrenal glands are unremarkable. Kidneys are normal, without renal calculi, focal lesion, or hydronephrosis. Bladder is unremarkable. Stomach/Bowel: There is a large amount of stool throughout the colon. There is scattered colonic diverticula there is no CT evidence of diverticulitis or colitis. There is no evidence of a small-bowel obstruction. The appendix is not reliably identified. Vascular/Lymphatic: Aortic atherosclerosis. No enlarged abdominal or pelvic lymph nodes. Reproductive: Status post hysterectomy. No adnexal masses. Other: There is a small amount of  free fluid in the abdomen and pelvis. This has increased from prior study. There is diffuse body wall edema. There is a small amount of free fluid in the right inguinal canal. Musculoskeletal: The patient is status post prior left hip arthroplasty. There is stable height loss of the T11 vertebral body. Again identified is a periprosthetic fracture of the proximal left hip. IMPRESSION: 1. Very limited study secondary to motion artifact and lack of IV contrast. 2. No evidence of a small-bowel obstruction. 3. Significant interval increase in size of the spleen as detailed above. 4. Interval  development of small volume ascites. 5. Cardiomegaly with small bilateral pleural effusions. There is anemia. 6. Anasarca. 7. Again noted is a periprosthetic fracture involving the proximal left femur, better visualized on prior CT from 05/09/2018. Electronically Signed   By: Constance Holster M.D.   On: 06/19/2018 22:32   Ct Head Wo Contrast  Result Date: 06/19/2018 CLINICAL DATA:  Altered mental status.  Patient has severe dementia. EXAM: CT HEAD WITHOUT CONTRAST TECHNIQUE: Contiguous axial images were obtained from the base of the skull through the vertex without intravenous contrast. COMPARISON:  Head CT 08/21/2010 FINDINGS: Brain: Progressive age related cerebral atrophy, ventriculomegaly and periventricular white matter disease. No extra-axial fluid collections are identified. No CT findings for acute hemispheric infarction or intracranial hemorrhage. No mass lesions. The brainstem and cerebellum are normal. Vascular: Vascular calcifications but no definite aneurysm or hyperdense vessels. Skull: No skull fracture or bone lesions. Sinuses/Orbits: The paranasal sinuses and mastoid air cells are clear except for a small mucous retention cyst or polyp in left maxillary sinus. The globes are intact. Other: No scalp lesions or hematoma. IMPRESSION: 1. Progressive age related cerebral atrophy, ventriculomegaly and periventricular  white matter disease. 2. No acute intracranial findings or mass lesions. Electronically Signed   By: Marijo Sanes M.D.   On: 06/19/2018 18:00   Dg Chest Portable 1 View  Result Date: 06/19/2018 CLINICAL DATA:  Cough and confusion EXAM: PORTABLE CHEST 1 VIEW COMPARISON:  Chest x-ray dated 05/09/2018 FINDINGS: The cardiac silhouette is enlarged. There is mild vascular congestion without overt pulmonary edema. The lung volumes are somewhat low. Aortic calcifications are noted. There is no acute osseous abnormality. IMPRESSION: No active disease. Electronically Signed   By: Constance Holster M.D.   On: 06/19/2018 18:58    EKG: Independently reviewed.  Sinus rhythm.  PVCs new since prior tracing.  Assessment/Plan Principal Problem:   Acute encephalopathy Active Problems:   Anemia, iron deficiency   Splenic marginal zone b-cell lymphoma (HCC)   Hypothyroidism   Abdominal tenderness   Acute metabolic encephalopathy Family reports 1 week history of hallucinations, agitation, and restlessness. Infectious etiology less likely as patient is afebrile and does not have leukocytosis.  Chest x-ray not suggestive of pneumonia.  UA not suggestive of infection.  COVID-19 rapid test negative.  Head CT negative for acute intracranial abnormality.  No focal neuro deficit.  TSH significantly elevated at 12.42, however, free T4 normal.  TSH has previously been as high as 50 and currently improved in comparison to prior labs.  B12 and ammonia levels normal.  Patient has not been eating much and blood glucose as low as 66 in the ED.  Dehydration and hypoglycemia could be contributing to her AMS.  -Gentle IV fluid hydration with normal saline-D5 infusion -Monitor CBG every 2 hours -Received a total of 4 mg Haldol in the ED. continue to monitor for mental status changes. -Delirium precautions -Fall precautions  Abdominal tenderness On exam, noted to have hypoactive bowel sounds, abdominal distention, and tenderness  even on minimal palpation.  CT abdomen pelvis (limited study) without evidence of SBO.  CT does show worsening splenomegaly and small volume ascites.  Patient has a history of splenic marginal B-cell lymphoma. -Consult hematology in a.m.  Splenic marginal B-cell lymphoma Followed by hematology.  CT showing worsening splenomegaly. -Consult hematology in a.m.  History of diarrhea Family reports history of intermittent diarrhea, last episode a few days ago.  Was recently treated for a UTI.  Patient has not had any diarrhea  in the hospital. -Continue to monitor.  Consider testing for C. difficile if there is recurrence of diarrhea.  Chronic normocytic anemia secondary to iron deficiency and underlying non-Hodgkin's lymphoma -Hemoglobin 7.8, baseline in the 8-9 range.  No signs of active bleeding. -Continue to monitor  Chronic thrombocytopenia -Platelet count 86,000, chronically low.  No signs of active bleeding. -Continue to monitor  Physical deconditioning -PT evaluation  Chronic combined systolic and diastolic congestive heart failure Last echo from May 2019 with EF 20 to 25% and grade 2 diastolic dysfunction.  Imaging with mild vascular congestion and small bilateral pleural effusions.  No overt pulmonary edema. -Continue home Coreg and losartan -Hold torsemide at this time  Hypertension -Continue home Coreg and losartan  Hypothyroidism -Continue home Synthroid 150 mcg daily  DVT prophylaxis: SCDs in the setting of thrombocytopenia Code Status: DNR.  Discussed with the patient's daughter on the phone. Family Communication: Patient's daughter updated on the phone. Disposition Plan: Anticipate discharge to SNF after clinical improvement. Consults called: None Admission status: It is my clinical opinion that admission to INPATIENT is reasonable and necessary in this 85 y.o. female  presenting with symptoms of hallucinations, agitation, restlessness, concerning for acute  encephalopathy  in the context of PMH including: Hypertension, hypothyroidism, hip fracture a month ago  with pertinent positives on physical exam including: Confusion, agitation, restlessness  and pertinent positives on radiographic and laboratory data including: Hypoglycemia  Workup and treatment include IV fluid hydration, dextrose supplementation, monitoring mental status, PT evaluation and likely SNF placement.  Given the aforementioned, the predictability of an adverse outcome is felt to be significant. I expect that the patient will require at least 2 midnights in the hospital to treat this condition.   The medical decision making on this patient was of high complexity and the patient is at high risk for clinical deterioration, therefore this is a level 3 visit.  Shela Leff MD [facility] Pager [phone]  If 7PM-7AM, please contact night-coverage [email or web address] Password [email or web address]  06/20/2018, 2:44 AM

## 2018-06-19 NOTE — ED Notes (Signed)
Pt still agitated and violent towards staff. Pt placed in safety mitts and is tolerating well. NAD

## 2018-06-19 NOTE — ED Notes (Addendum)
Patient has become combative. Patient is difficult to console. Patient is talking to people who she claims are in the room. This Probation officer and Elmo Putt NT has attempted to feed patient. This patient claims she would like to eat and then refused to accept food from staff.

## 2018-06-19 NOTE — ED Triage Notes (Signed)
Patient arrived by EMS from home. Pt has been having hallucinations. Kathleen Arias told family that she had a UTI 9 days ago before being discharged out of facility.   Alert and oriented x 4 per EMS. Pt is having visual hallucinations.   Pt has LFT Femur fracture that happened April 22nd per EMS.   Family is interested in getting patient back to facility, patient used to be at [facility].  EMS VS BP 129/54, HR 84, CBG 99, SpO2 100% on 2L [place], RR 28.

## 2018-06-19 NOTE — ED Notes (Signed)
ED TO INPATIENT HANDOFF REPORT  ED Nurse Name and Phone #: Tomi Likens, RN  S Name/Age/Gender Kathleen Arias 85 y.o. female Room/Bed: WA23/WA23  Code Status   Code Status: DNR  Home/SNF/Other Home Patient oriented to: self Is this baseline? No   Triage Complete: Triage complete  Chief Complaint possible uti  Triage Note Patient arrived by EMS from home. Pt has been having hallucinations. [name] told family that she had a UTI 9 days ago before being discharged out of facility.   Alert and oriented x 4 per EMS. Pt is having visual hallucinations.   Pt has LFT Femur fracture that happened April 22nd per EMS.   Family is interested in getting patient back to facility, patient used to be at [facility]. EMS VS BP 129/54, HR 84, CBG 99, SpO2 100% on 2L [place], RR 28.     Allergies No Known Allergies  Level of Care/Admitting Diagnosis ED Disposition    ED Disposition Condition Comment   Admit  Hospital Area: [facility] [[id number]]  Level of Care: Med-Surg [16]  Covid Evaluation: Confirmed COVID Negative  Diagnosis: Acute encephalopathy [[id number]]  Admitting Physician: Shela Leff [[id number]]  Attending Physician: Shela Leff [[id number]]  Estimated length of stay: past midnight tomorrow  Certification:: I certify this patient will need inpatient services for at least 2 midnights  PT Class (Do Not Modify): Inpatient [101]  PT Acc Code (Do Not Modify): Private [1]       B Medical/Surgery History Past Medical History:  Diagnosis Date  . Anemia   . Anginal pain ([place])   . Arthritis   . Cancer ([place])   . CHF (congestive heart failure) ([place])   . Dyspnea   . Dysrhythmia   . Hypertension   . Hypothyroidism   . Thyroid disease    Past Surgical History:  Procedure Laterality Date  . CARDIAC CATHETERIZATION N/A 01/07/2016   Procedure: Left Heart Cath and Coronary Angiography;  Surgeon: Jettie Booze, MD;  Location: [facility];  Service:  Cardiovascular;  Laterality: N/A;  . CATARACT EXTRACTION, BILATERAL    . FRACTURE SURGERY    . HIP SURGERY    . JOINT REPLACEMENT    . TONSILLECTOMY       A IV Location/Drains/Wounds Patient Lines/Drains/Airways Status   Active Line/Drains/Airways    Name:   Placement date:   Placement time:   Site:   Days:   Peripheral IV 06/19/18 Right Forearm   06/19/18    1651    Forearm   less than 1   External Urinary Catheter   05/14/18    0758    -   36          Intake/Output Last 24 hours No intake or output data in the 24 hours ending 06/19/18 2202  Labs/Imaging Results for orders placed or performed during the hospital encounter of 06/19/18 (from the past 48 hour(s))  Comprehensive metabolic panel     Status: Abnormal   Collection Time: 06/19/18  4:40 PM  Result Value Ref Range   Sodium 138 135 - 145 mmol/L   Potassium 4.9 3.5 - 5.1 mmol/L   Chloride 108 98 - 111 mmol/L   CO2 24 22 - 32 mmol/L   Glucose, Bld 85 70 - 99 mg/dL   BUN 13 8 - 23 mg/dL   Creatinine, Ser 0.66 0.44 - 1.00 mg/dL   Calcium 7.1 (L) 8.9 - 10.3 mg/dL   Total Protein 5.7 (L) 6.5 - 8.1 g/dL  Albumin 2.5 (L) 3.5 - 5.0 g/dL   AST 47 (H) 15 - 41 U/L   ALT 14 0 - 44 U/L   Alkaline Phosphatase 78 38 - 126 U/L   Total Bilirubin 0.9 0.3 - 1.2 mg/dL   GFR calc non Af Amer >60 >60 mL/min   GFR calc Af Amer >60 >60 mL/min   Anion gap 6 5 - 15    Comment: Performed at [facility], [place] [address], [place], [place] [address]  CBC WITH DIFFERENTIAL     Status: Abnormal   Collection Time: 06/19/18  4:40 PM  Result Value Ref Range   WBC 6.7 4.0 - 10.5 K/uL    Comment: REPEATED TO VERIFY WHITE COUNT CONFIRMED ON SMEAR    RBC 2.55 (L) 3.87 - 5.11 MIL/uL   Hemoglobin 7.8 (L) 12.0 - 15.0 g/dL   HCT 24.7 (L) 36.0 - 46.0 %   MCV 96.9 80.0 - 100.0 fL   MCH 30.6 26.0 - 34.0 pg   MCHC 31.6 30.0 - 36.0 g/dL   RDW 18.3 (H) 11.5 - 15.5 %   Platelets 86 (L) 150 - 400 K/uL    Comment: REPEATED TO  VERIFY PLATELET COUNT CONFIRMED BY SMEAR SPECIMEN CHECKED FOR CLOTS Immature Platelet Fraction may be clinically indicated, consider ordering this additional test [id number]    nRBC 0.0 0.0 - 0.2 %   Neutrophils Relative % 24 %   Neutro Abs 1.6 (L) 1.7 - 7.7 K/uL   Lymphocytes Relative 54 %   Lymphs Abs 3.6 0.7 - 4.0 K/uL   Monocytes Relative 22 %   Monocytes Absolute 1.5 (H) 0.1 - 1.0 K/uL   Eosinophils Relative 0 %   Eosinophils Absolute 0.0 0.0 - 0.5 K/uL   Basophils Relative 0 %   Basophils Absolute 0.0 0.0 - 0.1 K/uL   WBC Morphology ABSOLUTE LYMPHOCYTOSIS    Immature Granulocytes 0 %   Abs Immature Granulocytes 0.01 0.00 - 0.07 K/uL    Comment: Performed at [facility], [place] [address] [place], [place] [address]  Urinalysis, Routine w reflex microscopic     Status: Abnormal   Collection Time: 06/19/18  4:52 PM  Result Value Ref Range   Color, Urine AMBER (A) YELLOW    Comment: BIOCHEMICALS MAY BE AFFECTED BY COLOR   APPearance HAZY (A) CLEAR   Specific Gravity, Urine 1.021 1.005 - 1.030   pH 6.0 5.0 - 8.0   Glucose, UA NEGATIVE NEGATIVE mg/dL   Hgb urine dipstick NEGATIVE NEGATIVE   Bilirubin Urine NEGATIVE NEGATIVE   Ketones, ur NEGATIVE NEGATIVE mg/dL   Protein, ur 30 (A) NEGATIVE mg/dL   Nitrite NEGATIVE NEGATIVE   Leukocytes,Ua NEGATIVE NEGATIVE   RBC / HPF 0-5 0 - 5 RBC/hpf   WBC, UA 0-5 0 - 5 WBC/hpf   Bacteria, UA RARE (A) NONE SEEN   Squamous Epithelial / LPF 0-5 0 - 5    Comment: Performed at [facility], [place] [address], [place], [place] [address]  CBG monitoring, ED     Status: Abnormal   Collection Time: 06/19/18  5:56 PM  Result Value Ref Range   Glucose-Capillary 66 (L) 70 - 99 mg/dL  SARS Coronavirus 2 (CEPHEID - Performed in [facility] hospital lab), Hosp Order     Status: None   Collection Time: 06/19/18  6:09 PM  Result Value Ref Range   SARS Coronavirus 2 NEGATIVE NEGATIVE    Comment: (NOTE) If result  is  NEGATIVE SARS-CoV-2 target nucleic acids are NOT DETECTED. The SARS-CoV-2 RNA is generally detectable in upper and lower  respiratory specimens during the acute phase of infection. The lowest  concentration of SARS-CoV-2 viral copies this assay can detect is 250  copies / mL. A negative result does not preclude SARS-CoV-2 infection  and should not be used as the sole basis for treatment or other  patient management decisions.  A negative result may occur with  improper specimen collection / handling, submission of specimen other  than nasopharyngeal swab, presence of viral mutation(s) within the  areas targeted by this assay, and inadequate number of viral copies  (<250 copies / mL). A negative result must be combined with clinical  observations, patient history, and epidemiological information. If result is POSITIVE SARS-CoV-2 target nucleic acids are DETECTED. The SARS-CoV-2 RNA is generally detectable in upper and lower  respiratory specimens dur ing the acute phase of infection.  Positive  results are indicative of active infection with SARS-CoV-2.  Clinical  correlation with patient history and other diagnostic information is  necessary to determine patient infection status.  Positive results do  not rule out bacterial infection or co-infection with other viruses. If result is PRESUMPTIVE POSTIVE SARS-CoV-2 nucleic acids MAY BE PRESENT.   A presumptive positive result was obtained on the submitted specimen  and confirmed on repeat testing.  While 2019 novel coronavirus  (SARS-CoV-2) nucleic acids may be present in the submitted sample  additional confirmatory testing may be necessary for epidemiological  and / or clinical management purposes  to differentiate between  SARS-CoV-2 and other Sarbecovirus currently known to infect humans.  If clinically indicated additional testing with an alternate test  methodology [phone]) is advised. The SARS-CoV-2 RNA is generally   detectable in upper and lower respiratory sp ecimens during the acute  phase of infection. The expected result is Negative. Fact Sheet for Patients:  StrictlyIdeas.no Fact Sheet for Healthcare Providers: BankingDealers.co.za This test is not yet approved or cleared by the [place] FDA and has been authorized for detection and/or diagnosis of SARS-CoV-2 by FDA under an Emergency Use Authorization (EUA).  This EUA will remain in effect (meaning this test can be used) for the duration of the COVID-19 declaration under Section 564(b)(1) of the Act, 21 U.S.C. section 360bbb-3(b)(1), unless the authorization is terminated or revoked sooner. Performed at [facility], [place] [address], [place], [place] [address]   TSH     Status: Abnormal   Collection Time: 06/19/18  6:44 PM  Result Value Ref Range   TSH 12.420 (H) 0.350 - 4.500 uIU/mL    Comment: Performed by a 3rd Generation assay with a functional sensitivity of <=0.01 uIU/mL. Performed at [facility], [place] [address], [place], [place] [address]   T4, free     Status: None   Collection Time: 06/19/18  6:44 PM  Result Value Ref Range   Free T4 1.17 0.82 - 1.77 ng/dL    Comment: (NOTE) Biotin ingestion may interfere with free T4 tests. If the results are inconsistent with the TSH level, previous test results, or the clinical presentation, then consider biotin interference. If needed, order repeat testing after stopping biotin. Performed at [facility], [place] [address], [place], [place] [address]    Ct Head Wo Contrast  Result Date: 06/19/2018 CLINICAL DATA:  Altered mental status.  Patient has severe dementia. EXAM: CT HEAD WITHOUT CONTRAST TECHNIQUE: Contiguous axial images were obtained from the base of  the skull through the vertex without intravenous contrast. COMPARISON:  Head CT 08/21/2010 FINDINGS: Brain: Progressive age related  cerebral atrophy, ventriculomegaly and periventricular white matter disease. No extra-axial fluid collections are identified. No CT findings for acute hemispheric infarction or intracranial hemorrhage. No mass lesions. The brainstem and cerebellum are normal. Vascular: Vascular calcifications but no definite aneurysm or hyperdense vessels. Skull: No skull fracture or bone lesions. Sinuses/Orbits: The paranasal sinuses and mastoid air cells are clear except for a small mucous retention cyst or polyp in left maxillary sinus. The globes are intact. Other: No scalp lesions or hematoma. IMPRESSION: 1. Progressive age related cerebral atrophy, ventriculomegaly and periventricular white matter disease. 2. No acute intracranial findings or mass lesions. Electronically Signed   By: Marijo Sanes M.D.   On: 06/19/2018 18:00   Dg Chest Portable 1 View  Result Date: 06/19/2018 CLINICAL DATA:  Cough and confusion EXAM: PORTABLE CHEST 1 VIEW COMPARISON:  Chest x-ray dated 05/09/2018 FINDINGS: The cardiac silhouette is enlarged. There is mild vascular congestion without overt pulmonary edema. The lung volumes are somewhat low. Aortic calcifications are noted. There is no acute osseous abnormality. IMPRESSION: No active disease. Electronically Signed   By: Constance Holster M.D.   On: 06/19/2018 18:58    Pending Labs Unresulted Labs (From admission, onward)    Start     Ordered   06/19/18 2147  Ammonia  Once,   R     06/19/18 2148   06/19/18 2146  Vitamin B12  Once,   R     06/19/18 2148          Vitals/Pain Today's Vitals   06/19/18 1500 06/19/18 1821 06/19/18 1945 06/19/18 2047  BP: (!) 116/59 (!) 145/59 (!) 142/72 122/86  Pulse: 74 86 90 79  Resp: (!) 23 20 20 18   Temp:   98.3 F (36.8 C)   TempSrc:   Axillary   SpO2: 93% (!) 86% 95% 94%  Weight:      Height:      PainSc:        Isolation Precautions No active isolations  Medications Medications  dextrose 5 %-0.9 % sodium chloride infusion  (has no administration in time range)  haloperidol lactate (HALDOL) injection 2 mg (2 mg Intramuscular Given 06/19/18 1955)  haloperidol lactate (HALDOL) injection 2 mg (2 mg Intramuscular Given 06/19/18 2120)    Mobility non-ambulatory High fall risk   Focused Assessments    R Recommendations: See Admitting Provider Note  Report given to:   Additional Notes:

## 2018-06-19 NOTE — ED Provider Notes (Signed)
[facility] DEPT Provider Note   CSN: [id number] Arrival date & time: 06/19/18  1409    History   Chief Complaint Chief Complaint  Patient presents with  . Hallucinations  . Urinary Tract Infection    HPI Kathleen Arias is a 85 y.o. female.     HPI Patient presented to the emergency room for evaluation of altered mental status.  According to the EMS report the patient arrived from home.  She had been at [facility].  While she was there apparently she was treated for urinary tract infection.  Patient was discharged from that facility to home and according to theEMS report patient has been having hallucinations.  Patient here in the ED denies any complaints.  She is upset that she is here in the emergency room.  She tells me she did asked to be here.  She states she wants to go home in the next 30 minutes.  Patient however does appear confused and seems to be talking to people that are not in the room.  Additional information was obtained from electronic medical record.  Patient had a recent telehealth visit with her doctor on May 28.  At that time they noted that the patient is requiring 24-hour supervision at home.  2 people are needed to help change her diaper.  Patient cannot sit up in bed.  Patient has become agitated and combative.  She is having worsening hallucinations and confusion.  Patient's doctor started her on Zyprexa.  She was also started on Cipro empirically. Past Medical History:  Diagnosis Date  . Anemia   . Anginal pain ([place])   . Arthritis   . Cancer ([place])   . CHF (congestive heart failure) ([place])   . Dyspnea   . Dysrhythmia   . Hypertension   . Hypothyroidism   . Thyroid disease     Patient Active Problem List   Diagnosis Date Noted  . Chronic HFrEF (heart failure with reduced ejection fraction) ([place]) 05/13/2018  . Peri-prosthetic intertrochanteric fracture of femur   . Closed left hip fracture ([place]) 05/09/2018  .  Weakness 03/04/2018  . UTI (urinary tract infection) 03/03/2018  . NICM (nonischemic cardiomyopathy) ([place]) 03/02/2018  . Hypothyroidism 03/02/2018  . Generalized weakness 05/19/2017  . Edema of left lower extremity 02/21/2017  . Splenic marginal zone b-cell lymphoma ([place]) 06/20/2016  . Counseling regarding advanced care planning and goals of care 05/21/2016  . Iron deficiency anemia 05/16/2016  . Anemia, iron deficiency 01/07/2016  . Normocytic anemia 01/07/2016  . Abnormal stress test   . Chest pain 01/05/2016  . Shortness of breath   . Localized swelling of lower extremity   . Hyponatremia   . Hyperglycemia   . Musculoskeletal pain     Past Surgical History:  Procedure Laterality Date  . CARDIAC CATHETERIZATION N/A 01/07/2016   Procedure: Left Heart Cath and Coronary Angiography;  Surgeon: Jettie Booze, MD;  Location: [facility];  Service: Cardiovascular;  Laterality: N/A;  . CATARACT EXTRACTION, BILATERAL    . FRACTURE SURGERY    . HIP SURGERY    . JOINT REPLACEMENT    . TONSILLECTOMY       OB History   No obstetric history on file.      Home Medications    Prior to Admission medications   Medication Sig Start Date End Date Taking? Authorizing Provider  acetaminophen (TYLENOL) 500 MG tablet Take 500 mg by mouth every 4 (four) hours as needed  for mild pain or moderate pain.    [provider]  B Complex-C-Calcium (GNP B-COMPLEX PLUS VITAMIN C PO) Give one by mouth daily for supplement    [provider]  carvedilol (COREG) 6.25 MG tablet Take 0.5 tablets (3.125 mg total) by mouth 2 (two) times daily with a meal. Patient taking differently: Take 6.25 mg by mouth daily.  03/30/18   [place] Lewis C, PA-C  iron polysaccharides (NIFEREX) 150 MG capsule Take 1 capsule (150 mg total) by mouth daily. Patient not taking: Reported on 05/09/2018 03/30/18   Wille Celeste, PA-C  levothyroxine (SYNTHROID, LEVOTHROID) 150 MCG tablet Take 1 tablet (150  mcg total) by mouth daily before breakfast. For Hypothyroidism Patient taking differently: Take 150 mcg by mouth daily before breakfast.  03/30/18   [place] Lewis C, PA-C  losartan (COZAAR) 25 MG tablet TAKE 1 TABLET(25 MG) BY MOUTH DAILY Patient taking differently: Take 25 mg by mouth daily.  05/08/18   [name], MD  Multiple Vitamins-Minerals (PRESERVISION AREDS 2) CAPS Give one by mouth daily for supplement    [provider]  torsemide (DEMADEX) 10 MG tablet Take 1 tablet (10 mg total) by mouth daily. 05/15/18   MasoudiDorthula Rue, MD    Family History Family History  Problem Relation Age of Onset  . Heart disease Mother     Social History Social History   Tobacco Use  . Smoking status: Former Research scientist (life sciences)  . Smokeless tobacco: Never Used  Substance Use Topics  . Alcohol use: No  . Drug use: No     Allergies   Patient has no known allergies.   Review of Systems Review of Systems  All other systems reviewed and are negative.    Physical Exam Updated Vital Signs BP (!) 125/53 (BP Location: Right Arm)   Pulse 82   Temp 98.1 F (36.7 C) (Oral)   Resp (!) 25   Ht 1.6 m (5\' 3" )   Wt 51.3 kg   SpO2 94%   BMI 20.02 kg/m   Physical Exam Vitals signs and nursing note reviewed.  Constitutional:      General: She is not in acute distress.    Appearance: She is well-developed.     Comments: Elderly frail   HENT:     Head: Normocephalic and atraumatic.     Right Ear: External ear normal.     Left Ear: External ear normal.  Eyes:     General: No scleral icterus.       Right eye: No discharge.        Left eye: No discharge.     Conjunctiva/sclera: Conjunctivae normal.  Neck:     Musculoskeletal: Neck supple.     Trachea: No tracheal deviation.  Cardiovascular:     Rate and Rhythm: Normal rate and regular rhythm.  Pulmonary:     Effort: Pulmonary effort is normal. No respiratory distress.     Breath sounds: Normal breath sounds. No stridor. No  wheezing or rales.  Abdominal:     General: Bowel sounds are normal. There is no distension.     Palpations: Abdomen is soft.     Tenderness: There is no abdominal tenderness. There is no guarding or rebound.  Musculoskeletal:        General: No tenderness.  Skin:    General: Skin is warm and dry.     Findings: No rash.  Neurological:     Mental Status: She is alert.     Cranial  Nerves: No cranial nerve deficit (no facial droop, extraocular movements intact, no slurred speech).     Sensory: No sensory deficit.     Motor: No abnormal muscle tone or seizure activity.     Coordination: Coordination normal.     Comments: Pt moves all extremities, confused, appears to be talking to people that are not in the room  Psychiatric:        Mood and Affect: Affect is angry.        Speech: Speech is tangential.        Behavior: Behavior is agitated.      ED Treatments / Results  Labs (all labs ordered are listed, but only abnormal results are displayed) Labs Reviewed  SARS CORONAVIRUS 2 ([facility])  COMPREHENSIVE METABOLIC PANEL  CBC WITH DIFFERENTIAL/PLATELET  URINALYSIS, ROUTINE W REFLEX MICROSCOPIC  CBG MONITORING, ED    EKG EKG Interpretation  Date/Time:  Tuesday June 19 2018 14:46:38 EDT Ventricular Rate:  78 PR Interval:    QRS Duration: 145 QT Interval:  455 QTC Calculation: 519 R Axis:   -104 Text Interpretation:  Sinus rhythm Multiform ventricular premature complexes Nonspecific IVCD with LAD Anterior infarct, old No significant change since last tracing Confirmed by Dorie Rank [phone]) on 06/19/2018 4:46:20 PM   Radiology No results found.  Procedures Procedures (including critical care time)  Medications Ordered in ED Medications - No data to display   Initial Impression / Assessment and Plan / ED Course  I have reviewed the triage vital signs and the nursing notes.  Pertinent labs & imaging results that were  available during my care of the patient were reviewed by me and considered in my medical decision making (see chart for details).  Clinical Course as of Jun 22 711  Tue Jun 19, 2018  1837 Stable anemia.   UA without uti.  CMET normal.   [JK]  1837 CT without acute findings   [JK]  2011 Patient remains very agitated and combative.   sedative meds required   [JK]  2012 TSH elevated.  Does appear to be declining compared to previous values.   [JK]  2012 TSH(!): 12.420 [JK]    Clinical Course User Index [JK] Dorie Rank, MD     Pt presented with worsening confusion, agitation.  ED workup notable for persistent TSH elevation however not a new finding.  CT without acute finding.  No signs of acute infection.   Pt has been aggressive and difficult in the ED> Pt is being cared for at home by daughters.  Will admit for stabilization, adjust meds to see if we can control her agitation.  Consider psych consult.  I discussed history with daughter and attempted to call her back letting her know of the disp plan  Final Clinical Impressions(s) / ED Diagnoses   Final diagnoses:  Delusion ([place])  Agitation  Anemia, unspecified type  Hypothyroidism, unspecified type      Dorie Rank, MD 06/22/18 [phone]

## 2018-06-19 NOTE — ED Notes (Signed)
Patient transported to CT 

## 2018-06-19 NOTE — ED Notes (Signed)
Pt in bed swinging purewick around covered in feces yelling "get out of my room demons!" Pt verbally deescalated and cleaned up. VSS. Pt became aggressive and tried to claw at staff while saying "If you come near me I'm going to shoot you with a gun, and that's a threat" Provider notified and orders placed.

## 2018-06-20 DIAGNOSIS — D509 Iron deficiency anemia, unspecified: Secondary | ICD-10-CM

## 2018-06-20 DIAGNOSIS — E039 Hypothyroidism, unspecified: Secondary | ICD-10-CM

## 2018-06-20 DIAGNOSIS — R10819 Abdominal tenderness, unspecified site: Secondary | ICD-10-CM

## 2018-06-20 LAB — CBC
HCT: 26.6 % — ABNORMAL LOW (ref 36.0–46.0)
Hemoglobin: 8.2 g/dL — ABNORMAL LOW (ref 12.0–15.0)
MCH: 31.1 pg (ref 26.0–34.0)
MCHC: 30.8 g/dL (ref 30.0–36.0)
MCV: 100.8 fL — ABNORMAL HIGH (ref 80.0–100.0)
Platelets: 87 10*3/uL — ABNORMAL LOW (ref 150–400)
RBC: 2.64 MIL/uL — ABNORMAL LOW (ref 3.87–5.11)
RDW: 18.6 % — ABNORMAL HIGH (ref 11.5–15.5)
WBC: 6.2 10*3/uL (ref 4.0–10.5)
nRBC: 0 % (ref 0.0–0.2)

## 2018-06-20 LAB — GLUCOSE, CAPILLARY
Glucose-Capillary: 105 mg/dL — ABNORMAL HIGH (ref 70–99)
Glucose-Capillary: 77 mg/dL (ref 70–99)
Glucose-Capillary: 84 mg/dL (ref 70–99)
Glucose-Capillary: 85 mg/dL (ref 70–99)
Glucose-Capillary: 90 mg/dL (ref 70–99)
Glucose-Capillary: 91 mg/dL (ref 70–99)

## 2018-06-20 LAB — VITAMIN B12: Vitamin B-12: 361 pg/mL (ref 180–914)

## 2018-06-20 MED ORDER — LEVOTHYROXINE SODIUM 75 MCG PO TABS
150.0000 ug | ORAL_TABLET | Freq: Every day | ORAL | Status: DC
  Administered 2018-06-20 – 2018-06-23 (×4): 150 ug via ORAL
  Filled 2018-06-20 (×4): qty 2

## 2018-06-20 MED ORDER — CARVEDILOL 3.125 MG PO TABS
3.1250 mg | ORAL_TABLET | Freq: Two times a day (BID) | ORAL | Status: DC
  Administered 2018-06-20 – 2018-06-23 (×7): 3.125 mg via ORAL
  Filled 2018-06-20 (×7): qty 1

## 2018-06-20 MED ORDER — LOSARTAN POTASSIUM 25 MG PO TABS
25.0000 mg | ORAL_TABLET | Freq: Every day | ORAL | Status: DC
  Administered 2018-06-20 – 2018-06-23 (×4): 25 mg via ORAL
  Filled 2018-06-20 (×4): qty 1

## 2018-06-20 MED ORDER — ORAL CARE MOUTH RINSE
15.0000 mL | Freq: Two times a day (BID) | OROMUCOSAL | Status: DC
  Administered 2018-06-20 – 2018-06-22 (×5): 15 mL via OROMUCOSAL

## 2018-06-20 NOTE — Progress Notes (Addendum)
Pt has only voided approximately 150 cc's of dark amber urine since 7am. Urine noted to be concentrated with lots of sediment. Dr. Cathlean Sauer paged and made aware. Will continue to monitor.

## 2018-06-20 NOTE — Progress Notes (Addendum)
PROGRESS NOTE    Kathleen Arias  QMG:[id number] DOB: 06/02/1925 DOA: 06/19/2018 PCP: [name], MD    Brief Narrative:  85 year old female who presented with altered mental status.  She does have the significant past medical history for systolic heart failure, hypertension and hypothyroidism.  Patient had a recent hip fracture about a month ago that required skilled nursing facility.  She was posteriorly discharged home where she was diagnosed with urinary tract infection, treated with antibiotic therapy.  She was noted to have hallucinations and confusions, her primary care provider prescribed olanzapine with mild improvement of her symptoms.  For the last 2 days her symptoms have been worsening, patient more confused and disoriented, poor oral intake and decreased urine output.  On her initial physical examination blood pressure 142/72, heart rate 90, respiratory rate 20, temperature 98.3, oxygen saturation 94%, she had dry mucous membranes, her lungs had no significant rhonchi or wheezing, his abdomen was distended and tender to palpation, hypoactive bowel sounds, no lower extremity edema. Patient was awake and alert, but confused and disoriented.  Nonfocal.  Patient was admitted to the hospital working diagnosis of metabolic encephalopathy.  Assessment & Plan:   Principal Problem:   Acute encephalopathy Active Problems:   Anemia, iron deficiency   Splenic marginal zone b-cell lymphoma (HCC)   Hypothyroidism   Abdominal tenderness   1. Acute metabolic encephalopathy. Will continue supportive care with IV fluids and neuro checks. Physical therapy evaluation, will continua as needed haldol.   2. Diarrhea. Continue supportive care, patient has recently taken antibiotics. Hold on c diff testing for now.   3. Chronic anemia with thrombocytopenia. Stable cell counts,  Will continue to follow cell count in am.   4. Systolic heart failure. Stable with no signs of decompensation. Will  continue blood pressure monitoring.   5. HTN. Continue carvedilol and losartan.   6, Hypothyroid. Continue with levothyroxine.   7. Stage 1 sacrum pressure ulcer, present on admission. Continue with local wound care.   DVT prophylaxis:scd   Code Status: dnr  Family Communication: no family at the bedside Disposition Plan/ discharge barriers: pending clinical improvement.   Body mass index is 18.61 kg/m. Malnutrition Type:      Malnutrition Characteristics:      Nutrition Interventions:     RN Pressure Injury Documentation: Pressure Injury 06/19/18 Sacrum Medial Stage I -  Intact skin with non-blanchable redness of a localized area usually over a bony prominence. (Active)  06/19/18 2232  Location: Sacrum  Location Orientation: Medial  Staging: Stage I -  Intact skin with non-blanchable redness of a localized area usually over a bony prominence.  Wound Description (Comments):   Present on Admission: Yes     Consultants:     Procedures:     Antimicrobials:      Subjective: Patient is hyporeactive, responds to simple questions, not in apparent pain or dyspnea. No nausea or vomiting.   Objective: Vitals:   06/19/18 2047 06/19/18 2206 06/19/18 2232 06/20/18 0447  BP: 122/86 121/82 (!) 141/68 (!) 119/50  Pulse: 79 75 70   Resp: 18 18 16 18   Temp:  98.2 F (36.8 C) 97.6 F (36.4 C)   TempSrc:  Axillary Axillary   SpO2: 94% 94% 94% 100%  Weight:   53.9 kg   Height:   5\' 7"  (1.702 m)     Intake/Output Summary (Last 24 hours) at 06/20/2018 1306 Last data filed at 06/20/2018 1144 Gross per 24 hour  Intake 949.58 ml  Output -  Net 949.58 ml   Filed Weights   06/19/18 1451 06/19/18 2232  Weight: 51.3 kg 53.9 kg    Examination:   General: deconditioned and ill looking appearing.  Neurology: Awake and alert, non focal  E OVF:IEPP pallor, no icterus, oral mucosa dry Cardiovascular: No JVD. S1-S2 present, rhythmic, no gallops, rubs, or murmurs. Trace  lower extremity edema. Pulmonary: positive breath sounds bilaterally, adequate air movement, no wheezing, rhonchi or rales. On anterior auscultation.  Gastrointestinal. Abdomen with, no organomegaly, non tender, no rebound or guarding Skin. No rashes Musculoskeletal: no joint deformities     Data Reviewed: I have personally reviewed following labs and imaging studies  CBC: Recent Labs  Lab 06/19/18 1640 06/20/18 0544  WBC 6.7 6.2  NEUTROABS 1.6*  --   HGB 7.8* 8.2*  HCT 24.7* 26.6*  MCV 96.9 100.8*  PLT 86* 87*   Basic Metabolic Panel: Recent Labs  Lab 06/19/18 1640  NA 138  K 4.9  CL 108  CO2 24  GLUCOSE 85  BUN 13  CREATININE 0.66  CALCIUM 7.1*   GFR: Estimated Creatinine Clearance: 39 mL/min (by C-G formula based on SCr of 0.66 mg/dL). Liver Function Tests: Recent Labs  Lab 06/19/18 1640  AST 47*  ALT 14  ALKPHOS 78  BILITOT 0.9  PROT 5.7*  ALBUMIN 2.5*   No results for input(s): LIPASE, AMYLASE in the last 168 hours. Recent Labs  Lab 06/19/18 2259  AMMONIA 16   Coagulation Profile: No results for input(s): INR, PROTIME in the last 168 hours. Cardiac Enzymes: No results for input(s): CKTOTAL, CKMB, CKMBINDEX, TROPONINI in the last 168 hours. BNP (last 3 results) No results for input(s): PROBNP in the last 8760 hours. HbA1C: No results for input(s): HGBA1C in the last 72 hours. CBG: Recent Labs  Lab 06/19/18 2249 06/20/18 0012 06/20/18 0440 06/20/18 0904 06/20/18 1140  GLUCAP 86 90 84 77 85   Lipid Profile: No results for input(s): CHOL, HDL, LDLCALC, TRIG, CHOLHDL, LDLDIRECT in the last 72 hours. Thyroid Function Tests: Recent Labs    06/19/18 1844  TSH 12.420*  FREET4 1.17   Anemia Panel: Recent Labs    06/19/18 [place]      Radiology Studies: I have reviewed all of the imaging during this hospital visit personally     Scheduled Meds: . carvedilol  3.125 mg Oral BID WC  . levothyroxine  150 mcg Oral  Q0600  . losartan  25 mg Oral Daily  . mouth rinse  15 mL Mouth Rinse BID   Continuous Infusions: . dextrose 5 % and 0.9% NaCl 75 mL/hr at 06/20/18 0937     LOS: 1 day        Jathniel Smeltzer Gerome Apley, MD

## 2018-06-20 NOTE — Progress Notes (Signed)
Spoke with daughter via telephone. Updated on plan of care. Verbalized understanding.

## 2018-06-20 NOTE — Evaluation (Signed)
Physical Therapy Evaluation Patient Details Name: Kathleen Arias MRN: [id number] DOB: 1925/07/15 Today's Date: 06/20/2018   History of Present Illness  85 y.o. female with medical history significant of chronic combined systolic and diastolic CHF, hypertension, hypothyroidism, and recent closed left hip fracture from mechanical fall 04/2018.  Pt presenting to the hospital for evaluation of altered mental status  Clinical Impression  Pt admitted with above diagnosis. Pt currently with functional limitations due to the deficits listed below (see PT Problem List).  Pt will benefit from skilled PT to increase their independence and safety with mobility to allow discharge to the venue listed below.  Pt requiring total assist for bed mobility and very lethargic during session.  Pt did open eyes and appeared more alert upon sitting.  Pt with unintelligible speech most of the time however did not appear in distress.  Pt neither assisted nor resisted movement for bed mobility.  Per chart review, Pt with recent admission for fall resulting in closed left hip fx which was TDWB in April.  Pt was able to transfer bed to chair with mod assist +2 in hospital prior to d/c to SNF at that time (however poorly maintained TDWB status).  Also per chart review, Pt with supposed appointment with Dr. Percell Miller scheduled for today 06/20/18 in anticipation of updating [place] status.  Pt would benefit from updated [place] status prior to d/c to SNF.       Follow Up Recommendations SNF;Supervision/Assistance - 24 hour    Equipment Recommendations  None recommended by PT    Recommendations for Other Services       Precautions / Restrictions Precautions Precautions: Fall Precaution Comments: recent left closed hip fracture - TDWB at that time Restrictions Weight Bearing Restrictions: Yes LLE Weight Bearing: Touchdown weight bearing Other Position/Activity Restrictions: will maintain previous WB until updated      Mobility  Bed  Mobility Overal bed mobility: Needs Assistance Bed Mobility: Supine to Sit;Sit to Supine     Supine to sit: Total assist Sit to supine: Total assist   General bed mobility comments: pt provided no assistance, not resistant either; more alert once sitting however not assisting with trunk control and requested return to supine  Transfers                    Ambulation/Gait                Stairs            Wheelchair Mobility    Modified Rankin (Stroke Patients Only)       Balance Overall balance assessment: Needs assistance         Standing balance support: No upper extremity supported Standing balance-Leahy Scale: Zero Standing balance comment: reliant on maximum external support                             Pertinent Vitals/Pain Pain Assessment: Faces Faces Pain Scale: Hurts a little bit Pain Location: no specific complaints Pain Intervention(s): Monitored during session;Repositioned    Home Living Family/patient expects to be discharged to:: Skilled nursing facility Living Arrangements: Children(daughter)               Additional Comments: per chart review, recently at [facility] and discharged home    Prior Function Level of Independence: Needs assistance   Gait / Transfers Assistance Needed: previously ambulating with RW prior to April admission for fall with resulting L hip  fx; discharged to rehab after admission and then went home     Comments: pt poor historian     Hand Dominance        Extremity/Trunk Assessment        Lower Extremity Assessment Lower Extremity Assessment: Generalized weakness;Difficult to assess due to impaired cognition(pt not assisting with bed mobility)       Communication   Communication: Expressive difficulties(speech dysarthric and difficult to understand)  Cognition Arousal/Alertness: Lethargic(will open eyes however quickly back to closed eyes, most alert when sitting  upright)   Overall Cognitive Status: No family/caregiver present to determine baseline cognitive functioning                                 General Comments: pt able to open eyes with command however quickly closes, most alert/awake upon sitting      General Comments      Exercises     Assessment/Plan    PT Assessment Patient needs continued PT services  PT Problem List Decreased strength;Decreased mobility;Decreased activity tolerance;Decreased knowledge of use of DME;Decreased cognition;Decreased balance       PT Treatment Interventions DME instruction;Therapeutic activities;Gait training;Therapeutic exercise;Functional mobility training;Balance training;Wheelchair mobility training;Patient/family education    PT Goals (Current goals can be found in the Care Plan section)  Acute Rehab PT Goals PT Goal Formulation: Patient unable to participate in goal setting Time For Goal Achievement: 07/04/18 Potential to Achieve Goals: Fair    Frequency Min 2X/week   Barriers to discharge        Co-evaluation               AM-PAC PT "6 Clicks" Mobility  Outcome Measure Help needed turning from your back to your side while in a flat bed without using bedrails?: Total Help needed moving from lying on your back to sitting on the side of a flat bed without using bedrails?: Total Help needed moving to and from a bed to a chair (including a wheelchair)?: Total Help needed standing up from a chair using your arms (e.g., wheelchair or bedside chair)?: Total Help needed to walk in hospital room?: Total Help needed climbing 3-5 steps with a railing? : Total 6 Click Score: 6    End of Session   Activity Tolerance: Patient limited by lethargy;Patient limited by fatigue Patient left: in bed;with call bell/phone within reach;with bed alarm set Nurse Communication: Mobility status PT Visit Diagnosis: Other abnormalities of gait and mobility (R26.89);Adult, failure to  thrive (R62.7)    Time: 1013-1024 PT Time Calculation (min) (ACUTE ONLY): 11 min   Charges:   PT Evaluation $PT Eval Low Complexity: [place], PT, DPT Acute Rehabilitation Services Office: [phone] Pager: [phone]  Trena Platt 06/20/2018, 2:41 PM

## 2018-06-20 NOTE — Evaluation (Signed)
Clinical/Bedside Swallow Evaluation Patient Details  Name: Kathleen Arias MRN: [id number] Date of Birth: 1925-11-11  Today's Date: 06/20/2018 Time: SLP Start Time (ACUTE ONLY): 8099 SLP Stop Time (ACUTE ONLY): 1810 SLP Time Calculation (min) (ACUTE ONLY): 21 min  Past Medical History:  Past Medical History:  Diagnosis Date  . Anemia   . Anginal pain ([place])   . Arthritis   . Cancer ([place])   . CHF (congestive heart failure) ([place])   . Dyspnea   . Dysrhythmia   . Hypertension   . Hypothyroidism   . Thyroid disease    Past Surgical History:  Past Surgical History:  Procedure Laterality Date  . CARDIAC CATHETERIZATION N/A 01/07/2016   Procedure: Left Heart Cath and Coronary Angiography;  Surgeon: Jettie Booze, MD;  Location: [facility];  Service: Cardiovascular;  Laterality: N/A;  . CATARACT EXTRACTION, BILATERAL    . FRACTURE SURGERY    . HIP SURGERY    . JOINT REPLACEMENT    . TONSILLECTOMY     HPI:  85 year old female who presented with altered mental status per MD note.  Per Md note, "She does have the significant past medical history for systolic heart failure, hypertension and hypothyroidism.  Patient had a (left femur) recent hip fracture about a month ago that required skilled nursing facility.  She was posteriorly discharged home where she was diagnosed with urinary tract infection, treated with antibiotic therapy.  She was noted to have hallucinations and confusions, her primary care provider prescribed olanzapine with mild improvement of her symptoms.  For the last 2 days her symptoms have been worsening, patient more confused and disoriented, poor oral intake and decreased urine output.  On her initial physical examination blood pressure 142/72, heart rate 90, respiratory rate 20, temperature 98.3, oxygen saturation 94%, she had dry mucous membranes, her lungs had no significant rhonchi or wheezing, his abdomen was distended and tender to palpation, hypoactive bowel  sounds, no lower extremity edema. Patient was awake and alert, but confused and disoriented.  Admitted to hospital for work up of encephalopathy.  Pt also has h/o nonHodgkins lymphoma.  Swallow evaluation ordered.    Assessment / Plan / Recommendation Clinical Impression  Patient presents with no focal CN deficits but subtle signs of possible pharyngeal dysphagia.  Throat clearing and multiple swallows noted after completion of her meal concerning for resdiuals/laryngeal penetration.  Pt admits to this SLP that she "choked" earlier on liquids but denies this occurs frequently.  Appearance of mild erythema near faucial pillars.  Recommend continue diet currently - SLP will follow up to assure tolerance and review aspiration precautions given pt's advanced age, recent hip fx and subtle signs of dysphagia.  Pt does admit to large pill dysphagia for which she uses applesauce to swallow.  Advised her to remain upright after intake as she reports she will lay down soon after eating at times.   SLP Visit Diagnosis: Dysphagia, unspecified (R13.10)    Aspiration Risk  Mild aspiration risk    Diet Recommendation Other (Comment)(on clear liquids - ? if d/t abdomen discomfort)   Liquid Administration via: Cup;Straw Medication Administration: Whole meds with puree Supervision: Staff to assist with self feeding Compensations: Slow rate;Small sips/bites Postural Changes: Seated upright at 90 degrees;Remain upright for at least 30 minutes after po intake    Other  Recommendations Oral Care Recommendations: Oral care BID   Follow up Recommendations        Frequency and Duration min 1 x/week  1  week       Prognosis Prognosis for Safe Diet Advancement: Fair      Swallow Study   General Date of Onset: 06/20/18 HPI: 85 year old female who presented with altered mental status per MD note.  Per Md note, "She does have the significant past medical history for systolic heart failure, hypertension and  hypothyroidism.  Patient had a (left femur) recent hip fracture about a month ago that required skilled nursing facility.  She was posteriorly discharged home where she was diagnosed with urinary tract infection, treated with antibiotic therapy.  She was noted to have hallucinations and confusions, her primary care provider prescribed olanzapine with mild improvement of her symptoms.  For the last 2 days her symptoms have been worsening, patient more confused and disoriented, poor oral intake and decreased urine output.  On her initial physical examination blood pressure 142/72, heart rate 90, respiratory rate 20, temperature 98.3, oxygen saturation 94%, she had dry mucous membranes, her lungs had no significant rhonchi or wheezing, his abdomen was distended and tender to palpation, hypoactive bowel sounds, no lower extremity edema. Patient was awake and alert, but confused and disoriented.  Admitted to hospital for work up of encephalopathy.  Pt also has h/o nonHodgkins lymphoma.  Swallow evaluation ordered.  Type of Study: Bedside Swallow Evaluation Diet Prior to this Study: (clears) Temperature Spikes Noted: No Respiratory Status: Room air History of Recent Intubation: No Behavior/Cognition: Alert;Cooperative;Pleasant mood Oral Cavity Assessment: Within Functional Limits Oral Care Completed by SLP: No Oral Cavity - Dentition: Adequate natural dentition Vision: Functional for self-feeding Self-Feeding Abilities: Able to feed self;Needs set up(some difficulty with feeding herself therefor SLP provided assistance) Patient Positioning: Upright in bed Baseline Vocal Quality: Low vocal intensity(pt reports voice is much weaker than normal) Volitional Cough: Strong Volitional Swallow: Able to elicit    Oral/Motor/Sensory Function Overall Oral Motor/Sensory Function: Within functional limits   Ice Chips Ice chips: Not tested   Thin Liquid Thin Liquid: Within functional limits Presentation:  Cup;Straw Other Comments:  delayed throat clearing noted at completion of meal - pt reports issues with sinus drainage but also admits she "choked" earlier and has been having some trouble since    Nectar Thick Nectar Thick Liquid: Within functional limits Presentation: Self Fed;Cup;Straw Other Comments: delayed throat clearing   Honey Thick Honey Thick Liquid: Not tested   Puree Puree: Within functional limits(jello) Presentation: Spoon   Solid     Solid: Not tested Other Comments: pt on clear liquid diet      Macario Golds 06/20/2018,7:16 PM  Luanna Salk, [place] [facility] SLP [place] Pager [phone] Office [phone]

## 2018-06-21 DIAGNOSIS — L89301 Pressure ulcer of unspecified buttock, stage 1: Secondary | ICD-10-CM

## 2018-06-21 DIAGNOSIS — L8990 Pressure ulcer of unspecified site, unspecified stage: Secondary | ICD-10-CM

## 2018-06-21 LAB — CBC WITH DIFFERENTIAL/PLATELET
Abs Immature Granulocytes: 0.01 10*3/uL (ref 0.00–0.07)
Basophils Absolute: 0 10*3/uL (ref 0.0–0.1)
Basophils Relative: 0 %
Eosinophils Absolute: 0 10*3/uL (ref 0.0–0.5)
Eosinophils Relative: 0 %
HCT: 25.2 % — ABNORMAL LOW (ref 36.0–46.0)
Hemoglobin: 7.8 g/dL — ABNORMAL LOW (ref 12.0–15.0)
Immature Granulocytes: 0 %
Lymphocytes Relative: 43 %
Lymphs Abs: 2.2 10*3/uL (ref 0.7–4.0)
MCH: 30.6 pg (ref 26.0–34.0)
MCHC: 31 g/dL (ref 30.0–36.0)
MCV: 98.8 fL (ref 80.0–100.0)
Monocytes Absolute: 1.6 10*3/uL — ABNORMAL HIGH (ref 0.1–1.0)
Monocytes Relative: 33 %
Neutro Abs: 1.2 10*3/uL — ABNORMAL LOW (ref 1.7–7.7)
Neutrophils Relative %: 24 %
Platelets: 88 10*3/uL — ABNORMAL LOW (ref 150–400)
RBC: 2.55 MIL/uL — ABNORMAL LOW (ref 3.87–5.11)
RDW: 18.5 % — ABNORMAL HIGH (ref 11.5–15.5)
WBC: 5 10*3/uL (ref 4.0–10.5)
nRBC: 0 % (ref 0.0–0.2)

## 2018-06-21 LAB — BASIC METABOLIC PANEL
Anion gap: 6 (ref 5–15)
BUN: 13 mg/dL (ref 8–23)
CO2: 24 mmol/L (ref 22–32)
Calcium: 7.4 mg/dL — ABNORMAL LOW (ref 8.9–10.3)
Chloride: 111 mmol/L (ref 98–111)
Creatinine, Ser: 0.64 mg/dL (ref 0.44–1.00)
GFR calc Af Amer: >60 mL/min (ref >60)
GFR calc non Af Amer: >60 mL/min (ref >60)
Glucose, Bld: 100 mg/dL — ABNORMAL HIGH (ref 70–99)
Potassium: 3.4 mmol/L — ABNORMAL LOW (ref 3.5–5.1)
Sodium: 141 mmol/L (ref 135–145)

## 2018-06-21 LAB — GLUCOSE, CAPILLARY
Glucose-Capillary: 111 mg/dL — ABNORMAL HIGH (ref 70–99)
Glucose-Capillary: 92 mg/dL (ref 70–99)
Glucose-Capillary: 92 mg/dL (ref 70–99)
Glucose-Capillary: 92 mg/dL (ref 70–99)
Glucose-Capillary: 95 mg/dL (ref 70–99)
Glucose-Capillary: 99 mg/dL (ref 70–99)

## 2018-06-21 MED ORDER — ENSURE ENLIVE PO LIQD
237.0000 mL | Freq: Two times a day (BID) | ORAL | Status: DC
  Administered 2018-06-21 – 2018-06-23 (×4): 237 mL via ORAL

## 2018-06-21 NOTE — NC FL2 (Signed)
[place] MEDICAID FL2 LEVEL OF CARE SCREENING TOOL     IDENTIFICATION  Patient Name: Kathleen Arias Birthdate: 28-Jan-1925 Sex: female Admission Date (Current Location): 06/19/2018  [facility] and [place] Number:  Herbalist and Address:  [facility],  [place], [place]      Provider Number: [id number]  Attending Physician Name and Address:  Tawni Millers,*  Relative Name and Phone Number:  [name] [phone]    Current Level of Care: Hospital Recommended Level of Care: [place] Prior Approval Number:    Date Approved/Denied:   PASRR Number:  [id number]  Discharge Plan: SNF    Current Diagnoses: Patient Active Problem List   Diagnosis Date Noted  . Pressure injury of skin 06/21/2018  . Abdominal tenderness 06/20/2018  . Acute encephalopathy 06/19/2018  . Chronic HFrEF (heart failure with reduced ejection fraction) ([place]) 05/13/2018  . Peri-prosthetic intertrochanteric fracture of femur   . Closed left hip fracture ([place]) 05/09/2018  . Weakness 03/04/2018  . UTI (urinary tract infection) 03/03/2018  . NICM (nonischemic cardiomyopathy) ([place]) 03/02/2018  . Hypothyroidism 03/02/2018  . Generalized weakness 05/19/2017  . Edema of left lower extremity 02/21/2017  . Splenic marginal zone b-cell lymphoma ([place]) 06/20/2016  . Counseling regarding advanced care planning and goals of care 05/21/2016  . Iron deficiency anemia 05/16/2016  . Anemia, iron deficiency 01/07/2016  . Normocytic anemia 01/07/2016  . Abnormal stress test   . Chest pain 01/05/2016  . Shortness of breath   . Localized swelling of lower extremity   . Hyponatremia   . Hyperglycemia   . Musculoskeletal pain     Orientation RESPIRATION BLADDER Height & Weight     Self, Place  Normal Continent Weight: 53.9 kg Height:  5\' 7"  (170.2 cm)  BEHAVIORAL SYMPTOMS/MOOD NEUROLOGICAL BOWEL NUTRITION STATUS      Continent Diet(Reg-clear  liq-aspiration risk)  AMBULATORY STATUS COMMUNICATION OF NEEDS Skin   Limited Assist Verbally Other (Comment)(stage 1 sacrum-foam dsg)                       Personal Care Assistance Level of Assistance  Bathing, Feeding, Dressing Bathing Assistance: Limited assistance Feeding assistance: Limited assistance Dressing Assistance: Limited assistance     Functional Limitations Info             SPECIAL CARE FACTORS FREQUENCY  PT (By licensed PT), OT (By licensed OT), Speech therapy     PT Frequency: 5x week OT Frequency: 5x week     Speech Therapy Frequency: 1x week      Contractures Contractures Info: Not present    Additional Factors Info  Code Status Code Status Info: DNR             Current Medications (06/21/2018):  This is the current hospital active medication list Current Facility-Administered Medications  Medication Dose Route Frequency Provider Last Rate Last Dose  . carvedilol (COREG) tablet 3.125 mg  3.125 mg Oral BID WC Shela Leff, MD   3.125 mg at 06/21/18 0806  . dextrose 5 %-0.9 % sodium chloride infusion   Intravenous Continuous Shela Leff, MD 75 mL/hr at 06/20/18 2247    . levothyroxine (SYNTHROID) tablet 150 mcg  150 mcg Oral Q0600 Shela Leff, MD   150 mcg at 06/21/18 0549  . losartan (COZAAR) tablet 25 mg  25 mg Oral Daily Shela Leff, MD   25 mg at 06/21/18 0806  . MEDLINE mouth rinse  15 mL Mouth Rinse BID Arrien, Jimmy Picket, MD   15 mL at 06/21/18 0806     Discharge Medications: Please see discharge summary for a list of discharge medications.  Relevant Imaging Results:  Relevant Lab Results:   Additional Information [address] [place], [place]

## 2018-06-21 NOTE — Progress Notes (Signed)
Initial Nutrition Assessment  INTERVENTION:   -Provide Ensure Enlive po BID, each supplement provides 350 kcal and 20 grams of protein -Provide Magic cup BID with meals, each supplement provides 290 kcal and 9 grams of protein -with assistance if able  NUTRITION DIAGNOSIS:   Inadequate oral intake related to lethargy/confusion, dysphagia as evidenced by meal completion < 50%.  GOAL:   Patient will meet greater than or equal to 90% of their needs  MONITOR:   PO intake, Supplement acceptance, Labs, Weight trends, I & O's, Skin  REASON FOR ASSESSMENT:   Consult Assessment of nutrition requirement/status  ASSESSMENT:   85 year old female who presented with altered mental status.  She does have the significant past medical history for systolic heart failure, hypertension and hypothyroidism.  Patient had a recent hip fracture about a month ago that required skilled nursing facility. Admitted for metabolic encephalopathy.  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 Not currently receiving treatment per chart review. Pt with some confusion. Per SLP note, pt was provided a [place] sandwich and milk for lunch today. Recommended that pt consume finger foods given vision deficits and weakness. Was able to swallow single boluses of liquids well. Pt consumed 25% of her lunch meal. Pt's appetite is improving. Pt has a history of poor appetite.  Will order Ensure supplements and with assistance Magic cups with meals.   Per weight records, pt with no significant weight loss but weights have been trending down since 2017 (weighed 145 lbs then).  NUTRITION - FOCUSED PHYSICAL EXAM:  Unable to perform per department requirements to work remotely.   Diet Order:   Diet Order            Diet regular Room service appropriate? Yes; Fluid consistency: Thin  Diet effective now              EDUCATION NEEDS:   No education needs have been identified at this  time  Skin:  Skin Assessment: Skin Integrity Issues: Skin Integrity Issues:: Stage I Stage I: medial sacrum  Last BM:  6/4  Height:   Ht Readings from Last 1 Encounters:  06/19/18 5\' 7"  (1.702 m)    Weight:   Wt Readings from Last 1 Encounters:  06/19/18 53.9 kg    Ideal Body Weight:  61.3 kg  BMI:  Body mass index is 18.61 kg/m.  Estimated Nutritional Needs:   Kcal:  1400-1600  Protein:  65-75g  Fluid:  1.6L/day  Kathleen Bibles, MS, RD, LDN [place] Dietitian Pager: [phone] After Hours Pager: [phone]

## 2018-06-21 NOTE — Progress Notes (Addendum)
PROGRESS NOTE    Kathleen Arias  DQQ:[id number] DOB: 08/13/1925 DOA: 06/19/2018 PCP: [name], MD    Brief Narrative:  85 year old female who presented with altered mental status.  She does have the significant past medical history for systolic heart failure, hypertension and hypothyroidism.  Patient had a recent hip fracture about a month ago that required skilled nursing facility.  She was posteriorly discharged home where she was diagnosed with urinary tract infection, treated with antibiotic therapy.  She was noted to have hallucinations and confusions, her primary care provider prescribed olanzapine with mild improvement of her symptoms.  For the last 2 days her symptoms have been worsening, patient more confused and disoriented, poor oral intake and decreased urine output.  On her initial physical examination blood pressure 142/72, heart rate 90, respiratory rate 20, temperature 98.3, oxygen saturation 94%, she had dry mucous membranes, her lungs had no significant rhonchi or wheezing, his abdomen was distended and tender to palpation, hypoactive bowel sounds, no lower extremity edema. Patient was awake and alert, but confused and disoriented.  Nonfocal.  Patient was admitted to the hospital working diagnosis of metabolic encephalopathy.   Assessment & Plan:   Principal Problem:   Acute encephalopathy Active Problems:   Anemia, iron deficiency   Splenic marginal zone b-cell lymphoma (HCC)   Hypothyroidism   Abdominal tenderness   Pressure injury of skin   1. Acute metabolic encephalopathy. Patient is more awake and alert this am, but continue to be disorientated to place and situation. Improved appetite, physical therapy recommends SNF at discharge, continue neuro checks per unit protocol, aspiration precautions and swallow evaluation. Consult orthopedics, about possible ambulation. Nutrition consult.   2. Diarrhea. Clinically improved, will continue close monitoring.   3.  Chronic anemia with thrombocytopenia. Hgb at 7,8 with wbc at 5,0 and platelets at 88, will continue close monitoring, possible marrow dysfunction.   4. Systolic  heart failure. Continue to be hypovolemic, continue hydration with IV fluids, will hold on diuretic therapy for now. Will dc telemetry.   5. HTN. Blood pressure control with carvedilol and losartan. Systolic blood pressure today 101 mmHg,   6, Hypothyroid. On levothyroxine. TSH is 12,43 from 13,1, recently changed dose of levothyroxine, will continue current dose and will plan to follow up as outpatient.   7. Stage 1 sacrum pressure ulcer, present on admission.  Local wound care. Out of bed per orthopedic recommendations.   DVT prophylaxis:scd   Code Status: dnr  Family Communication: no family at the bedside, I spoke with her daughter over the phone and all questions were addressed.  Disposition Plan/ discharge barriers: pending clinical improvement, possible SNF.   Body mass index is 18.61 kg/m. Malnutrition Type:      Malnutrition Characteristics:      Nutrition Interventions:     RN Pressure Injury Documentation: Pressure Injury 06/19/18 Sacrum Medial Stage I -  Intact skin with non-blanchable redness of a localized area usually over a bony prominence. (Active)  06/19/18 2232  Location: Sacrum  Location Orientation: Medial  Staging: Stage I -  Intact skin with non-blanchable redness of a localized area usually over a bony prominence.  Wound Description (Comments):   Present on Admission: Yes     Consultants:     Procedures:     Antimicrobials:       Subjective: Patient is feeling better, but continue to be disorientated, no chest pain or dyspnea, no abdominal pain. Improved appetite.   Objective: Vitals:   06/20/18 0447  06/20/18 1348 06/20/18 2038 06/21/18 0415  BP: (!) 119/50 (!) 130/52 (!) 117/52 (!) 127/58  Pulse:  64 61 66  Resp: 18 (!) 21 20 20   Temp:  97.6 F (36.4 C) 97.6 F  (36.4 C) 98 F (36.7 C)  TempSrc:   Oral   SpO2: 100% 95% 99% 90%  Weight:      Height:        Intake/Output Summary (Last 24 hours) at 06/21/2018 1200 Last data filed at 06/21/2018 0800 Gross per 24 hour  Intake 1986.24 ml  Output 125 ml  Net 1861.24 ml   Filed Weights   06/19/18 1451 06/19/18 2232  Weight: 51.3 kg 53.9 kg    Examination:   General: deconditioned and ill looking appearing Neurology: Awake and alert, non focal, no lack of attention or disorganized thinking, she is disorientated to place but no time or person.  E ENT: positive pallor, no icterus, oral mucosa moist Cardiovascular: No JVD. S1-S2 present, rhythmic, no gallops, rubs, or murmurs. No lower extremity edema. Pulmonary: positive breath sounds bilaterally, adequate air movement, no wheezing, rhonchi or rales. Gastrointestinal. Abdomen with no organomegaly, non tender, no rebound or guarding Skin. No rashes Musculoskeletal: no joint deformities     Data Reviewed: I have personally reviewed following labs and imaging studies  CBC: Recent Labs  Lab 06/19/18 1640 06/20/18 0544 06/21/18 0602  WBC 6.7 6.2 5.0  NEUTROABS 1.6*  --  1.2*  HGB 7.8* 8.2* 7.8*  HCT 24.7* 26.6* 25.2*  MCV 96.9 100.8* 98.8  PLT 86* 87* 88*   Basic Metabolic Panel: Recent Labs  Lab 06/19/18 1640 06/21/18 0602  NA 138 141  K 4.9 3.4*  CL 108 111  CO2 24 24  GLUCOSE 85 100*  BUN 13 13  CREATININE 0.66 0.64  CALCIUM 7.1* 7.4*   GFR: Estimated Creatinine Clearance: 39 mL/min (by C-G formula based on SCr of 0.64 mg/dL). Liver Function Tests: Recent Labs  Lab 06/19/18 1640  AST 47*  ALT 14  ALKPHOS 78  BILITOT 0.9  PROT 5.7*  ALBUMIN 2.5*   No results for input(s): LIPASE, AMYLASE in the last 168 hours. Recent Labs  Lab 06/19/18 2259  AMMONIA 16   Coagulation Profile: No results for input(s): INR, PROTIME in the last 168 hours. Cardiac Enzymes: No results for input(s): CKTOTAL, CKMB, CKMBINDEX,  TROPONINI in the last 168 hours. BNP (last 3 results) No results for input(s): PROBNP in the last 8760 hours. HbA1C: No results for input(s): HGBA1C in the last 72 hours. CBG: Recent Labs  Lab 06/20/18 2030 06/21/18 0019 06/21/18 0409 06/21/18 0732 06/21/18 1151  GLUCAP 105* 92 92 92 95   Lipid Profile: No results for input(s): CHOL, HDL, LDLCALC, TRIG, CHOLHDL, LDLDIRECT in the last 72 hours. Thyroid Function Tests: Recent Labs    06/19/18 1844  TSH 12.420*  FREET4 1.17   Anemia Panel: Recent Labs    06/19/18 [place]      Radiology Studies: I have reviewed all of the imaging during this hospital visit personally     Scheduled Meds: . carvedilol  3.125 mg Oral BID WC  . levothyroxine  150 mcg Oral Q0600  . losartan  25 mg Oral Daily  . mouth rinse  15 mL Mouth Rinse BID   Continuous Infusions: . dextrose 5 % and 0.9% NaCl 75 mL/hr at 06/20/18 2247     LOS: 2 days        Winferd Wease Gerome Apley, MD

## 2018-06-21 NOTE — Progress Notes (Signed)
RN assumed care of patient. Agree with previous nurse assessment. Will continue to monitor.  

## 2018-06-21 NOTE — Progress Notes (Signed)
  Speech Language Pathology Treatment: Dysphagia  Patient Details Name: Kathleen Arias MRN: [id number] DOB: 1925-09-02 Today's Date: 06/21/2018 Time: 0258-5277 SLP Time Calculation (min) (ACUTE ONLY): 15 min  Assessment / Plan / Recommendation Clinical Impression  Today pt fully alert and reporting desire for some "food".  She was provided with [place] sandwich, milk and water to consume.  Pt able to self feed, adequate mastication and oral clearance with solids.  She did demonstrate immediate subtle cough after swallowing thin liquids, likely due to premature spillage of bolus into larynx before swallow trigger.  However small SINGLE boluses were tolerated without indication of airway compromise.  Pt also with negative CXR 6/2 and suspect has been tolerating probable mild aspiration of thin liquids prior to admission.    At this time, recommend to continue thin liquids, will follow up briefly to assure tolerance with dietary advancement most notably given her mild s/s of aspiration. Initially highly recommend full supervision with po to assure tolerance. Encouraging finger food intake will likely improve pt's intake given her vision deficits and weakness impacting ability to use utensils. Advised pt to precautions to which she advised understanding.  Informed RN of recommendations and posted new swallow precaution sign.      HPI HPI: 85 year old female who presented with altered mental status per MD note.  Per Md note, "She does have the significant past medical history for systolic heart failure, hypertension and hypothyroidism.  Patient had a (left femur) recent hip fracture about a month ago that required skilled nursing facility.  She was posteriorly discharged home where she was diagnosed with urinary tract infection, treated with antibiotic therapy.  She was noted to have hallucinations and confusions, her primary care provider prescribed olanzapine with mild improvement of her symptoms.  For the last  2 days her symptoms have been worsening, patient more confused and disoriented, poor oral intake and decreased urine output.  On her initial physical examination blood pressure 142/72, heart rate 90, respiratory rate 20, temperature 98.3, oxygen saturation 94%, she had dry mucous membranes, her lungs had no significant rhonchi or wheezing, his abdomen was distended and tender to palpation, hypoactive bowel sounds, no lower extremity edema. Patient was awake and alert, but confused and disoriented.  Admitted to hospital for work up of encephalopathy.  Pt also has h/o nonHodgkins lymphoma.  Swallow evaluation ordered.       SLP Plan  Continue with current plan of care       Recommendations  Diet recommendations: Regular;Thin liquid(finger foods) Medication Administration: Whole meds with puree Supervision: Patient able to self feed;Full supervision/cueing for compensatory strategies(full supervision initially please) Compensations: Slow rate;Small sips/bites(SINGLE SIPS ONLY) Postural Changes and/or Swallow Maneuvers: Seated upright 90 degrees;Upright 30-60 min after meal                Oral Care Recommendations: Oral care BID SLP Visit Diagnosis: Dysphagia, unspecified (R13.10) Plan: Continue with current plan of care       GO                Macario Golds 06/21/2018, 1:03 PM  Luanna Salk, [facility] SLP Acute Rehab Services Pager [phone] Office [phone]

## 2018-06-21 NOTE — TOC Initial Note (Signed)
Transition of Care [facility]) - Initial/Assessment Note    Patient Details  Name: Kathleen Arias MRN: [id number] Date of Birth: [birth year]  Transition of Care [facility]) CM/SW Contact:    Dessa Phi, RN Phone Number: 06/21/2018, 2:48 PM  Clinical Narrative: TC [place] to start [place] for SNF-Preferred SNF is [facility] per dtr-[name] but have not received acceptance yet. Faxed w/confirmation to [facility] #[phone],[id number] -H&P, PT/ST eval, Progress notes, from 6/2-6/4-await auth.                  Expected Discharge Plan: Skilled Nursing Facility Barriers to Discharge: Insurance Authorization   Patient Goals and CMS Choice Patient states their goals for this hospitalization and ongoing recovery are:: get stronger CMS Medicare.gov Compare Post Acute Care list provided to:: Patient Represenative (must comment) Choice offered to / list presented to : Adult Children  Expected Discharge Plan and Services Expected Discharge Plan: [place]   Discharge Planning Services: CM Consult Post Acute Care Choice: [place] Living arrangements for the past 2 months: Single Family Home Expected Discharge Date: (unknown)                                    Prior Living Arrangements/Services Living arrangements for the past 2 months: Single Family Home Lives with:: Adult Children Patient language and need for interpreter reviewed:: Yes Do you feel safe going back to the place where you live?: Yes      Need for Family Participation in Patient Care: No (Comment) Care giver support system in place?: Yes (comment) Current home services: DME, Home RN, Home PT, Home OT(w/c,hospital bed,transfer board;Active w/[facility]-HHRN/PT/OT) Criminal Activity/Legal Involvement Pertinent to Current Situation/Hospitalization: No - Comment as needed  Activities of Daily Living Home Assistive Devices/Equipment: Bedside commode/3-in-1, Walker (specify type), Other  (Comment)(front wheeled walker, shower/tub unit) ADL Screening (condition at time of admission) Patient's cognitive ability adequate to safely complete daily activities?: No Is the patient deaf or have difficulty hearing?: No Does the patient have difficulty seeing, even when wearing glasses/contacts?: No Does the patient have difficulty concentrating, remembering, or making decisions?: Yes Patient able to express need for assistance with ADLs?: Yes Does the patient have difficulty dressing or bathing?: Yes Independently performs ADLs?: No Communication: Independent Dressing (OT): Needs assistance Is this a change from baseline?: Pre-admission baseline Grooming: Needs assistance Is this a change from baseline?: Pre-admission baseline Feeding: Needs assistance Is this a change from baseline?: Pre-admission baseline Bathing: Needs assistance Is this a change from baseline?: Pre-admission baseline Toileting: Needs assistance Is this a change from baseline?: Pre-admission baseline In/Out Bed: Dependent, Needs assistance Is this a change from baseline?: Pre-admission baseline Walks in Home: Needs assistance Is this a change from baseline?: Pre-admission baseline Does the patient have difficulty walking or climbing stairs?: Yes Weakness of Legs: Both Weakness of Arms/Hands: Both  Permission Sought/Granted Permission sought to share information with : Case Manager Permission granted to share information with : Yes, Verbal Permission Granted  Share Information with NAME: [name]  Permission granted to share info w AGENCY: SNF  Permission granted to share info w Relationship: daughter  Permission granted to share info w Contact Information: [phone]  Emotional Assessment Appearance:: Appears stated age Attitude/Demeanor/Rapport: Gracious Affect (typically observed): Pleasant Orientation: : Oriented to Self, Oriented to Situation Alcohol / Substance Use: Never Used Psych  Involvement: No (comment)  Admission diagnosis:  Delusion ([place]) [  F22] Agitation [R45.1] Anemia, unspecified type [D64.9] Hypothyroidism, unspecified type [E03.9] Acute encephalopathy [G93.40] Patient Active Problem List   Diagnosis Date Noted  . Pressure injury of skin 06/21/2018  . Abdominal tenderness 06/20/2018  . Acute encephalopathy 06/19/2018  . Chronic HFrEF (heart failure with reduced ejection fraction) ([place]) 05/13/2018  . Peri-prosthetic intertrochanteric fracture of femur   . Closed left hip fracture ([place]) 05/09/2018  . Weakness 03/04/2018  . UTI (urinary tract infection) 03/03/2018  . NICM (nonischemic cardiomyopathy) ([place]) 03/02/2018  . Hypothyroidism 03/02/2018  . Generalized weakness 05/19/2017  . Edema of left lower extremity 02/21/2017  . Splenic marginal zone b-cell lymphoma ([place]) 06/20/2016  . Counseling regarding advanced care planning and goals of care 05/21/2016  . Iron deficiency anemia 05/16/2016  . Anemia, iron deficiency 01/07/2016  . Normocytic anemia 01/07/2016  . Abnormal stress test   . Chest pain 01/05/2016  . Shortness of breath   . Localized swelling of lower extremity   . Hyponatremia   . Hyperglycemia   . Musculoskeletal pain    PCP:  [name], MD Pharmacy:   [facility] DRUG STORE [place], [place] [address] [place] [address] Phone: [phone] Fax: [phone]     Social Determinants of Health (SDOH) Interventions    Readmission Risk Interventions No flowsheet data found.

## 2018-06-21 NOTE — TOC Initial Note (Signed)
Transition of Care [facility]) - Initial/Assessment Note    Patient Details  Name: Kathleen Arias MRN: [id number] Date of Birth: [birth year]  Transition of Care [facility]) CM/SW Contact:    Dessa Phi, RN Phone Number: 06/21/2018, 2:26 PM  Clinical Narrative:Patient alert x2. Deferred to dtr-[name]-she agreed to fax out to SNF-prefers [facility]-recent stay, has gone to [employer] in [place] for auth.Olin Hauser prefers SNF if coherent,but return back home w/[facility] for [facility]- if not coherent- rep Ronalee Belts following.                 Expected Discharge Plan: Skilled Nursing Facility Barriers to Discharge: Continued Medical Work up   Patient Goals and CMS Choice Patient states their goals for this hospitalization and ongoing recovery are:: get stronger CMS Medicare.gov Compare Post Acute Care list provided to:: Patient Represenative (must comment) Choice offered to / list presented to : Adult Children  Expected Discharge Plan and Services Expected Discharge Plan: [place]   Discharge Planning Services: CM Consult Post Acute Care Choice: [place] Living arrangements for the past 2 months: Single Family Home Expected Discharge Date: (unknown)                                    Prior Living Arrangements/Services Living arrangements for the past 2 months: Single Family Home Lives with:: Adult Children Patient language and need for interpreter reviewed:: Yes Do you feel safe going back to the place where you live?: Yes      Need for Family Participation in Patient Care: No (Comment) Care giver support system in place?: Yes (comment) Current home services: DME, Home RN, Home PT, Home OT(w/c,hospital bed,transfer board;Active w/[facility]-HHRN/PT/OT) Criminal Activity/Legal Involvement Pertinent to Current Situation/Hospitalization: No - Comment as needed  Activities of Daily Living Home Assistive Devices/Equipment: Bedside commode/3-in-1, Walker (specify  type), Other (Comment)(front wheeled walker, shower/tub unit) ADL Screening (condition at time of admission) Patient's cognitive ability adequate to safely complete daily activities?: No Is the patient deaf or have difficulty hearing?: No Does the patient have difficulty seeing, even when wearing glasses/contacts?: No Does the patient have difficulty concentrating, remembering, or making decisions?: Yes Patient able to express need for assistance with ADLs?: Yes Does the patient have difficulty dressing or bathing?: Yes Independently performs ADLs?: No Communication: Independent Dressing (OT): Needs assistance Is this a change from baseline?: Pre-admission baseline Grooming: Needs assistance Is this a change from baseline?: Pre-admission baseline Feeding: Needs assistance Is this a change from baseline?: Pre-admission baseline Bathing: Needs assistance Is this a change from baseline?: Pre-admission baseline Toileting: Needs assistance Is this a change from baseline?: Pre-admission baseline In/Out Bed: Dependent, Needs assistance Is this a change from baseline?: Pre-admission baseline Walks in Home: Needs assistance Is this a change from baseline?: Pre-admission baseline Does the patient have difficulty walking or climbing stairs?: Yes Weakness of Legs: Both Weakness of Arms/Hands: Both  Permission Sought/Granted Permission sought to share information with : Case Manager Permission granted to share information with : Yes, Verbal Permission Granted  Share Information with NAME: [name]  Permission granted to share info w AGENCY: SNF  Permission granted to share info w Relationship: daughter  Permission granted to share info w Contact Information: [phone]  Emotional Assessment Appearance:: Appears stated age Attitude/Demeanor/Rapport: Gracious Affect (typically observed): Pleasant Orientation: : Oriented to Self, Oriented to Situation Alcohol / Substance Use: Never  Used Psych Involvement: No (comment)  Admission diagnosis:  Delusion ([place]) [F22] Agitation [R45.1] Anemia, unspecified type [D64.9] Hypothyroidism, unspecified type [E03.9] Acute encephalopathy [G93.40] Patient Active Problem List   Diagnosis Date Noted  . Pressure injury of skin 06/21/2018  . Abdominal tenderness 06/20/2018  . Acute encephalopathy 06/19/2018  . Chronic HFrEF (heart failure with reduced ejection fraction) ([place]) 05/13/2018  . Peri-prosthetic intertrochanteric fracture of femur   . Closed left hip fracture ([place]) 05/09/2018  . Weakness 03/04/2018  . UTI (urinary tract infection) 03/03/2018  . NICM (nonischemic cardiomyopathy) ([place]) 03/02/2018  . Hypothyroidism 03/02/2018  . Generalized weakness 05/19/2017  . Edema of left lower extremity 02/21/2017  . Splenic marginal zone b-cell lymphoma ([place]) 06/20/2016  . Counseling regarding advanced care planning and goals of care 05/21/2016  . Iron deficiency anemia 05/16/2016  . Anemia, iron deficiency 01/07/2016  . Normocytic anemia 01/07/2016  . Abnormal stress test   . Chest pain 01/05/2016  . Shortness of breath   . Localized swelling of lower extremity   . Hyponatremia   . Hyperglycemia   . Musculoskeletal pain    PCP:  [name], MD Pharmacy:   [facility] DRUG STORE [place], [place] - [address] AT [address] [place] [address] Phone: [phone] Fax: [phone]     Social Determinants of Health (SDOH) Interventions    Readmission Risk Interventions No flowsheet data found.

## 2018-06-22 ENCOUNTER — Other Ambulatory Visit: Payer: Self-pay | Admitting: Cardiovascular Disease

## 2018-06-22 DIAGNOSIS — C8307 Small cell B-cell lymphoma, spleen: Secondary | ICD-10-CM

## 2018-06-22 DIAGNOSIS — D61818 Other pancytopenia: Secondary | ICD-10-CM

## 2018-06-22 DIAGNOSIS — R10817 Generalized abdominal tenderness: Secondary | ICD-10-CM

## 2018-06-22 LAB — GLUCOSE, CAPILLARY
Glucose-Capillary: 89 mg/dL (ref 70–99)
Glucose-Capillary: 90 mg/dL (ref 70–99)
Glucose-Capillary: 84 mg/dL (ref 70–99)
Glucose-Capillary: 108 mg/dL — ABNORMAL HIGH (ref 70–99)
Glucose-Capillary: 88 mg/dL (ref 70–99)
Glucose-Capillary: 105 mg/dL — ABNORMAL HIGH (ref 70–99)

## 2018-06-22 LAB — SARS CORONAVIRUS 2 BY RT PCR (HOSPITAL ORDER, PERFORMED IN ~~LOC~~ HOSPITAL LAB): SARS Coronavirus 2: NEGATIVE

## 2018-06-22 MED ORDER — TORSEMIDE 10 MG PO TABS: 10.0000 mg | ORAL_TABLET | Freq: Every day | ORAL | 3 refills | Status: DC

## 2018-06-22 NOTE — Progress Notes (Signed)
Nutrition Follow-up  INTERVENTION:   -Continue Ensure Enlive po BID, each supplement provides 350 kcal and 20 grams of protein -Continue Magic cup BID with meals, each supplement provides 290 kcal and 9 grams of protein  NUTRITION DIAGNOSIS:   Inadequate oral intake related to lethargy/confusion, dysphagia as evidenced by meal completion < 50%.  Ongoing. Meal completion <25-50%  GOAL:   Patient will meet greater than or equal to 90% of their needs  Progressing.  MONITOR:   PO intake, Supplement acceptance, Labs, Weight trends, I & O's, Skin  REASON FOR ASSESSMENT:   Consult Assessment of nutrition requirement/status  ASSESSMENT:   85 year old female who presented with altered mental status.  She does have the significant past medical history for systolic heart failure, hypertension and hypothyroidism.  Patient had a recent hip fracture about a month ago that required skilled nursing facility. Admitted for metabolic encephalopathy.  **RD working remotely**  Initial assessment completed 6/4.  Patient now consuming 25-50% of meals as of 6/4. Pt is drinking Ensure supplements provided.   No new weights have been recorded this admission.  Medications reviewed. Labs reviewed: CBGs: 84-108 Low K  Diet Order:   Diet Order            Diet regular Room service appropriate? Yes; Fluid consistency: Thin  Diet effective now              EDUCATION NEEDS:   No education needs have been identified at this time  Skin:  Skin Assessment: Skin Integrity Issues: Skin Integrity Issues:: Stage I Stage I: medial sacrum  Last BM:  6/4  Height:   Ht Readings from Last 1 Encounters:  06/19/18 5\' 7"  (1.702 m)    Weight:   Wt Readings from Last 1 Encounters:  06/19/18 53.9 kg    Ideal Body Weight:  61.3 kg  BMI:  Body mass index is 18.61 kg/m.  Estimated Nutritional Needs:   Kcal:  1400-1600  Protein:  65-75g  Fluid:  1.6L/day  Clayton Bibles, MS, RD,  LDN [facility] [facility] Pager: [phone] After Hours Pager: [phone]

## 2018-06-22 NOTE — Telephone Encounter (Signed)
°*  STAT* If patient is at the pharmacy, call can be transferred to refill team.   1. Which medications need to be refilled? (please list name of each medication and dose if known)  torsemide (DEMADEX) 10 MG tablet  2. Which pharmacy/location (including street and city if local pharmacy) is medication to be sent to? [place], [place] - [address] AT [address]  3. Do they need a 30 day or 90 day supply? 59  Dr. Johnsie Cancel originally wrote this Rx for 20 Mg, but while in rehab, the rehab Dr. Reduced medication to 10mg , and Daughter feels the lower dose is helping.  There are no refills left

## 2018-06-22 NOTE — Progress Notes (Addendum)
PROGRESS NOTE    Labresha Mellor  YIR:[id number] DOB: 10/14/1925 DOA: 06/19/2018 PCP: [name], MD    Brief Narrative:  85 year old female who presented with altered mental status. She does have the significant past medical history for systolic heart failure, hypertension andhypothyroidism.Patient had a recent hip fracture about a month ago that required skilled nursing facility. She was posteriorly discharged home where she was diagnosed with urinary tract infection, treated with antibiotic therapy. She was noted to have hallucinations and confusions, her primary care provider prescribed olanzapine with mild improvement of her symptoms. For the last 2 days her symptoms have been worsening, patient more confused and disoriented, poor oral intake anddecreased urine output. On her initial physical examination blood pressure 142/72, heart rate 90, respiratory rate20, temperature 98.3, oxygen saturation 94%, she had dry mucous membranes, her lungs had no significant rhonchi or wheezing, his abdomen was distended and tender to palpation, hypoactive bowel sounds,no lower extremity edema. Patient was awake and alert, but confused and disoriented. Nonfocal.  Sodium 138, potassium 4.9, chloride 108, bicarb 24, glucose 85, BUN 13, creatinine 0.66, white count 6.7, hemoglobin 7.8, hematocrit 24.7, platelets 86.  SARS COVID-19 negative.  Urine analysis negative for infection.  Head CT with progressive age-related cerebral atrophy, ventriculomegaly and periventricular white matter disease.  CT of the abdomen with splenomegaly 22.4 cm craniocaudal.  Unremarkable liver, positive large gallstones.  Chest radiograph with cardiomegaly, no infiltrates.  EKG 78 bpm, left axis deviation, sinus rhythm, left bundle branch block, multifocal PVCs, no ST segment changes or T wave abnormalities  Patient was admitted to the hospital working diagnosis of metabolic encephalopathy.   Assessment & Plan:   Principal  Problem:   Acute encephalopathy Active Problems:   Anemia, iron deficiency   Splenic marginal zone b-cell lymphoma (HCC)   Hypothyroidism   Abdominal tenderness   Pressure injury of skin   1. Acute metabolic encephalopathy. Continue to improve mentation, no hallucinations, no agitation. Will continue neuro checks per unit protocol,  nutritional assesment and physical therapy. Encephalopathy likely multifactorial, avoid dehydration, use diuretic therapy only as needed.   2. Diarrhea. Abdominal distention with improved diarrhea, tolerating po well.    3. Chronic anemia with thrombocytopenia/ positive splenomegaly, with possible bone marrow dysfunction. Cell counts have been stable with wbc at 5,0, Hgb at 7,8, Hct at 25 and platelet at 88. Conservative management due to advanced age and comorbid conditions. PRBC transfusion for Hgb less than 7.   4. Systolic heart failure. Stable with no signs of exacerbation.  Hypovolemia on admission, clinically improved but not yet back to baseline, will continue to encourage po intake and will hold on further IV fluids for now.   5. HTN. Continue with carvedilol and losartan for blood pressure control.  6, Hypothyroid. Continue levothyroxine, recent upgrade of dose due to elevated TSH.   7. Stage 1 sacrum pressure ulcer, present on admission. Continue with with local wound care.  8. Recent left hip fracture. Displaced periprosthetic fracture, recovering well with non operative management. Case discussed with Dr. Percell Miller today from orthopedics, patient is now ok for ambulation with no restriction, weight bearing.   DVT prophylaxis:scd Code Status:dnr Family Communication:I spoke with her daughter over the phone and all questions were addressed.  Disposition Plan/ discharge barriers:pending placement SNF.   Body mass index is 18.61 kg/m. Malnutrition Type:  Nutrition Problem: Inadequate oral intake Etiology: lethargy/confusion,  dysphagia   Malnutrition Characteristics:  Signs/Symptoms: meal completion < 50%   Nutrition Interventions:  Interventions:  Ensure Enlive (each supplement provides 350kcal and 20 grams of protein), Magic cup  RN Pressure Injury Documentation: Pressure Injury 06/19/18 Sacrum Medial Stage I -  Intact skin with non-blanchable redness of a localized area usually over a bony prominence. (Active)  06/19/18 2232  Location: Sacrum  Location Orientation: Medial  Staging: Stage I -  Intact skin with non-blanchable redness of a localized area usually over a bony prominence.  Wound Description (Comments):   Present on Admission: Yes     Consultants:     Procedures:     Antimicrobials:       Subjective: Patient is feeling better but continue to be very weak and deconditioned, has persistent thirst, no nausea or vomiting.   Objective: Vitals:   06/21/18 0415 06/21/18 1254 06/21/18 2006 06/22/18 0507  BP: (!) 127/58 101/73 (!) 122/42 133/62  Pulse: 66 74 (!) 55 71  Resp: 20 18 (!) 23 18  Temp: 98 F (36.7 C) 97.9 F (36.6 C) 97.9 F (36.6 C) 97.9 F (36.6 C)  TempSrc:  Oral Oral Oral  SpO2: 90% 90% 93% 92%  Weight:      Height:        Intake/Output Summary (Last 24 hours) at 06/22/2018 1151 Last data filed at 06/22/2018 1016 Gross per 24 hour  Intake 2276.26 ml  Output 228 ml  Net 2048.26 ml   Filed Weights   06/19/18 1451 06/19/18 2232  Weight: 51.3 kg 53.9 kg    Examination:   General: deconditioned and ill looking appearing  Neurology: Awake and alert, non focal  E ENT: mild pallor, no icterus, oral mucosa moist Cardiovascular: No JVD. S1-S2 present, rhythmic, no gallops, rubs, or murmurs. No lower extremity edema. Pulmonary: positve  breath sounds bilaterally, adequate air movement, no wheezing, rhonchi or rales. Gastrointestinal. Abdomen is distended with splenomegaly,  non tender, no rebound or guarding Skin. No rashes Musculoskeletal: no joint  deformities     Data Reviewed: I have personally reviewed following labs and imaging studies  CBC: Recent Labs  Lab 06/19/18 1640 06/20/18 0544 06/21/18 0602  WBC 6.7 6.2 5.0  NEUTROABS 1.6*  --  1.2*  HGB 7.8* 8.2* 7.8*  HCT 24.7* 26.6* 25.2*  MCV 96.9 100.8* 98.8  PLT 86* 87* 88*   Basic Metabolic Panel: Recent Labs  Lab 06/19/18 1640 06/21/18 0602  NA 138 141  K 4.9 3.4*  CL 108 111  CO2 24 24  GLUCOSE 85 100*  BUN 13 13  CREATININE 0.66 0.64  CALCIUM 7.1* 7.4*   GFR: Estimated Creatinine Clearance: 39 mL/min (by C-G formula based on SCr of 0.64 mg/dL). Liver Function Tests: Recent Labs  Lab 06/19/18 1640  AST 47*  ALT 14  ALKPHOS 78  BILITOT 0.9  PROT 5.7*  ALBUMIN 2.5*   No results for input(s): LIPASE, AMYLASE in the last 168 hours. Recent Labs  Lab 06/19/18 2259  AMMONIA 16   Coagulation Profile: No results for input(s): INR, PROTIME in the last 168 hours. Cardiac Enzymes: No results for input(s): CKTOTAL, CKMB, CKMBINDEX, TROPONINI in the last 168 hours. BNP (last 3 results) No results for input(s): PROBNP in the last 8760 hours. HbA1C: No results for input(s): HGBA1C in the last 72 hours. CBG: Recent Labs  Lab 06/21/18 1622 06/21/18 2001 06/22/18 0125 06/22/18 0501 06/22/18 0743  GLUCAP 111* 99 89 90 84   Lipid Profile: No results for input(s): CHOL, HDL, LDLCALC, TRIG, CHOLHDL, LDLDIRECT in the last 72 hours. Thyroid Function Tests: Recent Labs  06/19/18 1844  TSH 12.420*  FREET4 1.17   Anemia Panel: Recent Labs    06/19/18 [place]      Radiology Studies: I have reviewed all of the imaging during this hospital visit personally     Scheduled Meds: . carvedilol  3.125 mg Oral BID WC  . feeding supplement (ENSURE ENLIVE)  237 mL Oral BID BM  . levothyroxine  150 mcg Oral Q0600  . losartan  25 mg Oral Daily  . mouth rinse  15 mL Mouth Rinse BID   Continuous Infusions: . dextrose 5 % and 0.9%  NaCl 75 mL/hr at 06/22/18 0123     LOS: 3 days        Griffin Gerrard Gerome Apley, MD

## 2018-06-22 NOTE — Telephone Encounter (Signed)
Pt's medication was sent to pt's pharmacy as requested. Confirmation received.  °

## 2018-06-22 NOTE — Progress Notes (Signed)
Physical Therapy Treatment Patient Details Name: Kathleen Arias MRN: [id number] DOB: Jan 19, 1925 Today's Date: 06/22/2018    History of Present Illness 85 y.o. female with medical history significant of chronic combined systolic and diastolic CHF, hypertension, hypothyroidism, and recent closed left hip fracture from mechanical fall 04/2018.  Pt presenting to the hospital for evaluation of altered mental status    PT Comments    Pt is awake and oriented to current location, expressing her needs and following all commands.  Assisted to EOB. General bed mobility comments: HOB elevated and use of bed pad, asssisted pt to EOB.  Once upright, pt was able to static sit at Supervision level.   Assisted OOB to [facility].  General transfer comment: attempted sit to stand from elevated bed with walker however pt unable to fully upright and present with increased fear/anxiety.  So performed "Bear Hug" 1/4 pivot to [facility].  Pt tolerated well with minimal c/o pain but with increased anxiety/fear of falling esp during controlled stand to sit on BSC.  Same assist off back to EOB.  Pt was able to bear weight however required assist to pivot and complete turn.  General Gait Details: unable due to low tranfer ability.  Pt will need ST Rehab at SNF.   Follow Up Recommendations  SNF;Supervision/Assistance - 24 hour     Equipment Recommendations  None recommended by PT    Recommendations for Other Services       Precautions / Restrictions Precautions Precautions: Fall Precaution Comments: new order 06/22/2018 "activity as tolerated - no restrictions" per Dr Cathlean Sauer Restrictions Weight Bearing Restrictions: No LLE Weight Bearing: Weight bearing as tolerated    Mobility  Bed Mobility Overal bed mobility: Needs Assistance Bed Mobility: Supine to Sit;Sit to Supine     Supine to sit: Max assist Sit to supine: Max assist;+2 for physical assistance;+2 for safety/equipment   General bed mobility comments: HOB elevated  and use of bed pad, asssisted pt to EOB.  Once upright, pt was able to static sit at Supervision level.    Transfers Overall transfer level: Needs assistance Equipment used: None;Rolling walker (2 wheeled) Transfers: Sit to/from [employer] Transfers   Stand pivot transfers: Max assist;Total assist;+2 physical assistance;+2 safety/equipment;From elevated surface       General transfer comment: attempted sit to stand from elevated bed with walker however pt unable to fully upright and present with increased fear/anxiety.  So performed "Bear Hug" 1/4 pivot to [facility].  Pt tolerated well with minimal c/o pain but with increased anxiety/fear of falling esp during controlled stand to sit on BSC.  Same assist off back to EOB.  Pt was able to bear weight however required assist to pivot and complete turn.    Ambulation/Gait             General Gait Details: unable due to low tranfer ability.    Stairs             Wheelchair Mobility    Modified Rankin (Stroke Patients Only)       Balance                                            Cognition Arousal/Alertness: Awake/alert Behavior During Therapy: WFL for tasks assessed/performed Overall Cognitive Status: Within Functional Limits for tasks assessed  General Comments: pt AxO x 3 aware of current location, expressing needs, following all directions, mild anxiety with mobility      Exercises      General Comments        Pertinent Vitals/Pain Pain Assessment: Faces Faces Pain Scale: Hurts a little bit Pain Location: leg/back Pain Descriptors / Indicators: Grimacing Pain Intervention(s): Monitored during session;Repositioned    Home Living                      Prior Function            PT Goals (current goals can now be found in the care plan section) Progress towards PT goals: Progressing toward goals    Frequency    Min  2X/week      PT Plan Current plan remains appropriate    Co-evaluation              AM-PAC PT "6 Clicks" Mobility   Outcome Measure  Help needed turning from your back to your side while in a flat bed without using bedrails?: Total Help needed moving from lying on your back to sitting on the side of a flat bed without using bedrails?: Total Help needed moving to and from a bed to a chair (including a wheelchair)?: Total Help needed standing up from a chair using your arms (e.g., wheelchair or bedside chair)?: Total Help needed to walk in hospital room?: Total Help needed climbing 3-5 steps with a railing? : Total 6 Click Score: 6    End of Session Equipment Utilized During Treatment: Gait belt Activity Tolerance: Patient tolerated treatment well Patient left: in bed;with call bell/phone within reach;with bed alarm set Nurse Communication: Mobility status PT Visit Diagnosis: Other abnormalities of gait and mobility (R26.89);Adult, failure to thrive (R62.7)     Time: 8127-5170 PT Time Calculation (min) (ACUTE ONLY): 25 min  Charges:  $Therapeutic Activity: 23-37 mins                     Rica Koyanagi  PTA Acute  Rehabilitation Services Pager      [phone] Office      [phone]

## 2018-06-22 NOTE — TOC Progression Note (Signed)
Transition of Care [facility]) - Progression Note    Patient Details  Name: Kathleen Arias MRN: [id number] Date of Birth: 02-Jan-1926  [facility]) CM/SW Contact  Elbert Polyakov, Marjie Skiff, RN Phone Number: 06/22/2018, 4:01 PM  Clinical Narrative:    [facility] chosen. Approval for SNF auth received from [facility] #[id number]. Approval good from 06/22/2018 through 06/26/2018. Will need new Covid test prior to dc. MD made aware.                  Marney Doctor RN,BSN [phone]

## 2018-06-22 NOTE — Care Management Important Message (Signed)
Important Message  Patient Details IM Letter given to Sharren Bridge SW to present to the Patient Name: Kathleen Arias MRN: [id number] Date of Birth: [birth year]   Medicare Important Message Given:  Yes    Kerin Salen 06/22/2018, 10:19 AM

## 2018-06-23 LAB — GLUCOSE, CAPILLARY
Glucose-Capillary: 62 mg/dL — ABNORMAL LOW (ref 70–99)
Glucose-Capillary: 63 mg/dL — ABNORMAL LOW (ref 70–99)
Glucose-Capillary: 64 mg/dL — ABNORMAL LOW (ref 70–99)
Glucose-Capillary: 74 mg/dL (ref 70–99)
Glucose-Capillary: 83 mg/dL (ref 70–99)
Glucose-Capillary: 95 mg/dL (ref 70–99)

## 2018-06-23 MED ORDER — ENSURE ENLIVE PO LIQD: 237.0000 mL | Freq: Two times a day (BID) | ORAL | 0 refills | Status: AC

## 2018-06-23 MED ORDER — TORSEMIDE 10 MG PO TABS: 10.0000 mg | ORAL_TABLET | Freq: Every day | ORAL | 3 refills | Status: AC | PRN

## 2018-06-23 NOTE — Progress Notes (Signed)
Hypoglycemic Event  CBG: 62  Treatment: 4 oz juice/soda  Symptoms: None  Follow-up CBG: Time:1155 CBG Result:63  Possible Reasons for Event: Unknown  Comments/MD notified:MD aware. Seems to be pt baseline. No symptoms.Will continue to monitor.    Rosana Fret

## 2018-06-23 NOTE — Discharge Summary (Addendum)
Physician Discharge Summary  Yukari Flax LNL:[id number] DOB: 1925/12/03 DOA: 06/19/2018  PCP: [name], MD  Admit date: 06/19/2018 Discharge date: 06/23/2018  Admitted From: Home  Disposition:  Home  Recommendations for Outpatient Follow-up and new medication changes:  1. Follow up with Dr. Redmond Pulling in 7 days.  2. Torsemide has been change to as needed for fluid overload, 3 lbs wight increase in 24 H or 5 lbs weight increase in 7 days.   Home Health: yes Equipment/Devices: na    Discharge Condition: stable  CODE STATUS: DNR   Diet recommendation:  Heart healthy   Brief/Interim Summary: 85 year old female who presented with altered mental status. She does have the significant past medical history for systolic heart failure, hypertension andhypothyroidism.Patient had a recent hip fracture about a month ago that required skilled nursing facility care. She was posteriorly discharged home where she was diagnosed with urinary tract infection, treated with antibiotic therapy. She was noted to have hallucinations and confusions, her primary care provider prescribed olanzapine with mild improvement of her symptoms. For the last 2 days her symptoms have been worsening, patient more confused and disoriented, poor oral intake anddecreased urine output. On her initial physical examination blood pressure 142/72, heart rate 90, respiratory rate20, temperature 98.3, oxygen saturation 94%, she had dry mucous membranes, her lungs had no significant rhonchi or wheezing, his abdomen was distended and tender to palpation, hypoactive bowel sounds,no lower extremity edema. Patient was awake and alert, but confused and disoriented. Nonfocal.  Sodium 138, potassium 4.9, chloride 108, bicarb 24, glucose 85, BUN 13, creatinine 0.66, white count 6.7, hemoglobin 7.8, hematocrit 24.7, platelets 86.  SARS COVID-19 negative.  Urine analysis negative for infection.  Head CT with progressive age-related cerebral  atrophy, ventriculomegaly and periventricular white matter disease.  CT of the abdomen with splenomegaly 22.4 cm craniocaudal.  Unremarkable liver, positive large gallstones.  Chest radiograph with cardiomegaly, no infiltrates.  EKG 78 bpm, left axis deviation, sinus rhythm, left bundle branch block, multifocal PVCs, no ST segment changes or T wave abnormalities  Patient was admitted to the hospital working diagnosis of metabolic encephalopathy.  1.  Acute metabolic encephalopathy due to dehydration.  Patient was admitted to the medical ward, she received supportive medical therapy with IV fluids, frequent neuro checks and nutritional assessment.  Patient responded well to medical therapy, no further hallucinations, no agitation or confusion.   2.  Diarrhea.  Likely multifactorial, by the time of discharge it has resolved.  3.  Chronic anemia with iron deficiency, thrombocytopenia, positive splenomegaly, splenic marginal zone lymphoma.  Patient follow-ups with the oncology clinic, she had Rituxan weekly December 2019.  She has persistent splenomegaly.  Her cell counts have been stable with a discharge white count of 5.0, hemoglobin 7.8, hematocrit 25.2 and platelets 88.    4.  Systolic heart failure.  Last echocardiogram showed a left ventricle ejection fraction 20 to 25% with diffuse hypokinesis.  Patient tolerated well IV fluids with no signs of acute decompensation.  Patient will continue taking torsemide only as needed.  Continue carvedilol and losartan.  5.  Hypertension.  Her blood pressure remained well controlled, continue losartan and carvedilol.  6.  Stage I sacral decubitus ulcer, present on admission.  Continue local wound care.  7.  Recent left hip fracture.  Patient had a displaced periprosthetic fracture, she has been recovering well, with conservative nonoperative management.  Patient now has been cleared for ambulation and weightbearing per Dr. Percell Miller from orthopedics.  8.  Hypothyroidism.  Her TSH mildly elevated, no frank hypothyroid symptoms, his dose was recently changed.   9. Underweight. Patient with BMI less than 18,6, continue nutritional support.   Discharge Diagnoses:  Principal Problem:   Acute encephalopathy Active Problems:   Anemia, iron deficiency   Splenic marginal zone b-cell lymphoma (HCC)   Hypothyroidism   Abdominal tenderness   Pressure injury of skin    Discharge Instructions   Allergies as of 06/23/2018   No Known Allergies     Medication List    STOP taking these medications   iron polysaccharides 150 MG capsule Commonly known as:  NIFEREX     TAKE these medications   acetaminophen 500 MG tablet Commonly known as:  TYLENOL Take 500 mg by mouth every 4 (four) hours as needed for mild pain or moderate pain.   carvedilol 6.25 MG tablet Commonly known as:  COREG Take 0.5 tablets (3.125 mg total) by mouth 2 (two) times daily with a meal. What changed:    how much to take  when to take this   feeding supplement (ENSURE ENLIVE) Liqd Take 237 mLs by mouth 2 (two) times daily between meals for 30 days.   GNP B-COMPLEX PLUS VITAMIN C PO Give one by mouth daily for supplement   levothyroxine 150 MCG tablet Commonly known as:  SYNTHROID Take 1 tablet (150 mcg total) by mouth daily before breakfast. For Hypothyroidism What changed:  additional instructions   losartan 25 MG tablet Commonly known as:  COZAAR TAKE 1 TABLET(25 MG) BY MOUTH DAILY What changed:  See the new instructions.   PreserVision AREDS 2 Caps Give one by mouth daily for supplement   torsemide 10 MG tablet Commonly known as:  DEMADEX Take 1 tablet (10 mg total) by mouth daily as needed (as needed for swelling, weight gain 3 lbs in 24 hours or 5 lbs in 7 days.). What changed:    when to take this  reasons to take this      Contact information for after-discharge care    Destination    [name] .   Service:  Skilled  Nursing Contact information: [place] [phone]             No Known Allergies  Consultations:     Procedures/Studies: Ct Abdomen Pelvis Wo Contrast  Result Date: 06/19/2018 CLINICAL DATA:  Bowel obstruction. EXAM: CT ABDOMEN AND PELVIS WITHOUT CONTRAST TECHNIQUE: Multidetector CT imaging of the abdomen and pelvis was performed following the standard protocol without IV contrast. COMPARISON:  None. FINDINGS: Lower chest: The heart size is enlarged. The intracardiac blood pool is hypodense relative to the adjacent myocardium consistent with anemia. There are trace bilateral pleural effusions, right greater than left. Hepatobiliary: Large gallstones are noted. The liver is unremarkable. Pancreas: The pancreas is suboptimally evaluated secondary to lack of IV contrast. The visualized portions appear unremarkable. Spleen: The spleen is significantly enlarged measuring approximately 22.4 cm craniocaudad. This is significantly increased from prior study when it measured approximately 18 cm craniocaudad. There are small 1.3 cm hypoattenuating nodules that are suboptimally evaluated on this exam. Adrenals/Urinary Tract: Adrenal glands are unremarkable. Kidneys are normal, without renal calculi, focal lesion, or hydronephrosis. Bladder is unremarkable. Stomach/Bowel: There is a large amount of stool throughout the colon. There is scattered colonic diverticula there is no CT evidence of diverticulitis or colitis. There is no evidence of a small-bowel obstruction. The appendix is not reliably identified. Vascular/Lymphatic: Aortic atherosclerosis.  No enlarged abdominal or pelvic lymph nodes. Reproductive: Status post hysterectomy. No adnexal masses. Other: There is a small amount of free fluid in the abdomen and pelvis. This has increased from prior study. There is diffuse body wall edema. There is a small amount of free fluid in the right inguinal canal.  Musculoskeletal: The patient is status post prior left hip arthroplasty. There is stable height loss of the T11 vertebral body. Again identified is a periprosthetic fracture of the proximal left hip. IMPRESSION: 1. Very limited study secondary to motion artifact and lack of IV contrast. 2. No evidence of a small-bowel obstruction. 3. Significant interval increase in size of the spleen as detailed above. 4. Interval development of small volume ascites. 5. Cardiomegaly with small bilateral pleural effusions. There is anemia. 6. Anasarca. 7. Again noted is a periprosthetic fracture involving the proximal left femur, better visualized on prior CT from 05/09/2018. Electronically Signed   By: Constance Holster M.D.   On: 06/19/2018 22:32   Ct Head Wo Contrast  Result Date: 06/19/2018 CLINICAL DATA:  Altered mental status.  Patient has severe dementia. EXAM: CT HEAD WITHOUT CONTRAST TECHNIQUE: Contiguous axial images were obtained from the base of the skull through the vertex without intravenous contrast. COMPARISON:  Head CT 08/21/2010 FINDINGS: Brain: Progressive age related cerebral atrophy, ventriculomegaly and periventricular white matter disease. No extra-axial fluid collections are identified. No CT findings for acute hemispheric infarction or intracranial hemorrhage. No mass lesions. The brainstem and cerebellum are normal. Vascular: Vascular calcifications but no definite aneurysm or hyperdense vessels. Skull: No skull fracture or bone lesions. Sinuses/Orbits: The paranasal sinuses and mastoid air cells are clear except for a small mucous retention cyst or polyp in left maxillary sinus. The globes are intact. Other: No scalp lesions or hematoma. IMPRESSION: 1. Progressive age related cerebral atrophy, ventriculomegaly and periventricular white matter disease. 2. No acute intracranial findings or mass lesions. Electronically Signed   By: Marijo Sanes M.D.   On: 06/19/2018 18:00   Dg Chest Portable 1  View  Result Date: 06/19/2018 CLINICAL DATA:  Cough and confusion EXAM: PORTABLE CHEST 1 VIEW COMPARISON:  Chest x-ray dated 05/09/2018 FINDINGS: The cardiac silhouette is enlarged. There is mild vascular congestion without overt pulmonary edema. The lung volumes are somewhat low. Aortic calcifications are noted. There is no acute osseous abnormality. IMPRESSION: No active disease. Electronically Signed   By: Constance Holster M.D.   On: 06/19/2018 18:58      Procedures:   Subjective: Patient is feeling better, improve po intake, no further hallucinations, confusion or agitation. Continue to be very weak and deconditioned.   Discharge Exam: Vitals:   06/22/18 2037 06/23/18 0402  BP: (!) 122/50 (!) 124/54  Pulse: 67 75  Resp: 19 19  Temp: 97.8 F (36.6 C) 98 F (36.7 C)  SpO2: 91% 95%   Vitals:   06/22/18 0507 06/22/18 1317 06/22/18 2037 06/23/18 0402  BP: 133/62 (!) 126/52 (!) 122/50 (!) 124/54  Pulse: 71 67 67 75  Resp: 18 (!) 22 19 19   Temp: 97.9 F (36.6 C) 98 F (36.7 C) 97.8 F (36.6 C) 98 F (36.7 C)  TempSrc: Oral Oral Oral Oral  SpO2: 92% 94% 91% 95%  Weight:      Height:        General: Not in pain or dyspnea, deconditioned  Neurology: Awake and alert, non focal  E ENT: mild pallor, no icterus, oral mucosa moist Cardiovascular: No JVD. S1-S2 present, rhythmic, no gallops,  rubs, or murmurs. No lower extremity edema. Pulmonary: vesicular breath sounds bilaterally, adequate air movement, no wheezing, rhonchi or rales. Gastrointestinal. Abdomen with no organomegaly, non tender, no rebound or guarding Skin. No rashes Musculoskeletal: no joint deformities   The results of significant diagnostics from this hospitalization (including imaging, microbiology, ancillary and laboratory) are listed below for reference.     Microbiology: Recent Results (from the past 240 hour(s))  SARS Coronavirus 2 (CEPHEID - Performed in [place] hospital lab), Hosp Order      Status: None   Collection Time: 06/19/18  6:09 PM  Result Value Ref Range Status   SARS Coronavirus 2 NEGATIVE NEGATIVE Final    Comment: (NOTE) If result is NEGATIVE SARS-CoV-2 target nucleic acids are NOT DETECTED. The SARS-CoV-2 RNA is generally detectable in upper and lower  respiratory specimens during the acute phase of infection. The lowest  concentration of SARS-CoV-2 viral copies this assay can detect is 250  copies / mL. A negative result does not preclude SARS-CoV-2 infection  and should not be used as the sole basis for treatment or other  patient management decisions.  A negative result may occur with  improper specimen collection / handling, submission of specimen other  than nasopharyngeal swab, presence of viral mutation(s) within the  areas targeted by this assay, and inadequate number of viral copies  (<250 copies / mL). A negative result must be combined with clinical  observations, patient history, and epidemiological information. If result is POSITIVE SARS-CoV-2 target nucleic acids are DETECTED. The SARS-CoV-2 RNA is generally detectable in upper and lower  respiratory specimens dur ing the acute phase of infection.  Positive  results are indicative of active infection with SARS-CoV-2.  Clinical  correlation with patient history and other diagnostic information is  necessary to determine patient infection status.  Positive results do  not rule out bacterial infection or co-infection with other viruses. If result is PRESUMPTIVE POSTIVE SARS-CoV-2 nucleic acids MAY BE PRESENT.   A presumptive positive result was obtained on the submitted specimen  and confirmed on repeat testing.  While 2019 novel coronavirus  (SARS-CoV-2) nucleic acids may be present in the submitted sample  additional confirmatory testing may be necessary for epidemiological  and / or clinical management purposes  to differentiate between  SARS-CoV-2 and other Sarbecovirus currently known to  infect humans.  If clinically indicated additional testing with an alternate test  methodology [phone]) is advised. The SARS-CoV-2 RNA is generally  detectable in upper and lower respiratory sp ecimens during the acute  phase of infection. The expected result is Negative. Fact Sheet for Patients:  StrictlyIdeas.no Fact Sheet for Healthcare Providers: BankingDealers.co.za This test is not yet approved or cleared by the [place] FDA and has been authorized for detection and/or diagnosis of SARS-CoV-2 by FDA under an Emergency Use Authorization (EUA).  This EUA will remain in effect (meaning this test can be used) for the duration of the COVID-19 declaration under Section 564(b)(1) of the Act, 21 U.S.C. section 360bbb-3(b)(1), unless the authorization is terminated or revoked sooner. Performed at [facility], [place] [address] [place], [place] [address]   SARS Coronavirus 2 (CEPHEID - Performed in [place] hospital lab), Hosp Order     Status: None   Collection Time: 06/22/18  6:44 PM  Result Value Ref Range Status   SARS Coronavirus 2 NEGATIVE NEGATIVE Final    Comment: (NOTE) If result is NEGATIVE SARS-CoV-2 target nucleic acids are NOT DETECTED. The SARS-CoV-2 RNA  is generally detectable in upper and lower  respiratory specimens during the acute phase of infection. The lowest  concentration of SARS-CoV-2 viral copies this assay can detect is 250  copies / mL. A negative result does not preclude SARS-CoV-2 infection  and should not be used as the sole basis for treatment or other  patient management decisions.  A negative result may occur with  improper specimen collection / handling, submission of specimen other  than nasopharyngeal swab, presence of viral mutation(s) within the  areas targeted by this assay, and inadequate number of viral copies  (<250 copies / mL). A negative result must be combined with  clinical  observations, patient history, and epidemiological information. If result is POSITIVE SARS-CoV-2 target nucleic acids are DETECTED. The SARS-CoV-2 RNA is generally detectable in upper and lower  respiratory specimens dur ing the acute phase of infection.  Positive  results are indicative of active infection with SARS-CoV-2.  Clinical  correlation with patient history and other diagnostic information is  necessary to determine patient infection status.  Positive results do  not rule out bacterial infection or co-infection with other viruses. If result is PRESUMPTIVE POSTIVE SARS-CoV-2 nucleic acids MAY BE PRESENT.   A presumptive positive result was obtained on the submitted specimen  and confirmed on repeat testing.  While 2019 novel coronavirus  (SARS-CoV-2) nucleic acids may be present in the submitted sample  additional confirmatory testing may be necessary for epidemiological  and / or clinical management purposes  to differentiate between  SARS-CoV-2 and other Sarbecovirus currently known to infect humans.  If clinically indicated additional testing with an alternate test  methodology [phone]) is advised. The SARS-CoV-2 RNA is generally  detectable in upper and lower respiratory sp ecimens during the acute  phase of infection. The expected result is Negative. Fact Sheet for Patients:  StrictlyIdeas.no Fact Sheet for Healthcare Providers: BankingDealers.co.za This test is not yet approved or cleared by the [place] FDA and has been authorized for detection and/or diagnosis of SARS-CoV-2 by FDA under an Emergency Use Authorization (EUA).  This EUA will remain in effect (meaning this test can be used) for the duration of the COVID-19 declaration under Section 564(b)(1) of the Act, 21 U.S.C. section 360bbb-3(b)(1), unless the authorization is terminated or revoked sooner. Performed at [facility],  [place] [address] [place], [place] [address]      Labs: BNP (last 3 results) Recent Labs    03/01/18 2337  BNP 656.8*   Basic Metabolic Panel: Recent Labs  Lab 06/19/18 1640 06/21/18 0602  NA 138 141  K 4.9 3.4*  CL 108 111  CO2 24 24  GLUCOSE 85 100*  BUN 13 13  CREATININE 0.66 0.64  CALCIUM 7.1* 7.4*   Liver Function Tests: Recent Labs  Lab 06/19/18 1640  AST 47*  ALT 14  ALKPHOS 78  BILITOT 0.9  PROT 5.7*  ALBUMIN 2.5*   No results for input(s): LIPASE, AMYLASE in the last 168 hours. Recent Labs  Lab 06/19/18 2259  AMMONIA 16   CBC: Recent Labs  Lab 06/19/18 1640 06/20/18 0544 06/21/18 0602  WBC 6.7 6.2 5.0  NEUTROABS 1.6*  --  1.2*  HGB 7.8* 8.2* 7.8*  HCT 24.7* 26.6* 25.2*  MCV 96.9 100.8* 98.8  PLT 86* 87* 88*   Cardiac Enzymes: No results for input(s): CKTOTAL, CKMB, CKMBINDEX, TROPONINI in the last 168 hours. BNP: Invalid input(s): POCBNP CBG: Recent Labs  Lab 06/22/18 2033 06/23/18 0048 06/23/18 0359 06/23/18  6440 06/23/18 0742  GLUCAP 105* 95 83 64* 74   D-Dimer No results for input(s): DDIMER in the last 72 hours. Hgb A1c No results for input(s): HGBA1C in the last 72 hours. Lipid Profile No results for input(s): CHOL, HDL, LDLCALC, TRIG, CHOLHDL, LDLDIRECT in the last 72 hours. Thyroid function studies No results for input(s): TSH, T4TOTAL, T3FREE, THYROIDAB in the last 72 hours.  Invalid input(s): FREET3 Anemia work up No results for input(s): VITAMINB12, FOLATE, FERRITIN, TIBC, IRON, RETICCTPCT in the last 72 hours. Urinalysis    Component Value Date/Time   COLORURINE AMBER (A) 06/19/2018 1652   APPEARANCEUR HAZY (A) 06/19/2018 1652   LABSPEC 1.021 06/19/2018 1652   PHURINE 6.0 06/19/2018 1652   GLUCOSEU NEGATIVE 06/19/2018 1652   HGBUR NEGATIVE 06/19/2018 1652   BILIRUBINUR NEGATIVE 06/19/2018 1652   KETONESUR NEGATIVE 06/19/2018 1652   PROTEINUR 30 (A) 06/19/2018 1652   UROBILINOGEN 0.2 08/21/2010 1229    NITRITE NEGATIVE 06/19/2018 1652   LEUKOCYTESUR NEGATIVE 06/19/2018 1652   Sepsis Labs Invalid input(s): PROCALCITONIN,  WBC,  LACTICIDVEN Microbiology Recent Results (from the past 240 hour(s))  SARS Coronavirus 2 (CEPHEID - Performed in [place] hospital lab), Hosp Order     Status: None   Collection Time: 06/19/18  6:09 PM  Result Value Ref Range Status   SARS Coronavirus 2 NEGATIVE NEGATIVE Final    Comment: (NOTE) If result is NEGATIVE SARS-CoV-2 target nucleic acids are NOT DETECTED. The SARS-CoV-2 RNA is generally detectable in upper and lower  respiratory specimens during the acute phase of infection. The lowest  concentration of SARS-CoV-2 viral copies this assay can detect is 250  copies / mL. A negative result does not preclude SARS-CoV-2 infection  and should not be used as the sole basis for treatment or other  patient management decisions.  A negative result may occur with  improper specimen collection / handling, submission of specimen other  than nasopharyngeal swab, presence of viral mutation(s) within the  areas targeted by this assay, and inadequate number of viral copies  (<250 copies / mL). A negative result must be combined with clinical  observations, patient history, and epidemiological information. If result is POSITIVE SARS-CoV-2 target nucleic acids are DETECTED. The SARS-CoV-2 RNA is generally detectable in upper and lower  respiratory specimens dur ing the acute phase of infection.  Positive  results are indicative of active infection with SARS-CoV-2.  Clinical  correlation with patient history and other diagnostic information is  necessary to determine patient infection status.  Positive results do  not rule out bacterial infection or co-infection with other viruses. If result is PRESUMPTIVE POSTIVE SARS-CoV-2 nucleic acids MAY BE PRESENT.   A presumptive positive result was obtained on the submitted specimen  and confirmed on repeat testing.   While 2019 novel coronavirus  (SARS-CoV-2) nucleic acids may be present in the submitted sample  additional confirmatory testing may be necessary for epidemiological  and / or clinical management purposes  to differentiate between  SARS-CoV-2 and other Sarbecovirus currently known to infect humans.  If clinically indicated additional testing with an alternate test  methodology [phone]) is advised. The SARS-CoV-2 RNA is generally  detectable in upper and lower respiratory sp ecimens during the acute  phase of infection. The expected result is Negative. Fact Sheet for Patients:  StrictlyIdeas.no Fact Sheet for Healthcare Providers: BankingDealers.co.za This test is not yet approved or cleared by the [place] FDA and has been authorized for detection and/or diagnosis of SARS-CoV-2 by  FDA under an Emergency Use Authorization (EUA).  This EUA will remain in effect (meaning this test can be used) for the duration of the COVID-19 declaration under Section 564(b)(1) of the Act, 21 U.S.C. section 360bbb-3(b)(1), unless the authorization is terminated or revoked sooner. Performed at [facility], [place] [address] [place], [place] [address]   SARS Coronavirus 2 (CEPHEID - Performed in [place] hospital lab), Hosp Order     Status: None   Collection Time: 06/22/18  6:44 PM  Result Value Ref Range Status   SARS Coronavirus 2 NEGATIVE NEGATIVE Final    Comment: (NOTE) If result is NEGATIVE SARS-CoV-2 target nucleic acids are NOT DETECTED. The SARS-CoV-2 RNA is generally detectable in upper and lower  respiratory specimens during the acute phase of infection. The lowest  concentration of SARS-CoV-2 viral copies this assay can detect is 250  copies / mL. A negative result does not preclude SARS-CoV-2 infection  and should not be used as the sole basis for treatment or other  patient management decisions.  A negative result  may occur with  improper specimen collection / handling, submission of specimen other  than nasopharyngeal swab, presence of viral mutation(s) within the  areas targeted by this assay, and inadequate number of viral copies  (<250 copies / mL). A negative result must be combined with clinical  observations, patient history, and epidemiological information. If result is POSITIVE SARS-CoV-2 target nucleic acids are DETECTED. The SARS-CoV-2 RNA is generally detectable in upper and lower  respiratory specimens dur ing the acute phase of infection.  Positive  results are indicative of active infection with SARS-CoV-2.  Clinical  correlation with patient history and other diagnostic information is  necessary to determine patient infection status.  Positive results do  not rule out bacterial infection or co-infection with other viruses. If result is PRESUMPTIVE POSTIVE SARS-CoV-2 nucleic acids MAY BE PRESENT.   A presumptive positive result was obtained on the submitted specimen  and confirmed on repeat testing.  While 2019 novel coronavirus  (SARS-CoV-2) nucleic acids may be present in the submitted sample  additional confirmatory testing may be necessary for epidemiological  and / or clinical management purposes  to differentiate between  SARS-CoV-2 and other Sarbecovirus currently known to infect humans.  If clinically indicated additional testing with an alternate test  methodology [phone]) is advised. The SARS-CoV-2 RNA is generally  detectable in upper and lower respiratory sp ecimens during the acute  phase of infection. The expected result is Negative. Fact Sheet for Patients:  StrictlyIdeas.no Fact Sheet for Healthcare Providers: BankingDealers.co.za This test is not yet approved or cleared by the [place] FDA and has been authorized for detection and/or diagnosis of SARS-CoV-2 by FDA under an Emergency Use Authorization (EUA).   This EUA will remain in effect (meaning this test can be used) for the duration of the COVID-19 declaration under Section 564(b)(1) of the Act, 21 U.S.C. section 360bbb-3(b)(1), unless the authorization is terminated or revoked sooner. Performed at [facility], [place] [address], [place], [place] [address]      Time coordinating discharge: 45 minutes  SIGNED:   Tawni Millers, MD  [facility] 06/23/2018, 10:27 AM

## 2018-06-23 NOTE — Progress Notes (Signed)
Hypoglycemic Event  CBG: 64  Treatment: 4 oz juice/soda  Symptoms: None  Follow-up CBG: Time:0742 CBG Result:74  Possible Reasons for Event: Unknown  Comments/MD notified: MD aware. Will continue to monitor.    Rosana Fret

## 2018-06-23 NOTE — TOC Transition Note (Signed)
Transition of Care [facility]) - CM/SW Discharge Note   Patient Details  Name: Malkia Nippert MRN: [id number] Date of Birth: [birth year]  [facility] CM/SW Contact:  Wende Neighbors, LCSW Phone Number: 06/23/2018, 11:36 AM   Clinical Narrative:   CSW spoke to patients daughter via phone. Patient daughter Olin Hauser stated she does not want patient to discharge to a SNF but would prefer patient to go home with home health. CSW resumed patient home health with [facility] (PT,Ot, RN). Olin Hauser requested patient be transported home via [facility]    Final next level of care: [place] Barriers to Discharge: No Barriers Identified   Patient Goals and CMS Choice Patient states their goals for this hospitalization and ongoing recovery are:: get stronger CMS Medicare.gov Compare Post Acute Care list provided to:: Patient Represenative (must comment) Choice offered to / list presented to : Adult Children  Discharge Placement                Patient to be transferred to facility by: ptar Name of family member notified: spoke with daughter via phone Patient and family notified of of transfer: 06/23/18  Discharge Plan and Services   Discharge Planning Services: CM Consult Post Acute Care Choice: [place]: [facility]) Date [place]: 06/23/18 Time [place]: 26 Representative spoke with at [place]: spoke with [place]  Social Determinants of Health ([place]) Interventions     Readmission Risk Interventions No flowsheet data found.

## 2018-06-28 ENCOUNTER — Telehealth: Payer: Self-pay | Admitting: Adult Health Nurse Practitioner

## 2018-06-28 NOTE — Telephone Encounter (Signed)
Spoke with daughter regarding scheduling the Palliative Consult, she stated that they are in the process of transitioning her Mom to [facility] services today. Notified Dr. Josph Macho Wilson's office of this as well.

## 2018-07-19 ENCOUNTER — Other Ambulatory Visit: Payer: Self-pay | Admitting: Internal Medicine

## 2018-09-18 DEATH — deceased

## 2021-02-05 IMAGING — CR LEFT FEMUR 2 VIEWS
4 series · 4 of 4 positions shown · non-contrast
Comparison: CT scan of May 19, 2017.

CLINICAL DATA: Left hip pain after fall today.

EXAM:
LEFT FEMUR 2 VIEWS

[femur ap (1 of 2)]
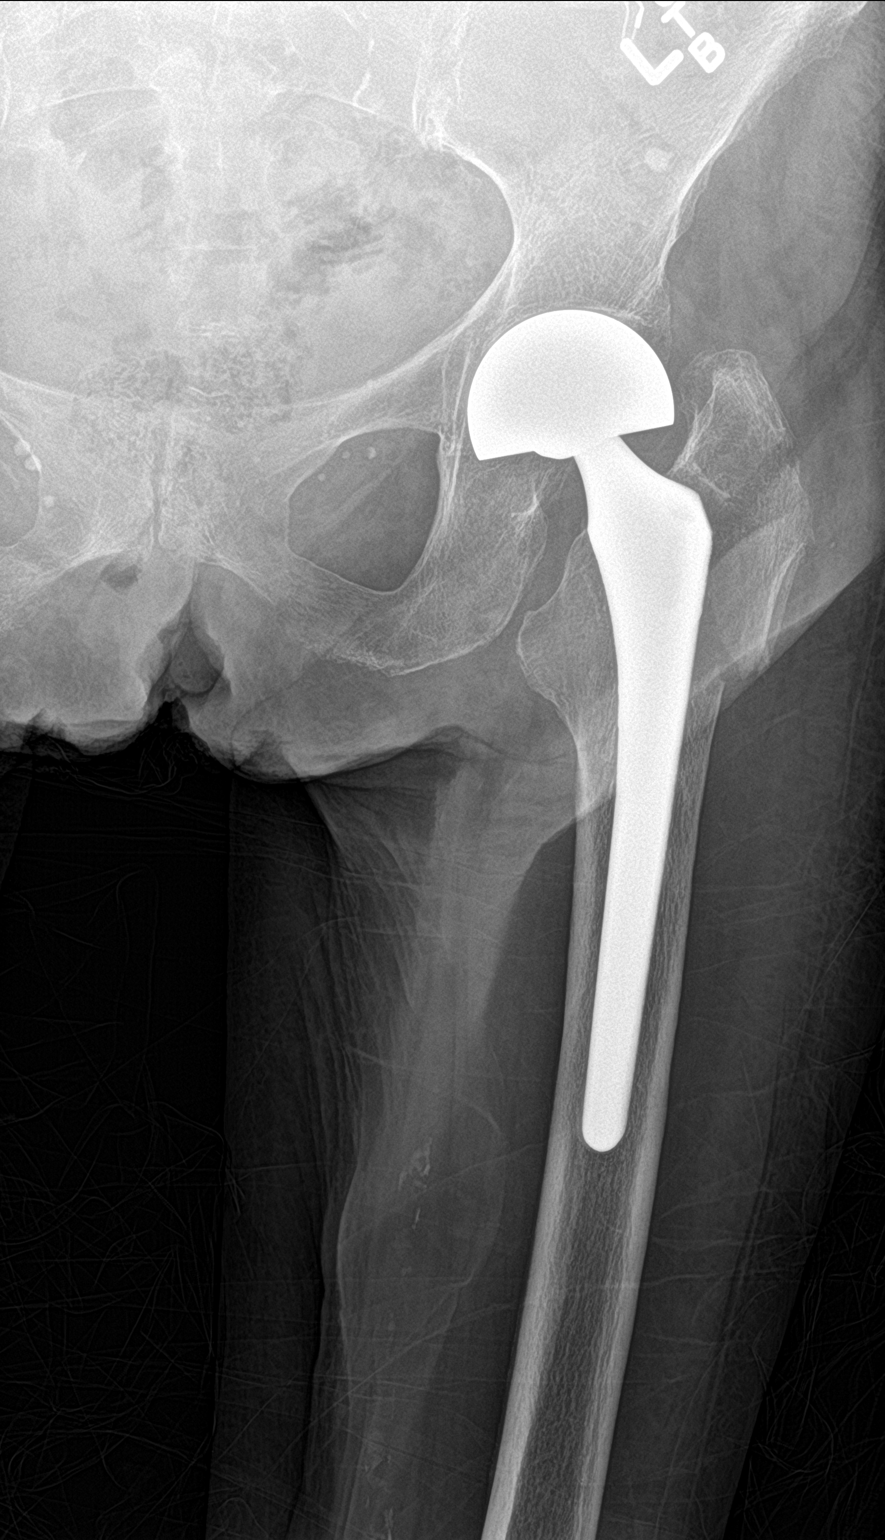

[femur ap (2 of 2)]
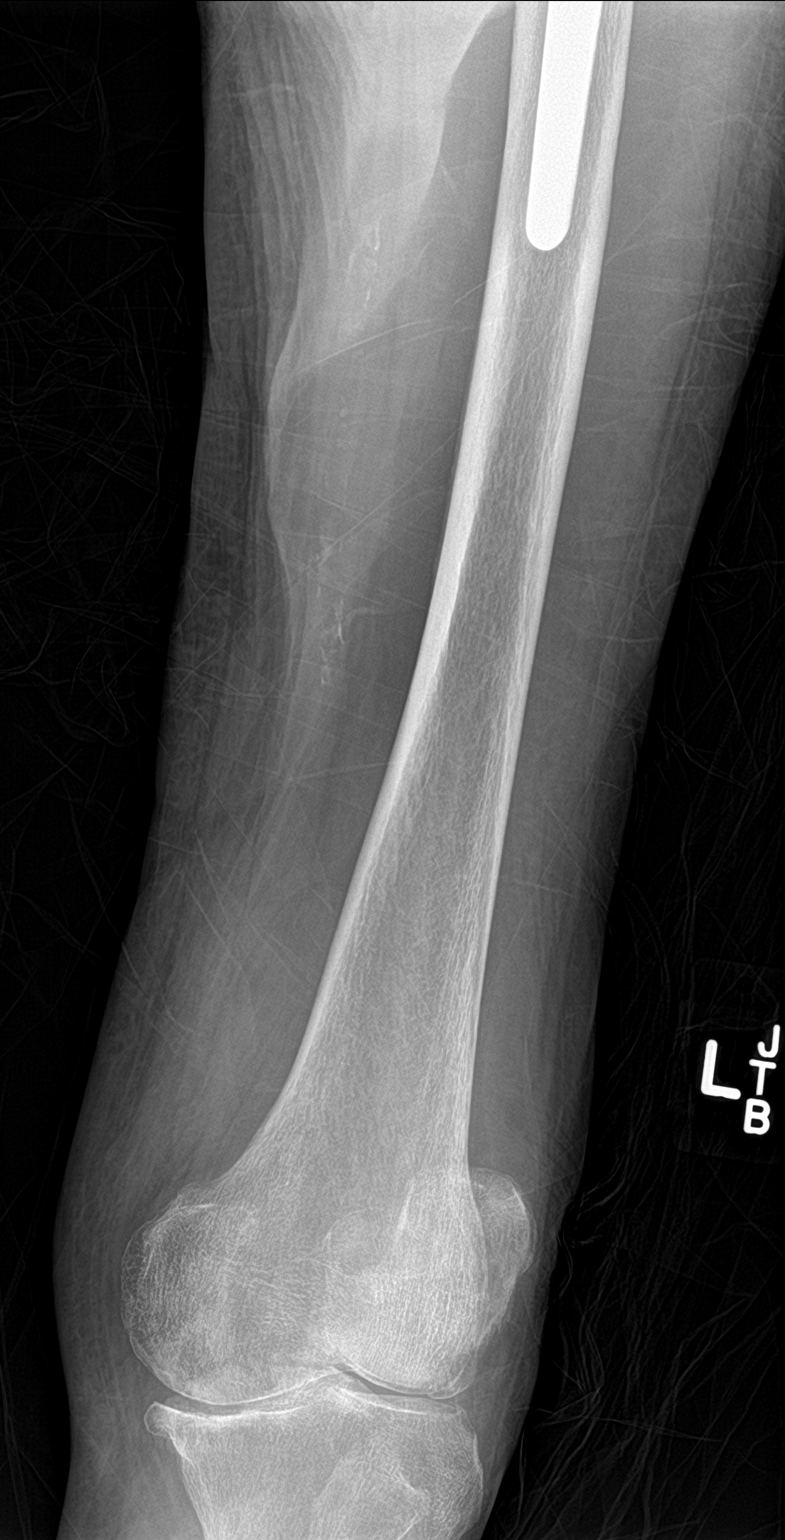

[femur lat (1 of 2)]
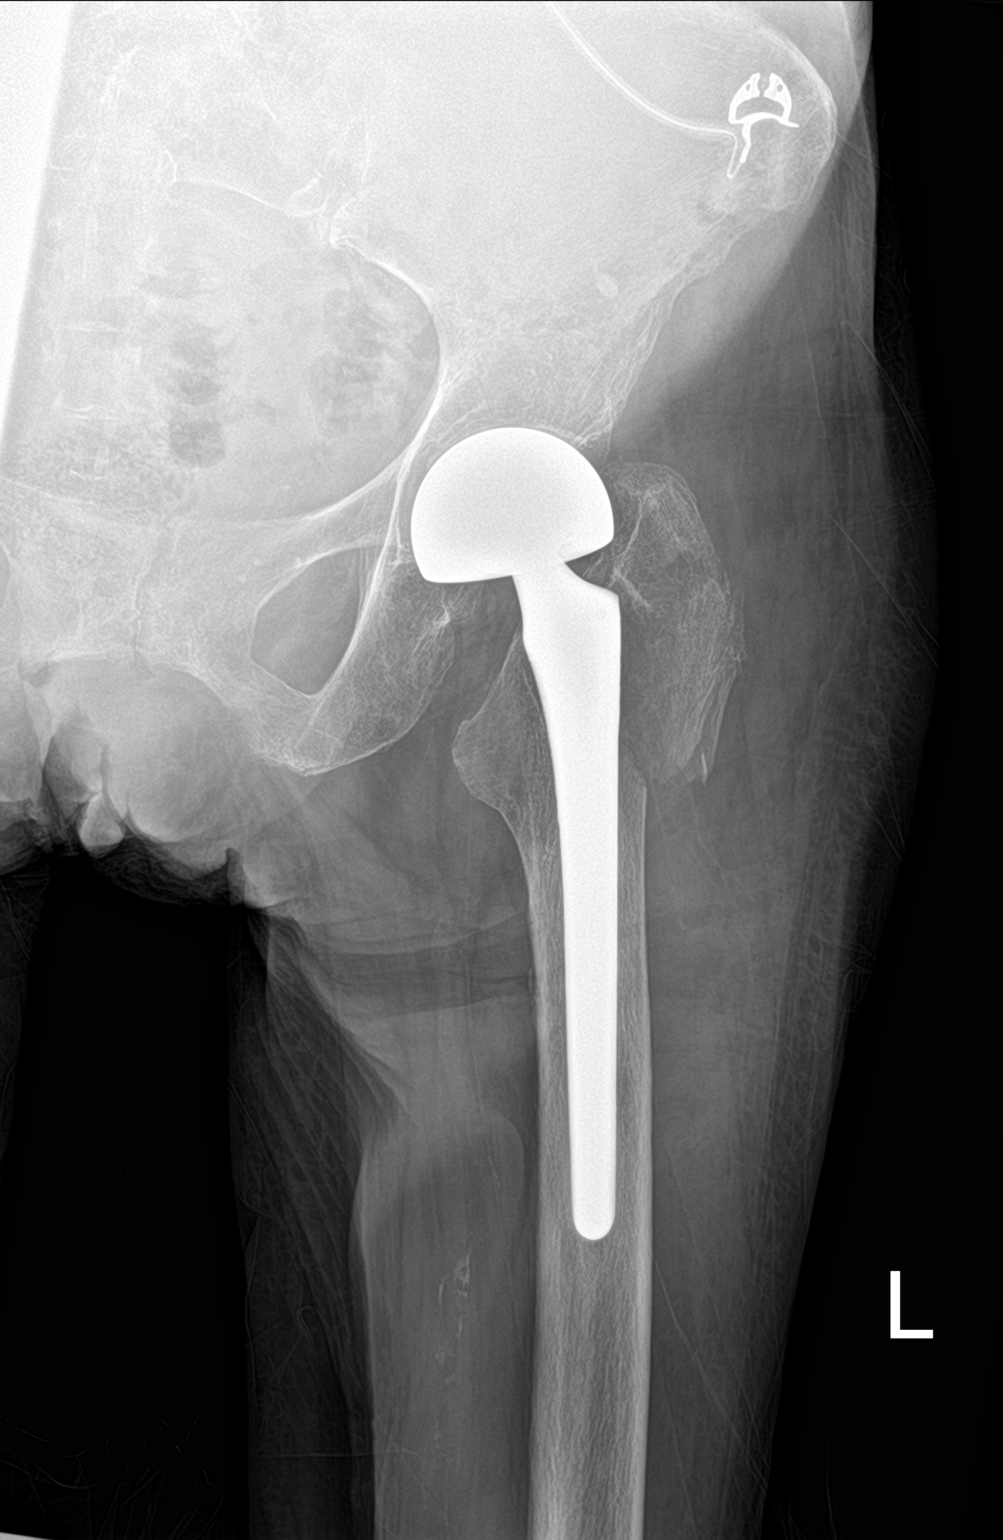

[femur lat (2 of 2)]
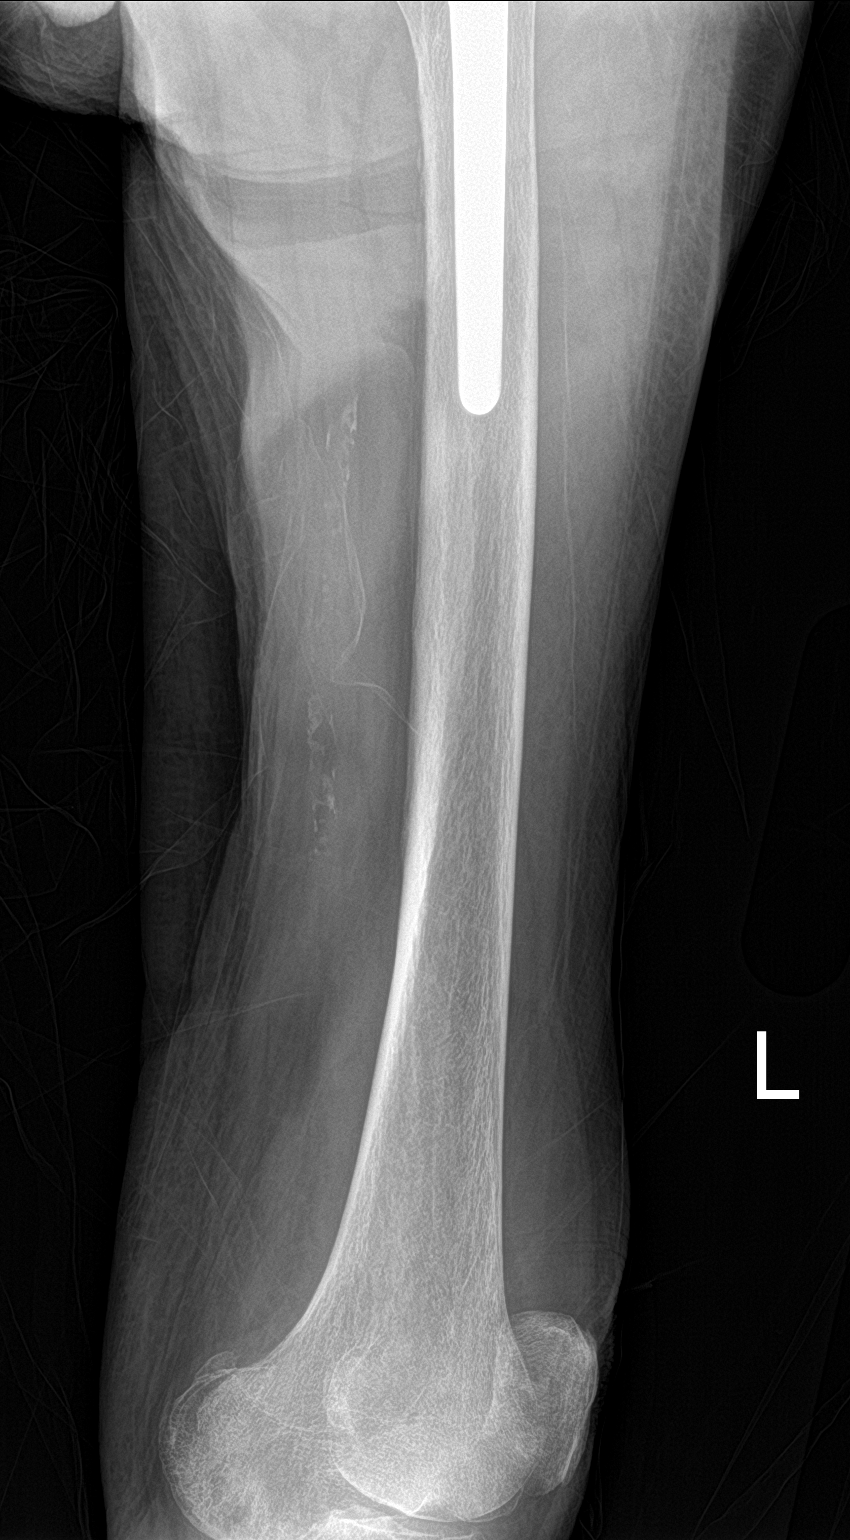

[4 of 4 positions shown; findings below may reference images not displayed]

FINDINGS: Status post left hip arthroplasty. There is interval development of
moderately displaced and comminuted periprosthetic fracture
involving lateral portion of intertrochanteric region of proximal
left femur. The middle and distal portions of the femur are
unremarkable. Vascular calcifications are noted.
IMPRESSION: Moderately displaced and comminuted periprosthetic fracture
involving intertrochanteric region of proximal left femur.

## 2021-02-05 IMAGING — CT CT OF THE LEFT HIP WITHOUT CONTRAST
2 of 3 series · 16 of 46 positions shown, 18 images · non-contrast
Comparison: Plain films left hip earlier today.

CLINICAL DATA: The patient suffered a periprosthetic left hip
fracture in a fall today. Initial encounter.

EXAM:
CT OF THE LEFT HIP WITHOUT CONTRAST
TECHNIQUE: Multidetector CT imaging of the left hip was performed according to
the standard protocol. Multiplanar CT image reconstructions were
also generated.

[Series 3: pelvis 2.0 st · axial · 0.53mm/px · z∈[+889,+1069]mm · 13 of 104 slices shown, 15 images]
[im 7/104  soft-tissue]
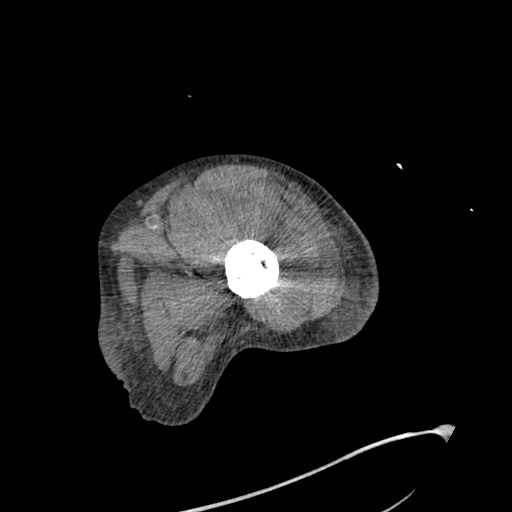
[im 7/104  bone]
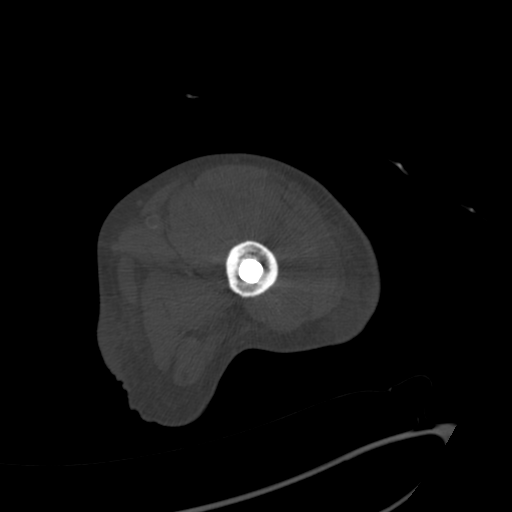
[im 14/104  soft-tissue]
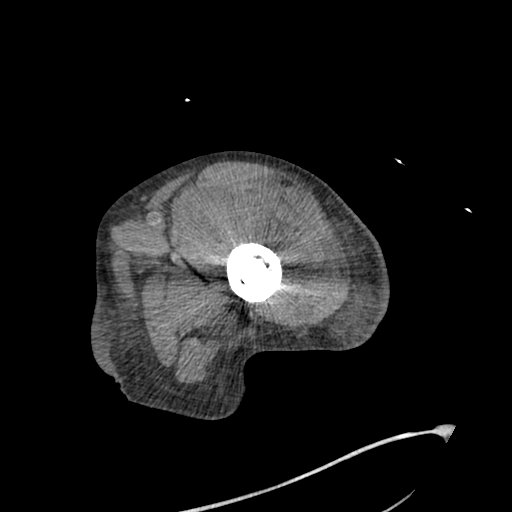
[im 20/104  soft-tissue]
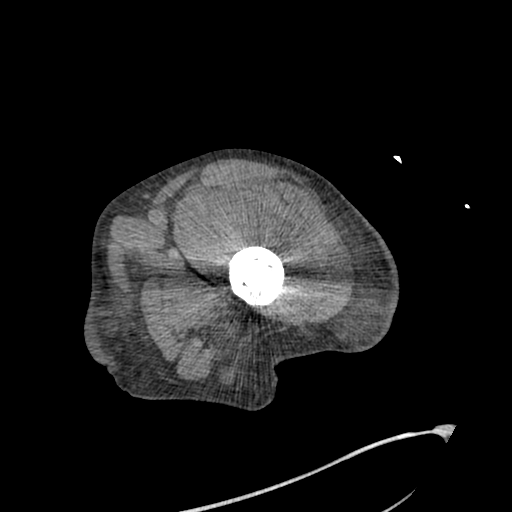
[im 30/104  soft-tissue]
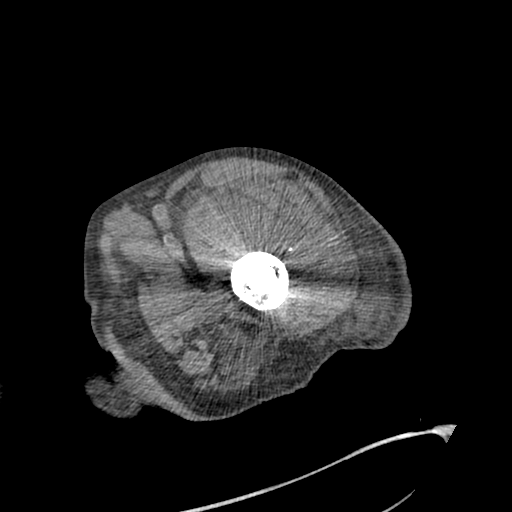
[im 37/104  soft-tissue]
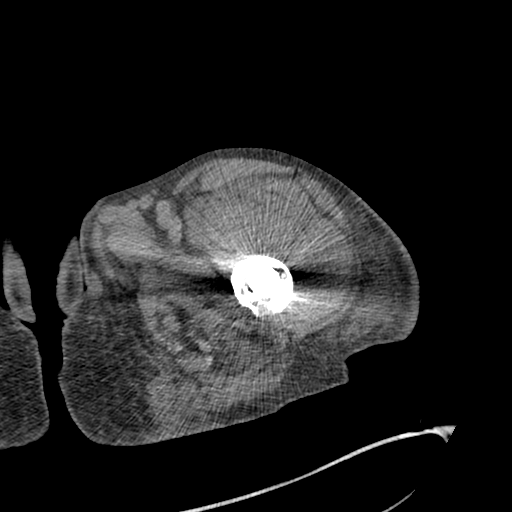
[im 44/104  soft-tissue]
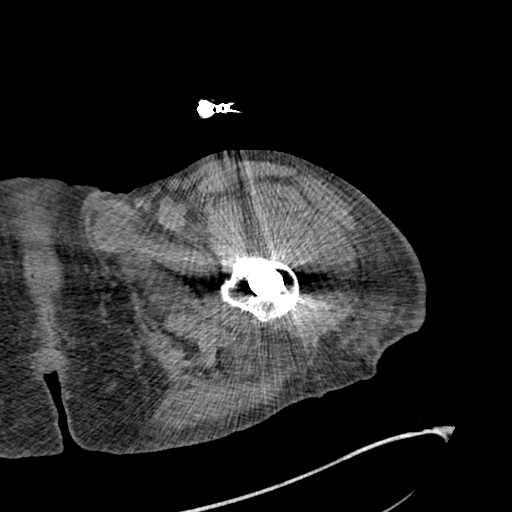
[im 54/104  soft-tissue]
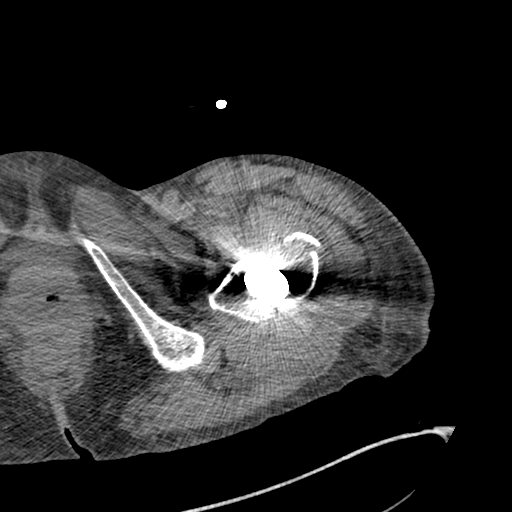
[im 60/104  soft-tissue]
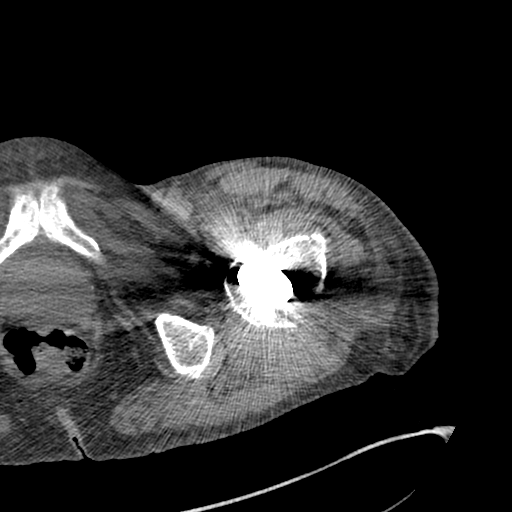
[im 67/104  soft-tissue]
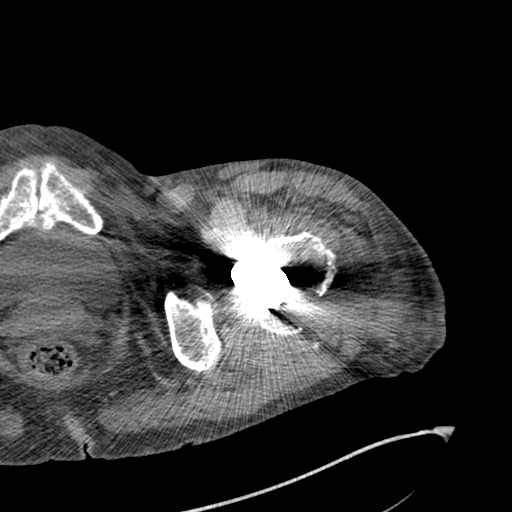
[im 67/104  bone]
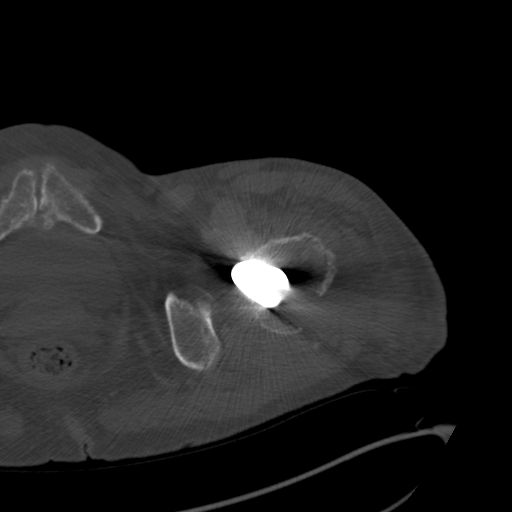
[im 74/104  soft-tissue]
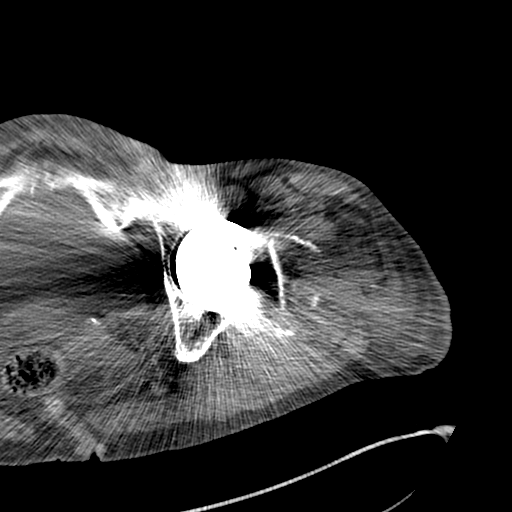
[im 84/104  soft-tissue]
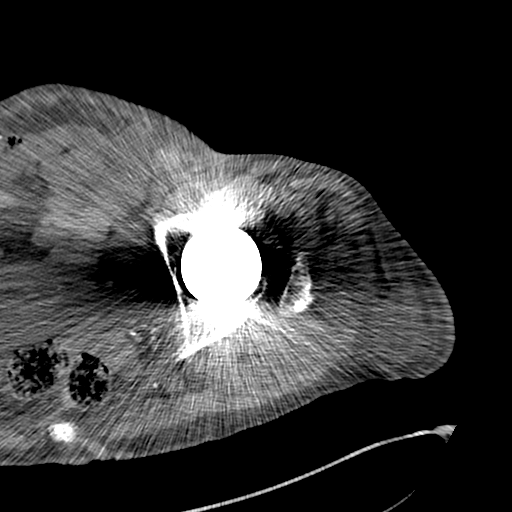
[im 90/104  soft-tissue]
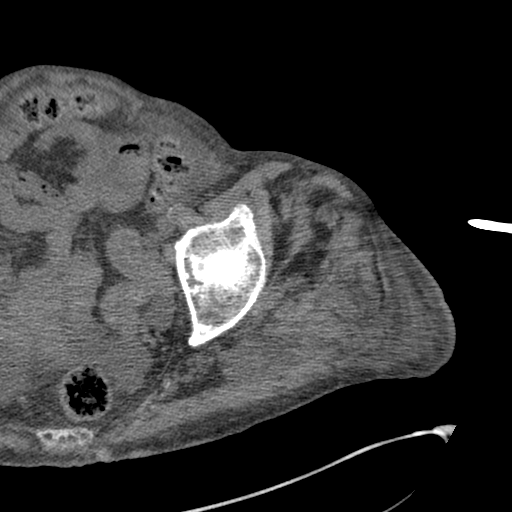
[im 97/104  soft-tissue]
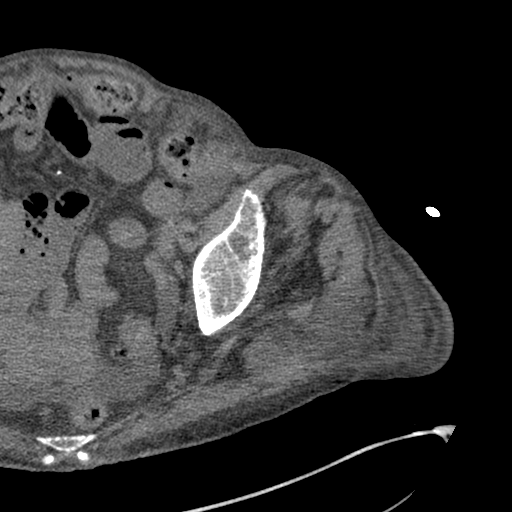

[Series 8: coronal st · coronal · 0.40mm/px · 3 of 96 slices shown]
[im 32/96  soft-tissue]
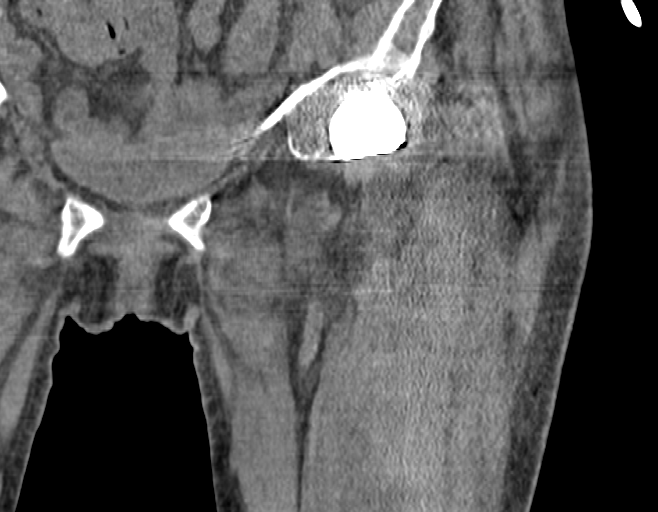
[im 43/96  soft-tissue]
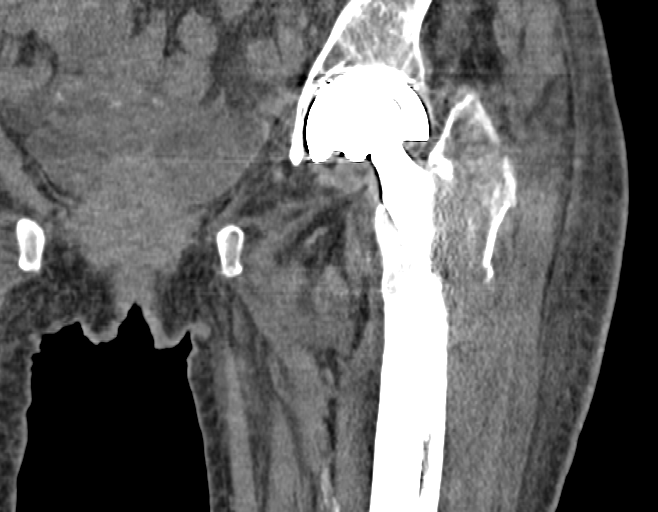
[im 53/96  soft-tissue]
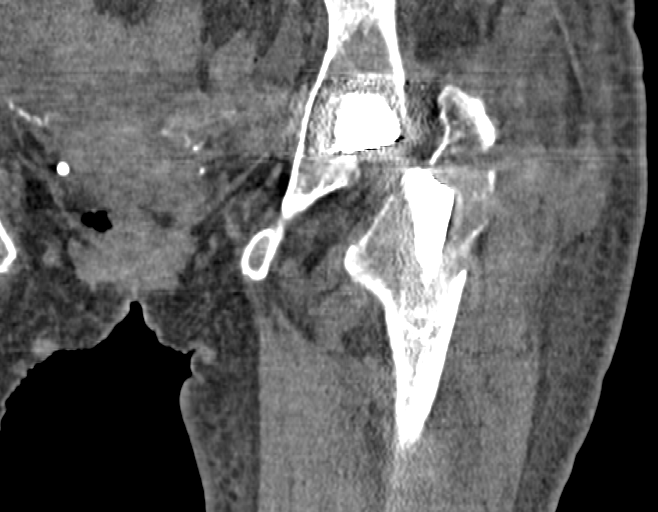

[16 of 46 positions shown; findings below may reference images not displayed]

FINDINGS: Bones/Joint/Cartilage

Left hip arthroplasty is in place. The device is located. The
patient has an acute fracture of the lateral aspect of the proximal
femur. The fracture extends from the top of the greater trochanter
7.5 cm inferiorly. The fracture fragment is divided into 2 pieces
and demonstrates lateral displacement of approximately 1 cm and
slight superior displacement. No other fracture seen.

Ligaments

Suboptimally assessed by CT.

Muscles and Tendons

Visualization is limited due to streak artifact.  Appear intact.

Soft tissues

Hematoma about the patient's fracture is noted. Imaged intrapelvic
contents demonstrate some free pelvic fluid. Diverticulosis is
noted.
IMPRESSION: Mildly displaced periprosthetic fracture of the left femur extends
approximately 7.5 cm craniocaudal from the top of the greater
trochanter as described above. No other fracture is identified.

Small volume of free pelvic fluid.

Diverticulosis.

## 2021-02-05 IMAGING — DX LEFT KNEE - COMPLETE 4+ VIEW
4 series · 4 of 4 positions shown · non-contrast
Comparison: None.

CLINICAL DATA: Left knee pain after a fall today. Initial
encounter.

EXAM:
LEFT KNEE - COMPLETE 4+ VIEW

[t knee ap left]
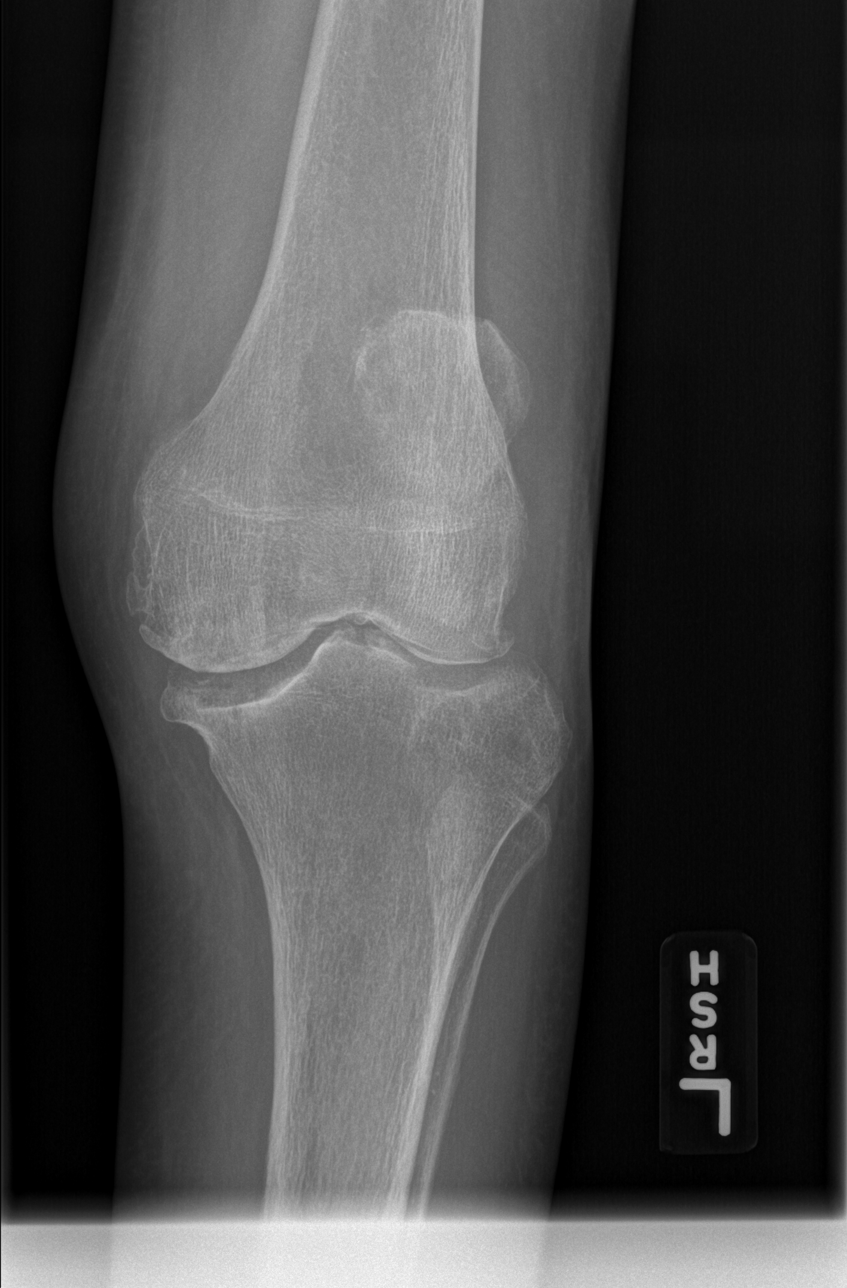

[t knee obl left]
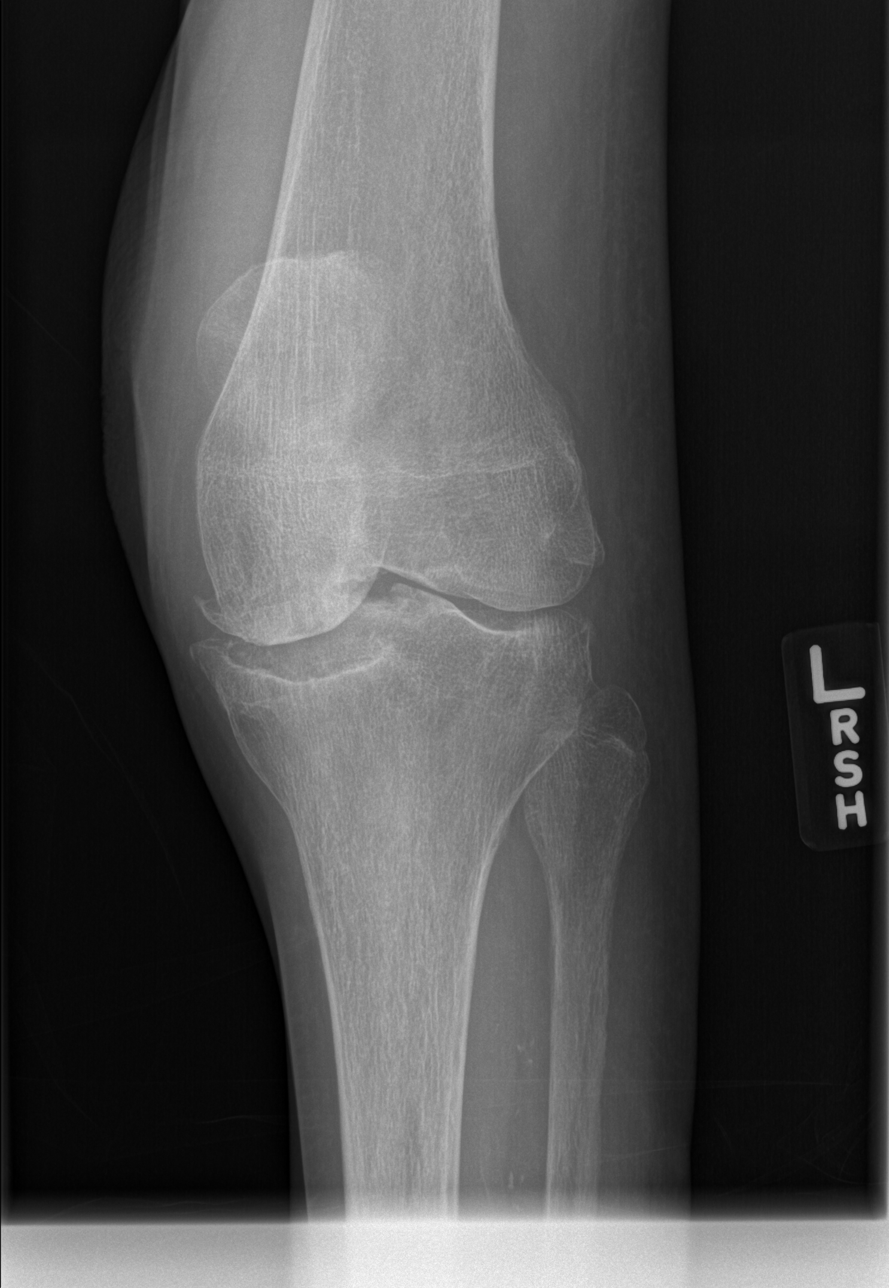

[t knee lat left]
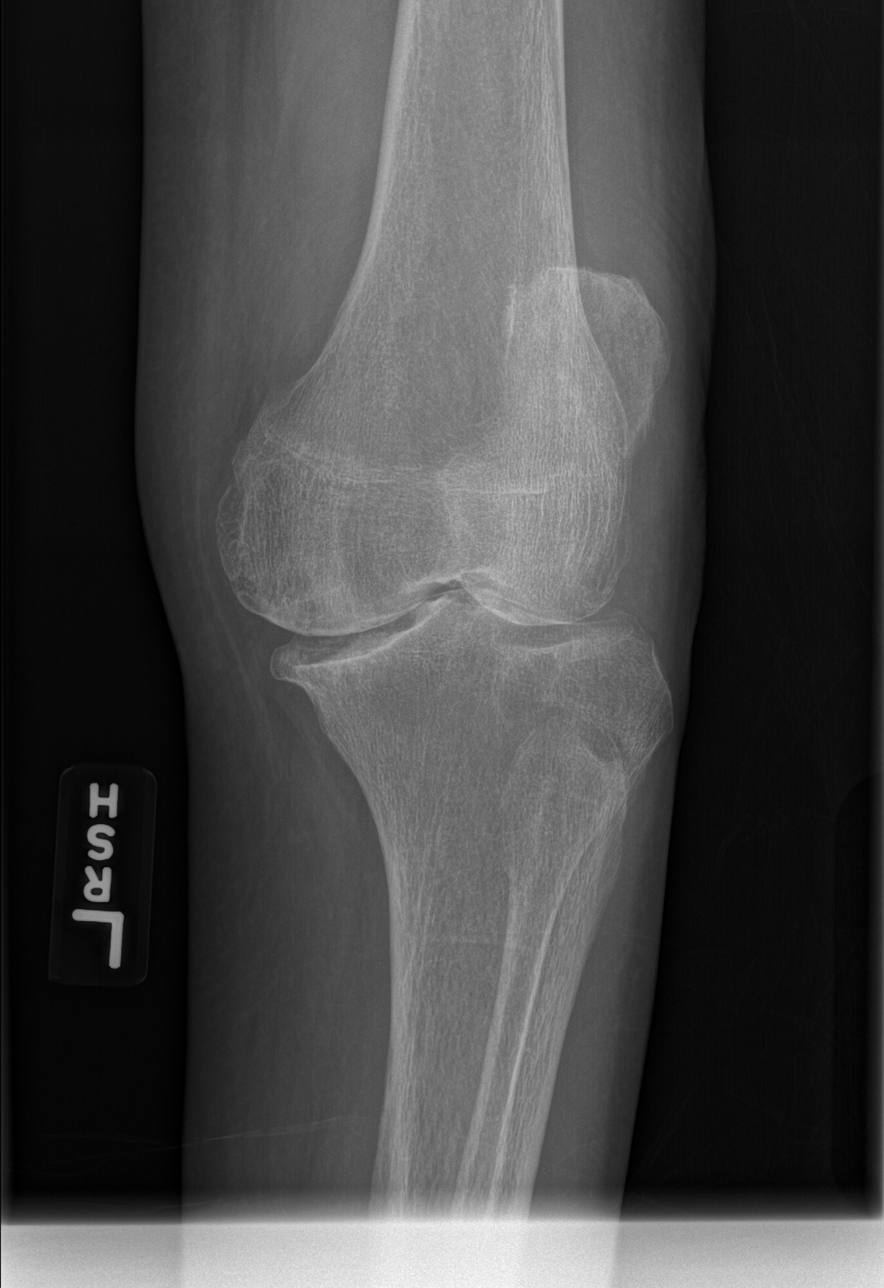

[x knee lat left]
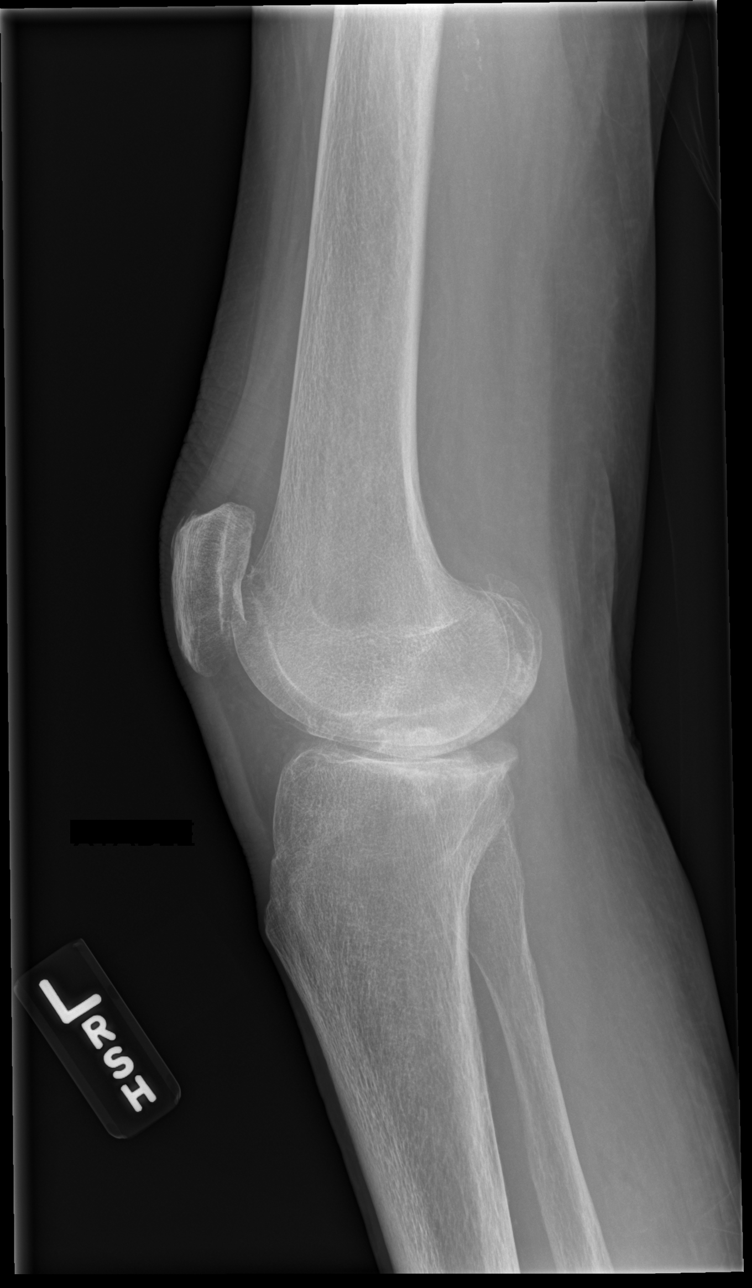

[4 of 4 positions shown; findings below may reference images not displayed]

FINDINGS: There is no acute bony or joint abnormality. Mild depression of the
medial tibial plateau is most consistent with remote fracture.
Degenerative change is present about the knee and worst in the
medial compartment. No joint effusion. No focal bony lesion.
IMPRESSION: No acute abnormality.

Remote mildly depressed medial tibial plateau fracture.

Tricompartmental osteoarthritis is worst medially.
